# Patient Record
Sex: Male | Born: 1940 | Race: White | Hispanic: No | Marital: Married | Smoking: Former smoker
Health system: Southern US, Community
[De-identification: ages and names within clinical notes are randomized; demographics above are authoritative.]

## PROBLEM LIST (undated history)

## (undated) DIAGNOSIS — I10 Essential (primary) hypertension: Secondary | ICD-10-CM

## (undated) DIAGNOSIS — H269 Unspecified cataract: Secondary | ICD-10-CM

## (undated) DIAGNOSIS — T148XXA Other injury of unspecified body region, initial encounter: Secondary | ICD-10-CM

## (undated) DIAGNOSIS — M1990 Unspecified osteoarthritis, unspecified site: Secondary | ICD-10-CM

## (undated) DIAGNOSIS — C801 Malignant (primary) neoplasm, unspecified: Secondary | ICD-10-CM

## (undated) DIAGNOSIS — C44601 Unspecified malignant neoplasm of skin of unspecified upper limb, including shoulder: Secondary | ICD-10-CM

## (undated) DIAGNOSIS — E785 Hyperlipidemia, unspecified: Secondary | ICD-10-CM

## (undated) DIAGNOSIS — I861 Scrotal varices: Secondary | ICD-10-CM

## (undated) DIAGNOSIS — M109 Gout, unspecified: Secondary | ICD-10-CM

## (undated) HISTORY — DX: Unspecified osteoarthritis, unspecified site: M19.90

## (undated) HISTORY — DX: Malignant (primary) neoplasm, unspecified: C80.1

## (undated) HISTORY — DX: Unspecified malignant neoplasm of skin of unspecified upper limb, including shoulder: C44.601

## (undated) HISTORY — DX: Essential (primary) hypertension: I10

## (undated) HISTORY — DX: Gout, unspecified: M10.9

## (undated) HISTORY — DX: Unspecified cataract: H26.9

## (undated) HISTORY — DX: Hyperlipidemia, unspecified: E78.5

## (undated) HISTORY — DX: Scrotal varices: I86.1

## (undated) HISTORY — DX: Other injury of unspecified body region, initial encounter: T14.8XXA

---

## 1978-11-13 HISTORY — PX: TREATMENT FISTULA ANAL: SUR1390

## 1999-03-23 ENCOUNTER — Other Ambulatory Visit: Admission: RE | Admit: 1999-03-23 | Discharge: 1999-03-23 | Payer: Self-pay | Admitting: Internal Medicine

## 2003-04-02 ENCOUNTER — Encounter: Payer: Self-pay | Admitting: Internal Medicine

## 2003-04-02 ENCOUNTER — Encounter: Admission: RE | Admit: 2003-04-02 | Discharge: 2003-04-02 | Payer: Self-pay | Admitting: Internal Medicine

## 2005-02-09 ENCOUNTER — Ambulatory Visit: Payer: Self-pay | Admitting: Internal Medicine

## 2005-02-10 ENCOUNTER — Ambulatory Visit: Payer: Self-pay | Admitting: Internal Medicine

## 2005-04-24 ENCOUNTER — Ambulatory Visit: Payer: Self-pay | Admitting: Internal Medicine

## 2005-05-01 ENCOUNTER — Ambulatory Visit: Payer: Self-pay | Admitting: Internal Medicine

## 2005-09-04 ENCOUNTER — Ambulatory Visit: Payer: Self-pay | Admitting: Internal Medicine

## 2005-11-13 DIAGNOSIS — C801 Malignant (primary) neoplasm, unspecified: Secondary | ICD-10-CM

## 2005-11-13 HISTORY — DX: Malignant (primary) neoplasm, unspecified: C80.1

## 2005-12-12 ENCOUNTER — Encounter: Admission: RE | Admit: 2005-12-12 | Discharge: 2005-12-12 | Payer: Self-pay | Admitting: Urology

## 2005-12-14 HISTORY — PX: PROSTATE SURGERY: SHX751

## 2005-12-22 ENCOUNTER — Ambulatory Visit (HOSPITAL_COMMUNITY): Admission: RE | Admit: 2005-12-22 | Discharge: 2005-12-22 | Payer: Self-pay | Admitting: Urology

## 2005-12-25 ENCOUNTER — Ambulatory Visit: Payer: Self-pay | Admitting: Internal Medicine

## 2005-12-29 ENCOUNTER — Ambulatory Visit: Payer: Self-pay

## 2006-01-10 ENCOUNTER — Inpatient Hospital Stay (HOSPITAL_COMMUNITY): Admission: RE | Admit: 2006-01-10 | Discharge: 2006-01-11 | Payer: Self-pay | Admitting: Urology

## 2006-01-10 ENCOUNTER — Encounter (INDEPENDENT_AMBULATORY_CARE_PROVIDER_SITE_OTHER): Payer: Self-pay | Admitting: Specialist

## 2006-04-12 ENCOUNTER — Ambulatory Visit: Payer: Self-pay | Admitting: Internal Medicine

## 2006-06-04 ENCOUNTER — Ambulatory Visit: Payer: Self-pay | Admitting: Internal Medicine

## 2007-05-27 ENCOUNTER — Ambulatory Visit: Payer: Self-pay | Admitting: Endocrinology

## 2007-08-01 ENCOUNTER — Ambulatory Visit: Payer: Self-pay | Admitting: Internal Medicine

## 2007-08-01 LAB — CONVERTED CEMR LAB
ALT: 31 units/L (ref 0–53)
AST: 34 units/L (ref 0–37)
Albumin: 4.1 g/dL (ref 3.5–5.2)
Basophils Absolute: 0 10*3/uL (ref 0.0–0.1)
Bilirubin Urine: NEGATIVE
Calcium: 9.6 mg/dL (ref 8.4–10.5)
Chloride: 105 meq/L (ref 96–112)
Cholesterol: 251 mg/dL (ref 0–200)
Eosinophils Absolute: 0.1 10*3/uL (ref 0.0–0.6)
GFR calc Af Amer: 109 mL/min
GFR calc non Af Amer: 90 mL/min
Hemoglobin, Urine: NEGATIVE
Ketones, ur: NEGATIVE mg/dL
MCHC: 34.3 g/dL (ref 30.0–36.0)
MCV: 87.1 fL (ref 78.0–100.0)
Monocytes Relative: 9.9 % (ref 3.0–11.0)
Nitrite: NEGATIVE
PSA: 0 ng/mL — ABNORMAL LOW (ref 0.10–4.00)
Platelets: 183 10*3/uL (ref 150–400)
RBC: 5.09 M/uL (ref 4.22–5.81)
Sodium: 142 meq/L (ref 135–145)
TSH: 1.64 microintl units/mL (ref 0.35–5.50)
Total CHOL/HDL Ratio: 5.7
Triglycerides: 234 mg/dL (ref 0–149)
Urine Glucose: NEGATIVE mg/dL

## 2007-08-13 ENCOUNTER — Encounter: Payer: Self-pay | Admitting: Internal Medicine

## 2007-08-13 ENCOUNTER — Ambulatory Visit: Payer: Self-pay | Admitting: Internal Medicine

## 2007-08-13 DIAGNOSIS — I10 Essential (primary) hypertension: Secondary | ICD-10-CM

## 2007-08-13 DIAGNOSIS — M109 Gout, unspecified: Secondary | ICD-10-CM

## 2007-08-13 DIAGNOSIS — E785 Hyperlipidemia, unspecified: Secondary | ICD-10-CM

## 2007-08-13 HISTORY — DX: Hyperlipidemia, unspecified: E78.5

## 2007-08-13 HISTORY — DX: Gout, unspecified: M10.9

## 2007-08-13 HISTORY — DX: Essential (primary) hypertension: I10

## 2007-12-31 ENCOUNTER — Encounter: Payer: Self-pay | Admitting: Internal Medicine

## 2008-06-30 ENCOUNTER — Encounter: Payer: Self-pay | Admitting: Internal Medicine

## 2008-07-23 ENCOUNTER — Ambulatory Visit: Payer: Self-pay | Admitting: Internal Medicine

## 2008-07-23 DIAGNOSIS — I861 Scrotal varices: Secondary | ICD-10-CM

## 2008-07-23 HISTORY — DX: Scrotal varices: I86.1

## 2008-07-23 LAB — CONVERTED CEMR LAB
ALT: 31 units/L (ref 0–53)
Albumin: 4.5 g/dL (ref 3.5–5.2)
BUN: 14 mg/dL (ref 6–23)
CO2: 29 meq/L (ref 19–32)
Calcium: 9.7 mg/dL (ref 8.4–10.5)
Cholesterol: 205 mg/dL (ref 0–200)
Creatinine, Ser: 0.8 mg/dL (ref 0.4–1.5)
Direct LDL: 99.3 mg/dL
Total Bilirubin: 1.1 mg/dL (ref 0.3–1.2)
Total CHOL/HDL Ratio: 4.1
Total Protein: 7.7 g/dL (ref 6.0–8.3)
Triglycerides: 250 mg/dL (ref 0–149)

## 2008-07-27 ENCOUNTER — Encounter: Payer: Self-pay | Admitting: Internal Medicine

## 2008-08-03 ENCOUNTER — Encounter: Admission: RE | Admit: 2008-08-03 | Discharge: 2008-08-03 | Payer: Self-pay | Admitting: Internal Medicine

## 2008-08-04 ENCOUNTER — Telehealth: Payer: Self-pay | Admitting: Internal Medicine

## 2008-08-04 ENCOUNTER — Encounter: Payer: Self-pay | Admitting: Internal Medicine

## 2008-08-06 ENCOUNTER — Encounter: Payer: Self-pay | Admitting: Internal Medicine

## 2008-08-13 ENCOUNTER — Encounter: Admission: RE | Admit: 2008-08-13 | Discharge: 2008-08-13 | Payer: Self-pay | Admitting: Internal Medicine

## 2008-08-17 ENCOUNTER — Encounter: Payer: Self-pay | Admitting: Internal Medicine

## 2008-10-21 ENCOUNTER — Encounter: Payer: Self-pay | Admitting: Internal Medicine

## 2008-10-23 ENCOUNTER — Encounter: Payer: Self-pay | Admitting: Internal Medicine

## 2008-11-13 HISTORY — PX: HERNIA REPAIR: SHX51

## 2008-11-23 ENCOUNTER — Encounter: Payer: Self-pay | Admitting: Internal Medicine

## 2009-01-01 ENCOUNTER — Encounter: Payer: Self-pay | Admitting: Internal Medicine

## 2009-01-07 ENCOUNTER — Ambulatory Visit (HOSPITAL_COMMUNITY): Admission: RE | Admit: 2009-01-07 | Discharge: 2009-01-07 | Payer: Self-pay | Admitting: General Surgery

## 2009-01-25 ENCOUNTER — Encounter: Payer: Self-pay | Admitting: Internal Medicine

## 2009-02-04 ENCOUNTER — Encounter: Payer: Self-pay | Admitting: Internal Medicine

## 2009-07-07 ENCOUNTER — Encounter: Payer: Self-pay | Admitting: Internal Medicine

## 2009-08-30 ENCOUNTER — Telehealth: Payer: Self-pay | Admitting: Internal Medicine

## 2009-08-31 ENCOUNTER — Telehealth: Payer: Self-pay | Admitting: Internal Medicine

## 2009-09-30 ENCOUNTER — Ambulatory Visit: Payer: Self-pay | Admitting: Internal Medicine

## 2009-09-30 LAB — CONVERTED CEMR LAB
ALT: 41 units/L (ref 0–53)
AST: 40 units/L — ABNORMAL HIGH (ref 0–37)
Alkaline Phosphatase: 61 units/L (ref 39–117)
BUN: 12 mg/dL (ref 6–23)
Bilirubin, Direct: 0.2 mg/dL (ref 0.0–0.3)
Calcium: 9.8 mg/dL (ref 8.4–10.5)
Cholesterol: 196 mg/dL (ref 0–200)
Creatinine, Ser: 1 mg/dL (ref 0.4–1.5)
GFR calc non Af Amer: 78.93 mL/min (ref >60)
Glucose, Bld: 91 mg/dL (ref 70–99)
TSH: 1.25 microintl units/mL (ref 0.35–5.50)
Total Bilirubin: 1.1 mg/dL (ref 0.3–1.2)
Total Protein: 7.4 g/dL (ref 6.0–8.3)
VLDL: 28 mg/dL (ref 0.0–40.0)

## 2010-01-25 ENCOUNTER — Encounter: Payer: Self-pay | Admitting: Internal Medicine

## 2010-04-07 ENCOUNTER — Encounter: Payer: Self-pay | Admitting: Internal Medicine

## 2010-07-14 HISTORY — PX: COLONOSCOPY: SHX174

## 2010-07-15 ENCOUNTER — Encounter (INDEPENDENT_AMBULATORY_CARE_PROVIDER_SITE_OTHER): Payer: Self-pay

## 2010-07-19 ENCOUNTER — Ambulatory Visit: Payer: Self-pay | Admitting: Internal Medicine

## 2010-07-27 ENCOUNTER — Encounter: Payer: Self-pay | Admitting: Internal Medicine

## 2010-07-28 ENCOUNTER — Ambulatory Visit: Payer: Self-pay | Admitting: Internal Medicine

## 2010-07-28 LAB — HM COLONOSCOPY

## 2010-07-29 ENCOUNTER — Encounter: Payer: Self-pay | Admitting: Internal Medicine

## 2010-08-30 ENCOUNTER — Ambulatory Visit: Payer: Self-pay | Admitting: Internal Medicine

## 2010-10-03 ENCOUNTER — Encounter: Payer: Self-pay | Admitting: Internal Medicine

## 2010-10-03 ENCOUNTER — Ambulatory Visit: Payer: Self-pay | Admitting: Internal Medicine

## 2010-10-05 ENCOUNTER — Ambulatory Visit (HOSPITAL_COMMUNITY)
Admission: RE | Admit: 2010-10-05 | Discharge: 2010-10-05 | Payer: Self-pay | Source: Home / Self Care | Admitting: Internal Medicine

## 2010-10-08 ENCOUNTER — Encounter: Payer: Self-pay | Admitting: Internal Medicine

## 2010-12-04 ENCOUNTER — Encounter: Payer: Self-pay | Admitting: General Surgery

## 2010-12-13 NOTE — Letter (Signed)
   [facility] [address] [place], [place]  [address] Phone: [phone]      October 09, 2010   DREDEN RIVERE [address] [place], [place] [address]  RE:  LAB RESULTS  Dear  Mr. Snelling,  The following is an interpretation of your most recent lab tests.  Please take note of any instructions provided or changes to medications that have resulted from your lab work.    CT scan of neck and abdomen/pelvis were normal. Full report attached.  Please come see me if you have any questions about these lab results.   Sincerely Yours,    Jacques Navy MD  T ABD/PELVIS WO CM - [phone]   Clinical Data:  Right neck pain and swelling.  Prostate cancer.   CT NECK WITH CONTRAST   Technique:  Multidetector CT imaging of the neck was performed using the standard protocol following the bolus administration of intravenous contrast.   Contrast: 100 ml Omnipaque-300 IV   Comparison:  None   Findings: Negative for mass lesion.  Negative for adenopathy. Submandibular and parotid glands are normal bilaterally.  No soft tissue edema or abscess is present.  Pharyngeal soft tissues are normal.  Larynx and thyroid are normal.  Lung apices are clear with azygos lobe fissure noted.  Cervical disc degeneration and spondylosis.  No acute bony abnormality.   IMPRESSION: No significant abnormality.  No mass or adenopathy.     Clinical Data:  Evaluate kidney stones.  Right flank pain and hematuria.  Prostate cancer.   CT ABDOMEN AND PELVIS WITHOUT CONTRAST   Technique:  Multidetector CT imaging of the abdomen and pelvis was performed following the standard protocol without intravenous contrast.   Comparison:  None.   Findings:  Negative for urinary tract calculi.  No hydronephrosis or renal mass.   Lung bases are clear.  Liver and gallbladder normal.  Pancreas and spleen are normal.   Negative for bowel obstruction.  The appendix is normal.  No free fluid  or adenopathy.   Prostatectomy is noted.   IMPRESSION: Negative for renal calculi.  No acute abnormality.   Read By:  Camelia Phenes,  Judie Petit.D.

## 2010-12-13 NOTE — Assessment & Plan Note (Signed)
Summary: GOLF BALL HIT LEG--BRUISE MOVE DOWN TO FOOT--STC   Vital Signs:  Patient profile:   70 year old male Height:      70 inches Weight:      201 pounds BMI:     28.94 O2 Sat:      98 % on Room air Temp:     97.8 degrees F oral Pulse rate:   71 / minute BP sitting:   138 / 82  (left arm) Cuff size:   regular  Vitals Entered By: Bill Salinas CMA (August 30, 2010 4:04 PM)  O2 Flow:  Room air CC: pt here for evaluation of bruising that is moving down his left leg after being hit with a golf ball/a b   Primary Care Provider:  Norins  CC:  pt here for evaluation of bruising that is moving down his left leg after being hit with a golf ball/a b.  History of Present Illness: Patient is seen acutely due to bruising of the left ankle. He was struck by a golf ball left mid-shin sustaining a hematoma with subsequent bruising in the surrounding tissue. Today he noticed bruising at the medial aspect of his ankle. His concern is for DVT or other serious injury. He has minimal pain, no swelling in the calve, normal range of motion ankle.  Current Medications (verified): 1)  Prinivil 10 Mg  Tabs (Lisinopril) .... Once Daily 2)  Aspirin 81 Mg  Tbec (Aspirin) .... Once Daily 3)  Multivitamins   Tabs (Multiple Vitamin) .... Once Daily 4)  Ibuprofen 200 Mg  Caps (Ibuprofen) .... As Needed 5)  Indomethacin 25 Mg Caps (Indomethacin) .Marland Kitchen.. 1 Q 8 Hrs As Needed Gout Pain / Needs Office Visit 6)  Lovastatin 20 Mg  Tabs (Lovastatin) .Marland Kitchen.. 1 By Mouth Once Daily  Allergies (verified): 1)  ! Iodine PMH-FH-SH reviewed-no changes except otherwise noted  Review of Systems  The patient denies fever, chest pain, dyspnea on exertion, muscle weakness, suspicious skin lesions, difficulty walking, and enlarged lymph nodes.    Physical Exam  General:  Well-developed,well-nourished,in no acute distress; alert,appropriate and cooperative throughout examination Msk:  left leg appears normal Skin:  palpable  hematoma left shin with surrounding old bruise. Blue echymosis at medial aspect of left ankle.   Impression & Recommendations:  Problem # 1:  CONTUSION, LOWER LEG, LEFT (ICD-924.10) blunt trauma by golf ball with hematoma and bruising. Now with extension by dissection of skin plane to the ankle. Neg Homan's sign.  Plan - reassurance           heat to bruised area.  Complete Medication List: 1)  Prinivil 10 Mg Tabs (Lisinopril) .... Once daily 2)  Aspirin 81 Mg Tbec (Aspirin) .... Once daily 3)  Multivitamins Tabs (Multiple vitamin) .... Once daily 4)  Ibuprofen 200 Mg Caps (Ibuprofen) .... As needed 5)  Indomethacin 25 Mg Caps (Indomethacin) .Marland Kitchen.. 1 q 8 hrs as needed gout pain / needs office visit 6)  Lovastatin 20 Mg Tabs (Lovastatin) .Marland Kitchen.. 1 by mouth once daily   Orders Added: 1)  Est. Patient Level II [540[address]]

## 2010-12-13 NOTE — Letter (Signed)
Summary: [facility] Instructions  [facility]  [address] [place], [place] [address]   Phone: [phone]  Fax: [phone]       Troy Jenkins    03-Mar-1941    MRN: [id number]        Procedure Day /Date:  Thursday 07/28/2010     Arrival Time: 9:00 am      Procedure Time: 10:00 am     Location of Procedure:                    _x _  [facility] (4th Floor)                        PREPARATION FOR COLONOSCOPY WITH MOVIPREP   Starting 5 days prior to your procedure Saturday 9/10 do not eat nuts, seeds, popcorn, corn, beans, peas,  salads, or any raw vegetables.  Do not take any fiber supplements (e.g. Metamucil, Citrucel, and Benefiber).  THE DAY BEFORE YOUR PROCEDURE         DATE: Wednesday 9/14  1.  Drink clear liquids the entire day-NO SOLID FOOD  2.  Do not drink anything colored red or purple.  Avoid juices with pulp.  No orange juice.  3.  Drink at least 64 oz. (8 glasses) of fluid/clear liquids during the day to prevent dehydration and help the prep work efficiently.  CLEAR LIQUIDS INCLUDE: Water Jello Ice Popsicles Tea (sugar ok, no milk/cream) Powdered fruit flavored drinks Coffee (sugar ok, no milk/cream) Gatorade Juice: apple, white grape, white cranberry  Lemonade Clear bullion, consomm, broth Carbonated beverages (any kind) Strained chicken noodle soup Hard Candy                             4.  In the morning, mix first dose of MoviPrep solution:    Empty 1 Pouch A and 1 Pouch B into the disposable container    Add lukewarm drinking water to the top line of the container. Mix to dissolve    Refrigerate (mixed solution should be used within 24 hrs)  5.  Begin drinking the prep at 5:00 p.m. The MoviPrep container is divided by 4 marks.   Every 15 minutes drink the solution down to the next mark (approximately 8 oz) until the full liter is complete.   6.  Follow completed prep with 16 oz of clear liquid of your choice  (Nothing red or purple).  Continue to drink clear liquids until bedtime.  7.  Before going to bed, mix second dose of MoviPrep solution:    Empty 1 Pouch A and 1 Pouch B into the disposable container    Add lukewarm drinking water to the top line of the container. Mix to dissolve    Refrigerate  THE DAY OF YOUR PROCEDURE      DATE: Thursday 9/15  Beginning at 5:00 a.m. (5 hours before procedure):         1. Every 15 minutes, drink the solution down to the next mark (approx 8 oz) until the full liter is complete.  2. Follow completed prep with 16 oz. of clear liquid of your choice.    3. You may drink clear liquids until 8:00 am (2 HOURS BEFORE PROCEDURE).   MEDICATION INSTRUCTIONS  Unless otherwise instructed, you should take regular prescription medications with a small sip of water   as early as possible the morning of  your procedure.          OTHER INSTRUCTIONS  You will need a responsible adult at least 70 years of age to accompany you and drive you home.   This person must remain in the waiting room during your procedure.  Wear loose fitting clothing that is easily removed.  Leave jewelry and other valuables at home.  However, you may wish to bring a book to read or  an iPod/MP3 player to listen to music as you wait for your procedure to start.  Remove all body piercing jewelry and leave at home.  Total time from sign-in until discharge is approximately 2-3 hours.  You should go home directly after your procedure and rest.  You can resume normal activities the  day after your procedure.  The day of your procedure you should not:   Drive   Make legal decisions   Operate machinery   Drink alcohol   Return to work  You will receive specific instructions about eating, activities and medications before you leave.    The above instructions have been reviewed and explained to me by   Ulis Rias RN  July 19, 2010 9:19 AM     I fully understand  and can verbalize these instructions _____________________________ Date _________

## 2010-12-13 NOTE — Assessment & Plan Note (Signed)
Summary: CPX-LB   Vital Signs:  Patient profile:   70 year old male Height:      70 inches Weight:      200 pounds BMI:     28.80 Temp:     97.9 degrees F oral Pulse rate:   64 / minute Pulse rhythm:   regular BP sitting:   156 / 92  (left arm) Cuff size:   regular  Vitals Entered By: Lamar Sprinkles, CMA (October 03, 2010 9:44 AM) CC: Medicare Wellness   Primary Care Provider:  Norins  CC:  Medicare Wellness.  History of Present Illness: Patient presents for a medicare wellness exam. He has several concerns.  He has a posterior cerivcal chain lymph node that is chronically tender and has not receeded over several months. He is interested in having this worked up. He has had no night sweats, no weight loss, no other lymphadenopathy.  May 26th - seen at the [place]. On the way there he had severe pain in the low back right. He did have a lot of blood in the urine that day. He had another episode of pain 2 weeks ago. This feels like previous kidney stone.  Has a knot on the left scapula - has been there for a while, non tender.   Patient is 100% indepnendent in all ADLs. 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 He has had no falls and has no fall risk. He has no signs or symptoms of depression.    Preventive Screening-Counseling & Management  Alcohol-Tobacco     Alcohol drinks/day: <1     Alcohol type: wine     >5/day in last 3 mos: no     Smoking Status: quit > 6 months     Packs/Day: 1.0     Year Quit: 1969  Caffeine-Diet-Exercise     Caffeine use/day: 2.5     Diet Comments: heart healthy diet     Diet Counseling: not indicated; diet is assessed to be healthy     Does Patient Exercise: yes     Type of exercise: golf, walks     Exercise (avg: min/session): >60     Times/week: 3     Exercise Counseling: not indicated; exercise is adequate  Hep-HIV-STD-Contraception     Hepatitis Risk: no risk noted     HIV  Risk: no risk noted     STD Risk: no risk noted     Dental Visit-last 6 months yes     Sun Exposure-Excessive: yes  Safety-Violence-Falls     Seat Belt Use: yes     Helmet Use: yes     Firearms in the Home: firearms in the home     Smoke Detectors: yes     Violence in the Home: no risk noted     Fall Risk: NO      Sexual History:  currently monogamous.        Drug Use:  never.        Blood Transfusions:  no.    Current Medications (verified): 1)  Prinivil 10 Mg  Tabs (Lisinopril) .... Once Daily 2)  Aspirin 81 Mg  Tbec (Aspirin) .... Once Daily 3)  Multivitamins   Tabs (Multiple Vitamin) .... Once Daily 4)  Ibuprofen 200 Mg  Caps (Ibuprofen) .... As Needed 5)  Indomethacin 25 Mg Caps (Indomethacin) .Marland Kitchen.. 1 Q 8 Hrs As Needed Gout Pain / Needs Office Visit 6)  Lovastatin 20 Mg  Tabs (Lovastatin) .Marland Kitchen.. 1 By Mouth Once Daily  Allergies (verified): 1)  ! 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  Social History: Smoking Status:  quit > 6 months Packs/Day:  1.0 Caffeine use/day:  2.5 Does Patient Exercise:  yes Dental Care w/in 6 mos.:  yes Sun Exposure-Excessive:  yes Seat Belt Use:  yes Fall Risk:  NO Hepatitis Risk:  no risk noted HIV Risk:  no risk noted STD Risk:  no risk noted Sexual History:  currently monogamous Drug Use:  never Blood Transfusions:  no  Review of Systems  The patient denies anorexia, fever, weight loss, weight gain, vision loss, hoarseness,  chest pain, syncope, peripheral edema, prolonged cough, hemoptysis, abdominal pain, hematochezia, incontinence, muscle weakness, difficulty walking, abnormal bleeding, enlarged lymph nodes, and angioedema.    Physical Exam  General:  Well-developed,well-nourished,in no acute distress; alert,appropriate and cooperative throughout examination Head:  Normocephalic and atraumatic without obvious abnormalities. No apparent alopecia or balding. Eyes:  No corneal or conjunctival inflammation noted. EOMI. Perrla. Funduscopic exam benign, without hemorrhages, exudates or papilledema. Vision grossly normal. Ears:  External ear exam shows no significant lesions or deformities.  Otoscopic examination reveals clear canals, tympanic membranes are intact bilaterally without bulging, retraction, inflammation or discharge. Hearing is grossly normal bilaterally. Nose:  no external deformity and no external erythema.   Mouth:  Oral mucosa and oropharynx without lesions or exudates.  Teeth in good repair. Neck:  supple, full ROM, no thyromegaly, and no carotid bruits.   Chest Wall:  No deformities, masses, tenderness or gynecomastia noted. Lungs:  Normal respiratory effort, chest expands symmetrically. Lungs are clear to auscultation, no crackles or wheezes. Heart:  Normal rate and regular rhythm. S1 and S2 normal without gallop, murmur, click, rub or other extra sounds. Abdomen:  soft, non-tender, normal bowel sounds, no distention, no masses, no guarding, and no rigidity. No tenderness over the flanks.   Prostate:  deferred to prostatectomy Msk:  normal ROM, no joint tenderness, no joint swelling, no joint warmth, and no joint instability.   Pulses:  2+ radial and DPD pulses Extremities:  No clubbing, cyanosis, edema, or deformity noted with normal full range of motion of all joints.   Neurologic:  alert & oriented X3, cranial nerves II-XII intact, strength normal in all extremities, gait normal, and DTRs  symmetrical and normal.   Skin:  turgor normal, color normal, no rashes, no suspicious lesions, and no ulcerations.  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 No additional adenopathy appreciated.  Axillary Nodes:  no R axillary adenopathy and no L axillary adenopathy.   Psych:  Oriented X3, memory intact for recent and remote, normally interactive, good eye contact, and not anxious appearing.     Impression & Recommendations:  Problem # 1:  ACUTE LYMPHADENITIS (ICD-683) Patient with a persistently enlarged lymph node posteror cervical chain right. He has had no night sweats, weight loss or other systemic symptoms.  Plan - CT neck  f/u with  ENT for possible needle or excisional biopsy. Orders: Radiology Referral (Radiology)  Problem # 2:  FLANK PAIN, RIGHT (ICD-789.09) Patient with a h/o nephrolithiasis. HE has had two episodoes of renal colic type pain on the right and hematuria.  Plan - CT abd/pelvis with kidney stone protocol.           May refer to GU if stone present.   His updated medication list for this problem includes:    Aspirin 81 Mg Tbec (Aspirin) ..... Once daily    Ibuprofen 200 Mg Caps (Ibuprofen) .Marland Kitchen... As needed    Indomethacin 25 Mg Caps (Indomethacin) .Marland Kitchen... 1 q 8 hrs as needed gout pain / needs office visit  Problem # 3:  HYPERTENSION (ICD-401.9)  His updated medication list for this problem includes:    Prinivil 10 Mg Tabs (Lisinopril) ..... Once daily  BP today: 156/92 Prior BP: 138/82 (08/30/2010)  Patient has been previously well controlled.  Plan - continue present medication           monitor BP at home and report back. Recommendations to follow  Problem # 4:  GOUT (ICD-274.9) Patient did have a flare of knee pain. He was treated with colchicine. He has not had a recent uric acid level. He has not had more than 2 episodes in 12  months.  Plan - treat acute flares with NSAIDs          for continue flares will need uric acid level and consider allopurinol or Uloric.   Problem # 5:  HYPERLIPIDEMIA (ICD-272.4)  His updated medication list for this problem includes:    Lovastatin 20 Mg Tabs (Lovastatin) .Marland Kitchen... 1 by mouth once daily  Good control on present dose of miedication. Will contginue the same.   Problem # 6:  Preventive Health Care (ICD-V70.0)  Interval health history is remarkable for episodes of flank pain as noted which will be evaluated as described. 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 He is current with colorectal cancer screening with last colonoscopy Sept '11. Immunizatrions - tetnus Nov '120. He is given a prescription for Shingles vaccine. He is a candidate for pneumonia vaccine if not done already. Reveiwed labs from [place] which were all normal including lipid panel, A1C. 12 Lead EKG is negative of ischemia or injury.   In summary - a very nice gentleman who appears medically stable but needs further evaluation of lymphadenitis and of potential renal calculus. He will be notified of his test results when available.   Orders: Medicare -1st Annual Wellness Visit [phone])  Complete Medication List: 1)  Prinivil 10 Mg Tabs (Lisinopril) .... Once daily 2)  Aspirin 81 Mg Tbec (Aspirin) .... Once daily 3)  Multivitamins Tabs (Multiple vitamin) .... Once daily 4)  Ibuprofen 200 Mg Caps (Ibuprofen) .... As needed 5)  Indomethacin 25 Mg Caps (Indomethacin) .Marland Kitchen.. 1 q 8 hrs as needed gout pain / needs office visit 6)  Lovastatin 20 Mg Tabs (Lovastatin) .Marland Kitchen.. 1 by mouth once daily Prescriptions: LOVASTATIN 20 MG  TABS (LOVASTATIN) 1 by mouth once daily  #90 x 3   Entered and Authorized by:   Jacques Navy MD   Signed by:   Jacques Navy MD on 10/03/2010   Method used:   Electronically to        [facility] [address] [facility] (retail)       [address]  [place], [place]  [address]       Ph: [phone]       Fax: [phone]   RxID:   [id number] INDOMETHACIN 25 MG CAPS (INDOMETHACIN) 1 q 8 hrs as needed gout pain / NEEDS office visit  #30 x 12   Entered and Authorized by:   Jacques Navy MD   Signed by:   Jacques Navy MD on 10/03/2010   Method used:   Electronically to        [facility] [address] [facility] (retail)       [address]       [place], [place]  [address]       Ph: [phone]       Fax: [phone]   RxID:   [id number] PRINIVIL 10 MG  TABS (LISINOPRIL) once daily  #90 Each x 3   Entered and Authorized by:   Jacques Navy MD   Signed by:   Jacques Navy MD on 10/03/2010   Method used:   Electronically to        [facility] [address] [facility] (retail)       [address]       [place], [place]  [address]       Ph: [phone]       Fax: [phone]   RxID:   [id number]    Orders Added: 1)  Radiology Referral [Radiology] 2)  Radiology Referral [Radiology] 3)  Medicare -1st Annual Wellness Visit [G0438] 4)  Est. Patient Level III [269[address]]

## 2010-12-13 NOTE — Miscellaneous (Signed)
Summary: Lec previsit  Clinical Lists Changes  Medications: Added new medication of MOVIPREP 100 GM  SOLR (PEG-KCL-NACL-NASULF-NA ASC-C) As per prep instructions. - Signed Rx of MOVIPREP 100 GM  SOLR (PEG-KCL-NACL-NASULF-NA ASC-C) As per prep instructions.;  #1 x 0;  Signed;  Entered by: Ulis Rias RN;  Authorized by: Hilarie Fredrickson MD;  Method used: Electronically to [facility] [address], [place], [place], [place]  [address], Ph: [phone], Fax: [phone] Observations: Added new observation of ALLERGY REV: Done (07/19/2010 8:45)    Prescriptions: MOVIPREP 100 GM  SOLR (PEG-KCL-NACL-NASULF-NA ASC-C) As per prep instructions.  #1 x 0   Entered by:   Ulis Rias RN   Authorized by:   Hilarie Fredrickson MD   Signed by:   Ulis Rias RN on 07/19/2010   Method used:   Electronically to        [facility] (retail)       [address]       [place], [place]  [address]       Ph: [phone]       Fax: [phone]   RxID:   [id number]

## 2010-12-13 NOTE — Procedures (Signed)
Summary: Colonoscopy  Patient: Ameya Kutz Note: All result statuses are Final unless otherwise noted.  Tests: (1) Colonoscopy (COL)   COL Colonoscopy           DONE     [facility]     [address]     [place], [place]  [address]           COLONOSCOPY PROCEDURE REPORT           PATIENT:  Clancey, Welton  MR#:  [id number]     BIRTHDATE:  06-12-1941, 69 yrs. old  GENDER:  male     ENDOSCOPIST:  Wilhemina Bonito. Eda Keys, MD     REF. BY:  Screening Recall     PROCEDURE DATE:  07/28/2010     PROCEDURE:  Colonoscopy with snare polypectomy x 1     ASA CLASS:  Class II     INDICATIONS:  Routine Risk Screening ; negative index exam 2000     MEDICATIONS:   Fentanyl 75 mcg IV, Versed 9 mg IV           DESCRIPTION OF PROCEDURE:   After the risks benefits and     alternatives of the procedure were thoroughly explained, informed     consent was obtained.  Digital rectal exam was performed and     revealed no abnormalities.   The LB CF-[id number] endoscope     was introduced through the anus and advanced to the cecum, which     was identified by both the appendix and ileocecal valve, without     limitations.Time to cecum = 3:03 min.  The quality of the prep was     excellent, using MoviPrep.  The instrument was then slowly     withdrawn (time = 10:25 min) as the colon was fully examined.     <<PROCEDUREIMAGES>>           FINDINGS:  A diminutive polyp was found in the ascending colon.     Polyp was snared without cautery. Retrieval was successful.     Severe diverticulosis was found in the sigmoid colon.   Retroflexed     views in the rectum revealed internal hemorrhoids.    The scope     was then withdrawn from the patient and the procedure completed.           COMPLICATIONS:  None     ENDOSCOPIC IMPRESSION:     1) Diminutive polyp in the ascending colon - removed     2) Severe diverticulosis in the sigmoid colon     3) Internal hemorrhoids           RECOMMENDATIONS:     1)  Repeat colonoscopy in 5 years if polyp adenomatous; otherwise     10 years           ______________________________     Wilhemina Bonito. Eda Keys, MD           CC:  Jacques Navy, MD; The Patient           n.     eSIGNED:   Wilhemina Bonito. Eda Keys at 07/28/2010 11:06 AM           Rene Kocher, [phone]  Note: An exclamation mark (!) indicates a result that was not dispersed into the flowsheet. Document Creation Date: 07/28/2010 11:07 AM _______________________________________________________________________  (1) Order result status: Final Collection or observation date-time: 07/28/2010 10:57 Requested date-time:  Receipt date-time:  Reported date-time:  Referring Physician:   Ordering Physician: Fransico Setters [phone]) Specimen Source:  Source: Launa Grill Order Number: [id number] Lab site:   Appended Document: Colonoscopy recall     Procedures Next Due Date:    Colonoscopy: 07/2015

## 2010-12-13 NOTE — Letter (Signed)
Summary: [facility]  [facility]   Imported By: Lennie Odor 08/01/2010 15:14:52  _____________________________________________________________________  External Attachment:    Type:   Image     Comment:   External Document

## 2010-12-13 NOTE — Letter (Signed)
Summary: [facility]   Imported By: Sherian Rein 02/07/2010 12:24:34  _____________________________________________________________________  External Attachment:    Type:   Image     Comment:   External Document

## 2010-12-13 NOTE — Letter (Signed)
Summary: Patient Notice- Polyp Results   Gastroenterology  [address] [place], [place] [address]   Phone: [phone]  Fax: [phone]        July 29, 2010 MRN: [id number]    Troy Jenkins [address] [place], [place]  [address]    Dear Mr. [name],  I am pleased to inform you that the colon polyp(s) removed during your recent colonoscopy was (were) found to be benign (no cancer detected) upon pathologic examination.  I recommend you have a repeat colonoscopy examination in 5 years to look for recurrent polyps, as having colon polyps increases your risk for having recurrent polyps or even colon cancer in the future.  Should you develop new or worsening symptoms of abdominal pain, bowel habit changes or bleeding from the rectum or bowels, please schedule an evaluation with either your primary care physician or with me.  Additional information/recommendations:  __ No further action with gastroenterology is needed at this time. Please      follow-up with your primary care physician for your other healthcare      needs.    Please call us if you are having persistent problems or have questions about your condition that have not been fully answered at this time.  Sincerely,  Hilarie Fredrickson MD  This letter has been electronically signed by your physician.  Appended Document: Patient Notice- Polyp Results letter mailed

## 2011-01-24 ENCOUNTER — Encounter: Payer: Self-pay | Admitting: Internal Medicine

## 2011-02-09 NOTE — Letter (Signed)
Summary: [facility]   Imported By: Sherian Rein 01/30/2011 08:47:09  _____________________________________________________________________  External Attachment:    Type:   Image     Comment:   External Document

## 2011-02-28 LAB — COMPREHENSIVE METABOLIC PANEL
ALT: 49 U/L (ref 0–53)
Albumin: 3.8 g/dL (ref 3.5–5.2)
Alkaline Phosphatase: 62 U/L (ref 39–117)
Calcium: 10 mg/dL (ref 8.4–10.5)
Glucose, Bld: 106 mg/dL — ABNORMAL HIGH (ref 70–99)
Potassium: 4.7 mEq/L (ref 3.5–5.1)
Sodium: 140 mEq/L (ref 135–145)
Total Protein: 6.8 g/dL (ref 6.0–8.3)

## 2011-02-28 LAB — CBC
MCHC: 34.4 g/dL (ref 30.0–36.0)
Platelets: 170 10*3/uL (ref 150–400)
RDW: 13 % (ref 11.5–15.5)

## 2011-02-28 LAB — DIFFERENTIAL
Basophils Relative: 1 % (ref 0–1)
Lymphs Abs: 1.2 10*3/uL (ref 0.7–4.0)
Monocytes Absolute: 0.4 10*3/uL (ref 0.1–1.0)
Monocytes Relative: 10 % (ref 3–12)
Neutro Abs: 2.4 10*3/uL (ref 1.7–7.7)
Neutrophils Relative %: 58 % (ref 43–77)

## 2011-03-28 NOTE — Op Note (Signed)
NAMESWAIN, ACREE              ACCOUNT NO.:  [id number]   MEDICAL RECORD NO.:  [id number]          PATIENT TYPE:  AMB   LOCATION:  SDS                          FACILITY:  [facility]   PHYSICIAN:  Adolph Pollack, M.D.DATE OF BIRTH:  Mar 30, 1941   DATE OF PROCEDURE:  DATE OF DISCHARGE:  01/07/2009                               OPERATIVE REPORT   PREOPERATIVE DIAGNOSIS:  Left inguinal hernia.   POSTOPERATIVE DIAGNOSIS:  Left inguinal hernia.   PROCEDURE:  Left inguinal hernia.   SURGEON:  Adolph Pollack, MD   ANESTHESIA:  General by way of LMA and local (Marcaine).   INDICATIONS:  This is a 70 year old male with increasing left groin  discomfort.  He has a left inguinal hernia on exam that is reducible and  now he presents for repair.  The procedure risks and aftercare were  discussed with him preoperatively.   TECHNIQUE:  He was seen in the holding area.  The left groin was marked  with my initials.  He was then brought to the operating room, placed  supine on the operating table, and general anesthetic was administered.  The hair in the left groin was clipped and the area was sterilely  prepped and draped.  Local anesthetic was infiltrated superficially and  deep in the left groin.  The left groin incision was made through the  skin and subcutaneous tissue until the external oblique aponeurosis was  identified.  Local anesthetic was then infiltrated deep to the external  oblique aponeurosis.  An incision was made in the external oblique  aponeurosis through the external ring medially and up toward the  anterior-superior iliac spine laterally.  Using blunt dissection, the  shelving edge of the inguinal ligament was exposed inferiorly and the  internal oblique muscle and aponeurosis were exposed superiorly.   After spermatic cord was isolated, an extraperitoneal fat was densely  adherent to the spermatic cord and had to be dissected free using sharp  dissection and blunt  dissection with electrocautery.  Some of the  extraperitoneal fat coming through the indirect hernia defect was  excised.  The rest was reduced through the patulous internal ring.   Following this, I retracted the cord anteriorly and anchored a piece of  3 x 6-inch polypropylene mesh 2 cm medial to the pubic tubercle with 2-0  Prolene suture.  The inferior aspect of the mesh was then anchored to  the shelving edge of the inguinal ligament.  A running 2-0 Prolene  suture up to 0.2 cm lateral to the internal ring.  A partial  longitudinal slit was then cut into the mesh and 2 tails were created,  was then wrapped around the spermatic cord.  The superior aspect of the  mesh was then anchored to the internal oblique aponeurosis with  interrupted 2-0 Vicryl sutures.  The 2 tails of the mesh were then  crossed creating a new internal ring and these were anchored to the  shelving edge of the inguinal ligament with the 2-0 Prolene suture.  The  tip of a hemostat could be placed to the new aperture.  Following this, I inspected the area.  The ilioinguinal nerve was  preserved.  Hemostasis was adequate.  The external oblique aponeurosis  was then closed over the mesh and the cord with a running 3-0 Vicryl  suture after the lateral aspect of the mesh was tucked deep to it.  The  subcutaneous tissue was then closed with a running 2-0 Vicryl suture.  The skin was closed with a 4-0 Monocryl subcuticular stitch followed by  Steri-Strips and sterile dressing.   He tolerated the procedure well without any apparent complications.  The  left testicle was in normal position in the scrotum.  He was taken to  recovery room in satisfactory condition.      Adolph Pollack, M.D.  Electronically Signed     TJR/MEDQ  D:  01/07/2009  T:  01/08/2009  Job:  [id number]   cc:   Rosalyn Gess. Norins, MD

## 2011-03-31 NOTE — Discharge Summary (Signed)
NAME:  GREYSYN, VANDERBERG NO.:  [id number]   MEDICAL RECORD NO.:  [id number]           PATIENT TYPE:   LOCATION:                                 FACILITY:   PHYSICIAN:  Heloise Purpura, MD           DATE OF BIRTH:   DATE OF ADMISSION:  01/10/2006  DATE OF DISCHARGE:  01/11/2006                                 DISCHARGE SUMMARY   ADMISSION DIAGNOSIS:  Clinically localized adenocarcinoma of the prostate.   DISCHARGE DIAGNOSIS:  Clinically localized adenocarcinoma of the prostate.   PROCEDURES:  1.  Robotic assisted laparoscopic radical prostatectomy.  2.  Bilateral pelvic lymphadenectomy   HISTORY AND PHYSICAL:  For full details please see admission history and  physical. Briefly, Mr. Guymon is a 70 year old gentleman with clinical  stage T1C prostate cancer with a Gleason score of 3 + 4 equals 7 and a PSA  of 4.93. After discussing management options for clinically localized  prostate cancer, the patient elected to proceed with surgical therapy.   HOSPITAL COURSE:  On January 10, 2006, the patient underwent an uneventful  robotic assisted laparoscopic radical prostatectomy with bilateral pelvic  lymphadenectomy. Following recovery from anesthesia, he was able to be  transferred to a regular hospital room. He was monitored over the course of  24 hours; and was able to begin a clear liquid diet which he tolerated  without problems. He was also able to begin ambulating without difficulty.  On postoperative day #1, he had excellent urine output with minimal output  from his pelvic drain. Therefore, his pelvic drain was able to be removed.  He was subsequently able to be transfer transitioned over to oral pain  medications; and was able to be discharged home in excellent condition, on  postoperative day #1.   DISPOSITION:  Home.   DISCHARGE INSTRUCTIONS:  The patient was instructed to refrain from any  heavy lifting or strenuous activity. However, he was encouraged  to be  ambulatory. He was instructed to gradually advance his diet once passing  flatus. He was instructed on the signs and symptoms of wound infection; and  told to call should he have any problems. He was also instructed on routine  Foley catheter care, and told to use a leg bag as needed for daytime usage.   DISCHARGE MEDICATIONS:  The patient was instructed to resume his regular  home medications except for any aspirin or nonsteroidal anti-inflammatory  drugs for a period of 10 days. He was given a prescription to take Vicodin,  as needed, for pain. He was also given a prescription to begin Cipro 1 day  prior to his return visit for Foley catheter removal. He was also instructed  to take Colace twice a day as a stool softener.   FOLLOWUP:  Mr. Nodal will followup with me in 1 week for Foley catheter  removal as well as staple removal. We will also go over his pathology, in  detail, at this time.           ______________________________  Heloise Purpura, MD  Electronically Signed  LB/MEDQ  D:  01/11/2006  T:  01/12/2006  Job:  [id number]   cc:   Rosalyn Gess. Norins, M.D. [facility]  [address]  [place]  [place] [address]

## 2011-03-31 NOTE — H&P (Signed)
Troy Jenkins, Troy Jenkins              ACCOUNT NO.:  [id number]   MEDICAL RECORD NO.:  [id number]          PATIENT TYPE:  INP   LOCATION:  X010                         FACILITY:  [facility]   PHYSICIAN:  Heloise Purpura, MD      DATE OF BIRTH:  01/21/1941   DATE OF ADMISSION:  01/10/2006  DATE OF DISCHARGE:                                HISTORY & PHYSICAL   CHIEF COMPLAINT:  Clinically localized adenocarcinoma of the prostate.   HISTORY:  Troy Jenkins is a 70 year old gentleman who was found to have  clinical stage T1C prostate cancer with a PSA of 4.93 and a Gleason score of  3 + 4 = 7.  Preoperative AUA symptom score was 5 with an IIEF score of 2.  After discussion regarding management options, the patient elected to  proceed with robotic-assisted laparoscopic radical prostatectomy (right  nerve sparing).   PAST MEDICAL HISTORY:  1.  Urolithiasis.  2.  Gout.  3.  Hypertension.   PAST SURGICAL HISTORY:  Anal fissure repair in the early 1980s.   PREMEDICATION:  1.  Prinivil.  2.  Indomethacin.  3.  Aspirin.  4.  Red yeast rice.   ALLERGIES:  The patient did have a reaction to IODINE in the 1960s while in  the [employer].  However, he has had exposure to iodine multiple times  including IV contrast without any side effects since that initial time.   FAMILY HISTORY:  Positive for coronary artery disease, hypertension,  diabetes, and urolithiasis.  There is no history of prostate cancer in the  family.   SOCIAL HISTORY:  The patient is currently retired.  He is married.  He did  smoke one pack of cigarettes for 10 years but quit in 1969.   PHYSICAL EXAMINATION:  CONSTITUTIONAL:  The patient is a well-nourished,  well-developed, age-appropriate male in no acute distress.  CARDIOVASCULAR:  Regular rate and rhythm without obvious murmurs.  LUNGS:  Clear bilaterally.  ABDOMEN:  Soft and nontender.  DIGITAL RECTAL EXAMINATION: No evidence of nodularity or induration.  EXTREMITIES: No  edema.   IMPRESSION:  Clinically localized adenocarcinoma of the prostate.   PLAN:  Troy Jenkins will undergo robotic-assisted laparoscopic radical  prostatectomy and then be admitted to the hospital for routine postoperative  care.           ______________________________  Heloise Purpura, MD  Electronically Signed     LB/MEDQ  D:  01/10/2006  T:  01/10/2006  Job:  [id number]

## 2011-03-31 NOTE — Op Note (Signed)
NAMEDEVARIOUS, PAVEK              ACCOUNT NO.:  [id number]   MEDICAL RECORD NO.:  [id number]          PATIENT TYPE:  INP   LOCATION:  1401                         [facility]ACILITY:  [facility]   PHYSICIAN:  Heloise Purpura, MD      DATE OF BIRTH:  May 14, 1941   DATE OF PROCEDURE:  01/10/2006  DATE OF DISCHARGE:                                 OPERATIVE REPORT   PREOPERATIVE DIAGNOSIS:  Clinically localized adenocarcinoma of the  prostate.   POSTOPERATIVE DIAGNOSIS:  Clinically localized adenocarcinoma of the  prostate.   PROCEDURES:  1.  Robotic-assisted laparoscopic radical prostatectomy (right nerve      sparing).  2.  Bilateral pelvic lymphadenectomy.   SURGEON:  Crecencio Mc, M.D.   ASSISTANT:  Excell Seltzer. Annabell Howells, M.D.   ANESTHESIA:  General.   COMPLICATIONS:  None.   ESTIMATED BLOOD LOSS:  150 mL.   INTRAVENOUS FLUIDS:  2000 mL of lactated Ringer's.   SPECIMENS:  1.  Prostate and seminal vesicles.  2.  Left pelvic lymph nodes.  3.  Right pelvic lymph nodes.   DRAINS:  1.  A #19 Blake pelvic drain.  2.  A 20-French Coude catheter.   INDICATION:  Mr. Lawhorn is a 70 year old gentleman with recently-diagnosed  clinical stage T1C prostate cancer with a PSA of 4.93 and a Gleason score  3+4=7.  Preoperative AUA symptom score was 5 with an IIEF score of 2.  After  discussion regarding management options for clinically localized prostate  cancer, the patient elected to proceed and with the above procedures.  Potential risks and benefits were discussed with the patient and he  consented.   DESCRIPTION OF PROCEDURE:  The patient was taken to the operating room and a  general anesthetic was administered.  He was given preoperative antibiotics,  placed in the dorsal lithotomy position, and prepped and draped in the usual  sterile fashion.  Next a preoperative time-out was performed.  A Foley  catheter was then inserted into the bladder.  A site was selected 18 cm from  the pubic symphysis  and just to the left of the umbilicus for placement of  the camera port.  This was placed using a standard Hasson technique.  This  allowed entry into the peritoneal cavity under direct vision.  A 12 mm port  was then placed and a pneumoperitoneum was established.  A 0 degree lens was  then used to inspect the entire abdomen and pelvis.  There was no evidence  of any intra-abdominal injuries or other abnormalities.  Attention then  turned to placement of the remaining ports.  Bilateral 8 mm robotic ports  placed 16 cm from the pubic symphysis and 10 cm lateral to the camera port.  An additional 8 mm robotic port was placed in the far left lateral abdominal  wall.  A 5 mm port was placed between the camera port and the right robotic  port.  An additional 12 mm port was placed in the far right lateral  abdominal wall for laparoscopic assistance.  All ports were placed under  direct vision and without difficulty.  The surgical cart was then docked.  With the aid of the hook cautery, the bladder was reflected posteriorly  allowing entry into the space of Retzius and identification of the  endopelvic fascia and prostate.  The endopelvic fascia was then incised from  the apex back to the base of the prostate bilaterally.  This isolated the  dorsal venous complex, which was then stapled and divided with a 45 mm Flex  ETS stapler.  The bladder neck was then identified with the aid of Foley  catheter manipulation.  The hook cautery was then used to enter the bladder  neck anteriorly.  This allowed exposure of the Foley catheter.  The Foley  catheter balloon was then deflated and the Foley catheter was brought into  the operative field and used to retract the prostate anteriorly.  This  exposed the posterior bladder neck, which was then incised.  Dissection  continued posteriorly until the vasa deferentia and seminal vesicles were  identified.  The vasa deferentia were each isolated and divided.   The  seminal vesicles were similarly isolated and then lifted anteriorly.  The  space between Denonvilliers' fascia and the anterior rectum was then bluntly  developed, thereby isolating the vascular pedicles of the prostate.  Attention then turned to the anterior aspect of the prostate.  The right  lateral prostatic fascia was sharply incised along the length of the  prostate, allowing the neurovascular bundle to be swept laterally and  posteriorly off the prostate.  The vascular pedicles of the prostate were  then ligated with Hemoloc clips and sharply divided.  At the base of the  right side of the prostate, care was taken to preserve the neurovascular  bundle.  On the left side, a wide non-nerve sparing procedure was performed.  Attention then turned distally to the urethra, which was sharply divided,  allowing the prostate to be disarticulated and placed up into the abdomen.  The pelvis was then copiously irrigated and hemostasis was ensured.  With  irrigation in the pelvis, air was injected into the rectal catheter and  there was no evidence of a rectal injury.  Attention then turned to the  right pelvic sidewall.  The fibrofatty tissue between the external iliac  vein, confluence of the iliac vessels, obturator nerve, and Cooper's  ligament was dissected free off the pelvic sidewall with Hemoloc clips used  for hemostasis and lymphostasis.  An identical procedure was then performed  on the contralateral side.  Both lymph node packets were removed for  permanent pathologic analysis.  Attention then turned to the urethral  anastomosis.  A 2-0 Vicryl slip knot was placed at the 6 o'clock position  between the bladder neck and urethra to reapproximate these structures.  A  double-armed 2-0 Monocryl suture was then used to perform a 360 degree  running tension-free anastomosis between the bladder neck and urethra.  A new 20-French Coude catheter was inserted into the bladder and  irrigated.  There was no evidence of blood clots within the bladder, and the anastomosis  appeared to be watertight.  A #19 Blake drain was then brought through the  left robotic port and appropriately positioned in the pelvis.  It was  secured to the skin with a nylon suture.  The surgical cart was then  undocked.  The prostate was placed into an Endopouch retrieval bag via the  periumbilical camera port incision.  A 0 Vicryl suture was then placed  through the abdominal wall of the  right lateral 12 mm port site for port  site closure.  All remaining ports were then removed under direct vision.  The camera port site was then slightly extended inferiorly, allowing the  prostate specimen to be removed intact within the Endopouch retrieval bag.  This fascial opening was then closed with a running 0 Vicryl suture.  All  port sites were then injected with 0.25% Marcaine and reapproximated at the  skin level with staples.  Sterile dressings were applied.  The patient appeared to tolerate the procedure well and without  complications.  All sponge and needle counts were correct x2 at the end of  the procedure.  The patient was able to be extubated and transferred to the  recovery unit in satisfactory condition.           ______________________________  Heloise Purpura, MD  Electronically Signed     LB/MEDQ  D:  01/10/2006  T:  01/11/2006  Job:  [id number]

## 2011-03-31 NOTE — Assessment & Plan Note (Signed)
[facility]                             PRIMARY CARE OFFICE NOTE   NAME:[name], Jenkins                     MRN:          [id number]  DATE:06/04/2006                            DOB:          1941/03/22    [name] is a 70 year old gentleman who presents for follow-up evaluation  and physical exam.  He was last seen in the office by Corwin Levins, MD,  December 25, 2005, in preparation and preop evaluation for robotic radical  prostatectomy.  As part of his evaluation the patient did have a stress  nuclear study performed December 27, 2005, read out as a normal study with  no evidence of any coronary obstruction.  The patient did undergo robotic-  assisted radical prostatectomy January 10, 2006, and had an excellent  result with good recovery.  He does report that he has not had an erection  since the surgery, although he has not made an effort.  He was suggested to  take Cialis, which he has not done.  The patient does follow with his  urologist on a regular basis.  The patient's interval history is otherwise  unremarkable.   The patient is complaining of pain in the right posterior neck for greater  than 10 weeks.  He does get some relief from ibuprofen.  He does get some  radiation to the trapezius muscles on the right, but he has had no  paresthesias, no motor weakness.  E-Chart was queried, and the patient did  have a whole body bone scan prior to his prostatectomy, which showed no  metastatic disease to bone.  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  He has facet degenerative changes at C6-7.   PAST MEDICAL HISTORY:  Surgery:  Radical prostatectomy as noted.   Medical:  1.  The usual childhood diseases.  2.  Hypertension.  3.  Gout.   CURRENT MEDICATIONS:  1.  Prinivil 10 mg daily.  2.  Aspirin 81 mg daily.  3.   Multivitamin daily.  4.  Red yeast rice daily.  5.  Indomethacin 25 mg q.8h. p.r.n.   FAMILY HISTORY AND SOCIAL HISTORY:  Documented in earlier notes.   REVIEW OF SYSTEMS:  Negative for constitutional problems.  He has had an eye  exam in the last 12 months.  No ENT complaints.  No cardiovascular,  respiratory or GI problems.  GU:  Per the HPI.  No musculoskeletal or  dermatologic complaints.   PHYSICAL EXAMINATION:  VITAL SIGNS:  Temperature was 97.5, blood pressure  137/84, pulse 73, weight 197.  GENERAL APPEARANCE:  This is a well-nourished, well-developed gentleman who  looks fit, who is in no acute distress.  HEENT:  Normocephalic, atraumatic and unremarkable.  Conjunctivae and  sclerae were clear.  Pupils equal, round and reactive to light and  accommodation.  Funduscopic examination is deferred to ophthalmology.  NECK:  Supple without thyromegaly.  No adenopathy was noted in the cervical  or  supraclavicular regions.  CHEST:  No CVA tenderness.  Lungs were clear to auscultation and percussion.  CARDIOVASCULAR:  2+ radial pulse, no JVD or carotid bruits.  He had a quiet  precordium with regular rate and rhythm without murmurs, rubs or gallops.  ABDOMEN:  Soft, no guarding, no rebound.  No organomsplenomegaly was noted.  GENITALIA AND RECTAL EXAMS:  Deferred to urology.  EXTREMITIES:  Without clubbing, cyanosis, edema or deformity.  The patient  had normal full range of motion of his neck with no crepitus.  He had  minimal tenderness to palpation.   Laboratory from Apr 12, 2006, revealed a hemoglobin of 15 g, white count was  3300.  Cholesterol 213 with a triglycerides of 167, HDL was 44.3, LDL was  139.  Chemistries revealed a serum glucose of 113.  Electrolytes were  normal.  Liver functions and kidney function were normal.  TSH was normal at  2.03.  PSA was 0.01.   ASSESSMENT AND PLAN:  1.  Genitourinary.  The patient is status post radical prostatectomy in      February  by Heloise Purpura, MD.  He currently is doing well.  He will      follow up with Dr. Laverle Patter as instructed.  I suggested he may want to try      Cialis in regard to sexual function.  2.  Hypertension, excellent control.  The patient will continue his present      medications.  3.  Lipids.  The patient is close to goal, which would be an LDL of less      than 130.  We will have him continue red yeast rice, which he prefers to      prescription statin drug, and to increase dietary vigilance.  He should      resume a full physical activity program.  4.  Gout.  Quiescent with no recent flares.  5.  Neck pain.  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  Plan:  Range of motion exercise.  Acetaminophen 1000      mg t.i.d. as initial treatment.  If this fails to relieve his      discomfort, would switch to naproxen sodium 200 mg a.m. and p.m.      (Aleve).  6.  Health maintenance.  The patient last had colonoscopy Mar 23, 1999, with      left-sided diverticulosis, internal hemorrhoids.  No polyps or      abnormalities were noted.  The patient would be a candidate for follow-      up in 2010.  The patient's last tetanus shot was in 2006.   SUMMARY:  A pleasant gentleman.  He has done well with his prostatectomy and  seems stable at this time.  Glad to see the patient back on an as-needed  basis.  Otherwise, he is to return in 1 year for routine follow-up.                                   Rosalyn Gess Norins, MD   MEN/MedQ  DD:  06/04/2006  DT:  06/05/2006  Job #:  [id number]   cc:   Linward Foster, MD

## 2011-10-14 ENCOUNTER — Other Ambulatory Visit: Payer: Self-pay | Admitting: Internal Medicine

## 2011-11-13 ENCOUNTER — Encounter: Payer: Self-pay | Admitting: Internal Medicine

## 2011-11-23 ENCOUNTER — Other Ambulatory Visit: Payer: Self-pay | Admitting: *Deleted

## 2011-11-23 ENCOUNTER — Ambulatory Visit (INDEPENDENT_AMBULATORY_CARE_PROVIDER_SITE_OTHER): Payer: Medicare Other | Admitting: Internal Medicine

## 2011-11-23 ENCOUNTER — Encounter: Payer: Self-pay | Admitting: Internal Medicine

## 2011-11-23 VITALS — BP 142/90 | HR 66 | Temp 98.2°F | Wt 198.0 lb

## 2011-11-23 DIAGNOSIS — E785 Hyperlipidemia, unspecified: Secondary | ICD-10-CM

## 2011-11-23 DIAGNOSIS — I10 Essential (primary) hypertension: Secondary | ICD-10-CM

## 2011-11-23 DIAGNOSIS — Z0000 Encounter for general adult medical examination without abnormal findings: Secondary | ICD-10-CM

## 2011-11-23 DIAGNOSIS — M109 Gout, unspecified: Secondary | ICD-10-CM

## 2011-11-23 MED ORDER — INDOMETHACIN 25 MG PO CAPS: 25.0000 mg | ORAL_CAPSULE | Freq: Three times a day (TID) | ORAL | Status: DC | PRN

## 2011-11-23 MED ORDER — LISINOPRIL 10 MG PO TABS: 10.0000 mg | ORAL_TABLET | Freq: Every day | ORAL | Status: DC

## 2011-11-23 MED ORDER — GLUCOSAMINE-CHONDROITIN 500-400 MG PO TABS: 1.0000 | ORAL_TABLET | Freq: Two times a day (BID) | ORAL | Status: DC

## 2011-11-23 MED ORDER — LOVASTATIN 20 MG PO TABS: 20.0000 mg | ORAL_TABLET | Freq: Every day | ORAL | Status: DC

## 2011-11-23 NOTE — Progress Notes (Signed)
Subjective:    Patient ID: Troy Jenkins, male    DOB: October 08, 1941, 71 y.o.   MRN: [id number]  HPI The patient is here for annual Medicare wellness examination and management of other chronic and acute problems.   Interval history is negative: no major illness, no surgery and no injury.   The risk factors are reflected in the social history.  The roster of all physicians providing medical care to patient - is listed in the Snapshot section of the chart.  Activities of daily living:  The patient is 100% inedpendent in all ADLs: dressing, toileting, feeding as well as independent mobility  Home safety : The patient has smoke detectors in the home. Fall - home is fall-safe; grab bars recommended. They wear seatbelts. firearms are present in the home, kept in a safe fashion. There is no violence in the home.   There is no risks for hepatitis, STDs or HIV. There is no  history of blood transfusion. They have no travel history to infectious disease endemic areas of the world.  The patient has seen their dentist in the last six month. They have seen their eye doctor in the last year. They deny any hearing difficulty and have not had audiologic testing in the last year.  They do not  have excessive sun exposure. Discussed the need for sun protection: hats, long sleeves and use of sunscreen if there is significant sun exposure.   Diet: the importance of a healthy diet is discussed. They do have a healthy diet.  The patient has no regular exercise program.  The benefits of regular aerobic exercise were discussed.  Depression screen: there are no signs or vegative symptoms of depression- irritability, change in appetite, anhedonia, sadness/tearfullness.  Cognitive assessment: the patient manages all their financial and personal affairs and is actively engaged.   The following portions of the patient's history were reviewed and updated as appropriate: allergies, current medications, past family  history, past medical history,  past surgical history, past social history  and problem list.  Vision, hearing, body mass index were assessed and reviewed.   During the course of the visit the patient was educated and counseled about appropriate screening and preventive services including : fall prevention , diabetes screening, nutrition counseling, colorectal cancer screening, and recommended immunizations.  Past Medical History  Diagnosis Date  . GOUT 08/13/2007  . HYPERLIPIDEMIA 08/13/2007  . HYPERTENSION 08/13/2007  . 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 Cancer     prostate   Past Surgical History  Procedure Date  . Treatment fistula anal 1980  . Prostate surgery 12/2005  . Hernia repair 2010    Left    Family History  Problem Relation Age of Onset  . Leukemia Mother   . Diabetes Father   . Stroke Father    History   Social History  . Marital Status: Married    Spouse Name: N/A    Number of Children: N/A  . Years of Education: N/A   Occupational History  . Not on file.   Social History Main Topics  . Smoking status: Never Smoker   . Smokeless tobacco: Never Used  . Alcohol Use: Yes  . Drug Use: No  . Sexually Active: Not Currently   Other Topics Concern  . Not on file   Social History Narrative   HSG, [employer]- textile degree. Married 1965. No children. 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       Review of Systems Constitutional:  Negative for fever, chills, activity change and unexpected weight change.  HEENT:  Negative for hearing loss, ear pain, congestion, neck stiffness and postnasal drip. Negative for sore throat or swallowing problems. Negative for dental complaints.   Eyes: Negative for vision loss or change in visual acuity.  Respiratory: Negative for chest tightness and wheezing. Negative for DOE.   Cardiovascular: Negative for chest pain or  palpitations. No decreased exercise tolerance Gastrointestinal: No change in bowel habit. No bloating or gas. No reflux or indigestion Genitourinary: Negative for urgency, frequency, flank pain and difficulty urinating.  Musculoskeletal: Negative for myalgias, back pain, arthralgias and gait problem.  Neurological: Negative for dizziness, tremors, weakness and headaches.  Hematological: Negative for adenopathy.  Psychiatric/Behavioral: Negative for behavioral problems and dysphoric mood.       Objective:   Physical Exam Vital signs reviewed  Filed Vitals:   11/23/11 1344  BP: 142/90  Pulse: 66  Temp: 98.2 F (36.8 C)    Gen'l: Well nourished well developed white male in no acute distress  HEENT: Head: Normocephalic and atraumatic. Right Ear: External ear normal. EAC/TM nl. Left Ear: External ear normal.  EAC/TM nl. Nose: Nose normal. Mouth/Throat: Oropharynx is clear and moist. Dentition - native, in good repair. No buccal or palatal lesions. Posterior pharynx clear. Eyes: Conjunctivae and sclera clear. EOM intact. Pupils are equal, round, and reactive to light. Right eye exhibits no discharge. Left eye exhibits no discharge. Neck: Normal range of motion. Neck supple. No JVD present. No tracheal deviation present. No thyromegaly present.  Cardiovascular: Normal rate, regular rhythm, no gallop, no friction rub, no murmur heard.      Quiet precordium. 2+ radial and DP pulses . No carotid bruits Pulmonary/Chest: Effort normal. No respiratory distress or increased WOB, no wheezes, no rales. No chest wall deformity or CVAT. Abdominal: Soft. Bowel sounds are normal in all quadrants. He exhibits no distension, no tenderness, no rebound or guarding, No heptosplenomegaly  Genitourinary:   Musculoskeletal: Normal range of motion. He exhibits no edema and no tenderness.       Small and large joints without redness, synovial thickening or deformity. Full range of motion preserved about all small,  median and large joints.  Lymphadenopathy:    He has no cervical or supraclavicular adenopathy.  Neurological: He is alert and oriented to person, place, and time. CN II-XII intact. DTRs 2+ and symmetrical biceps, radial and patellar tendons. Cerebellar function normal with no tremor, rigidity, normal gait and station.  Skin: Skin is warm and dry. No rash noted. No erythema.  Psychiatric: He has a normal mood and affect. His behavior is normal. Thought content normal.   Lab Results  Component Value Date   WBC 4.2 01/06/2009   HGB 15.0 01/06/2009   HCT 43.6 01/06/2009   PLT 170 01/06/2009   GLUCOSE 91 09/30/2009   CHOL 196 09/30/2009   TRIG 140.0 09/30/2009   HDL 51.70 09/30/2009   LDLDIRECT 99.3 07/23/2008   LDLCALC 116* 09/30/2009   ALT 41 09/30/2009   AST 40* 09/30/2009   NA 142 09/30/2009   K 4.0 09/30/2009   CL 101 09/30/2009   CREATININE 1.0 09/30/2009   BUN 12 09/30/2009   CO2 32 09/30/2009   TSH 1.25 09/30/2009   PSA 0.00* 08/01/2007          Assessment & Plan:

## 2011-11-23 NOTE — Telephone Encounter (Signed)
Refill request. Lisinopril 10mg . Lovastatin 20mg . Indomethacin 25mg .

## 2011-11-26 ENCOUNTER — Encounter: Payer: Self-pay | Admitting: Internal Medicine

## 2011-11-26 DIAGNOSIS — Z0000 Encounter for general adult medical examination without abnormal findings: Secondary | ICD-10-CM | POA: Insufficient documentation

## 2011-11-26 NOTE — Assessment & Plan Note (Addendum)
No recent flares of gout.  Plan - uric acid level

## 2011-11-26 NOTE — Assessment & Plan Note (Signed)
BP Readings from Last 3 Encounters:  11/23/11 142/90  10/03/10 156/92  08/30/10 138/82   Borderline control.  Plan - patient to do home monitoring and report values back. Recommendations to follow.

## 2011-11-26 NOTE — Assessment & Plan Note (Signed)
Interval medical history is negative for any major illness, surgery or injury. Physical exam is unremarkable. He will return for routine lab: lipid panel, LFTs, basic metabolic panel and Uric acid level. Immunizations: tetanus is up to date. Due for pneumonia and shingles vaccines. He is current for colorectal cancer screening. Last EKG Nov '11 - normal.  In summary- a nice man who appears to be medically stable. He will return for lab with recommendations to follow. He will otherwise return as needed or in 1 year.

## 2011-11-26 NOTE — Assessment & Plan Note (Signed)
Last lipid panel in '10 was in normal range. He continues to take lovastatin.  Plan - routine lipid panel and liver functions with recommendations to follow.

## 2011-12-01 ENCOUNTER — Telehealth: Payer: Self-pay | Admitting: *Deleted

## 2011-12-01 NOTE — Telephone Encounter (Signed)
Notified pt that his lab orders have been sent in & he will be coming in to have them completed.

## 2011-12-05 ENCOUNTER — Other Ambulatory Visit (INDEPENDENT_AMBULATORY_CARE_PROVIDER_SITE_OTHER): Payer: Medicare Other

## 2011-12-05 DIAGNOSIS — E785 Hyperlipidemia, unspecified: Secondary | ICD-10-CM

## 2011-12-05 DIAGNOSIS — I10 Essential (primary) hypertension: Secondary | ICD-10-CM

## 2011-12-05 DIAGNOSIS — M109 Gout, unspecified: Secondary | ICD-10-CM

## 2011-12-05 LAB — HEPATIC FUNCTION PANEL
ALT: 24 U/L (ref 0–53)
AST: 25 U/L (ref 0–37)
Albumin: 4.2 g/dL (ref 3.5–5.2)
Alkaline Phosphatase: 60 U/L (ref 39–117)
Bilirubin, Direct: 0.1 mg/dL (ref 0.0–0.3)
Total Bilirubin: 0.7 mg/dL (ref 0.3–1.2)
Total Protein: 7.3 g/dL (ref 6.0–8.3)

## 2011-12-05 LAB — COMPREHENSIVE METABOLIC PANEL
ALT: 24 U/L (ref 0–53)
Albumin: 4.2 g/dL (ref 3.5–5.2)
CO2: 28 mEq/L (ref 19–32)
Chloride: 104 mEq/L (ref 96–112)
GFR: 92.1 mL/min (ref >60.00)
Glucose, Bld: 100 mg/dL — ABNORMAL HIGH (ref 70–99)
Potassium: 4.3 mEq/L (ref 3.5–5.1)
Sodium: 140 mEq/L (ref 135–145)
Total Protein: 7.3 g/dL (ref 6.0–8.3)

## 2011-12-05 LAB — LIPID PANEL: Cholesterol: 183 mg/dL (ref 0–200)

## 2011-12-05 LAB — URIC ACID: Uric Acid, Serum: 6.6 mg/dL (ref 4.0–7.8)

## 2012-06-22 DIAGNOSIS — T148XXA Other injury of unspecified body region, initial encounter: Secondary | ICD-10-CM

## 2012-06-22 HISTORY — DX: Other injury of unspecified body region, initial encounter: T14.8XXA

## 2012-08-13 DIAGNOSIS — C44601 Unspecified malignant neoplasm of skin of unspecified upper limb, including shoulder: Secondary | ICD-10-CM

## 2012-08-13 HISTORY — DX: Unspecified malignant neoplasm of skin of unspecified upper limb, including shoulder: C44.601

## 2012-11-25 ENCOUNTER — Ambulatory Visit (INDEPENDENT_AMBULATORY_CARE_PROVIDER_SITE_OTHER): Payer: Medicare Other | Admitting: Internal Medicine

## 2012-11-25 ENCOUNTER — Encounter: Payer: Self-pay | Admitting: Internal Medicine

## 2012-11-25 VITALS — BP 138/88 | HR 69 | Temp 98.0°F | Resp 10 | Ht 70.0 in | Wt 199.1 lb

## 2012-11-25 DIAGNOSIS — Z0000 Encounter for general adult medical examination without abnormal findings: Secondary | ICD-10-CM

## 2012-11-25 DIAGNOSIS — I10 Essential (primary) hypertension: Secondary | ICD-10-CM

## 2012-11-25 DIAGNOSIS — M109 Gout, unspecified: Secondary | ICD-10-CM

## 2012-11-25 DIAGNOSIS — E785 Hyperlipidemia, unspecified: Secondary | ICD-10-CM

## 2012-11-25 NOTE — Patient Instructions (Addendum)
Thanks for coming in. Your exam is good and the lab from the [place] are good.  Advanced Care Planning: resources - www. [employer]versation[employer].org", Begin the [employer].org, google up POLST for a [employer] about these issues.  Fitness is your JOB, not a leisure time activity. Aerobic exericse, 30 min with elevated heart rate, at least 3 times a week; flex stretch exercise at least twice a week.   Come back when you need me or in 1 year.

## 2012-11-25 NOTE — Progress Notes (Signed)
Subjective:    Patient ID: Troy Jenkins, male    DOB: Apr 30, 1941, 72 y.o.   MRN: [id number]  HPI The patient is here for annual Medicare wellness examination and management of other chronic and acute problems.  Interval history is unremarkable with no major illness. He had small skin cancer excised left forearm. Tore a flexor ligament left forearm which healed w/o surgical intervention.   The risk factors are reflected in the social history.  The roster of all physicians providing medical care to patient - is listed in the Snapshot section of the chart.  Activities of daily living:  The patient is 100% inedpendent in all ADLs: dressing, toileting, feeding as well as independent mobility  Home safety : The patient has smoke detectors in the home. Falls - no falls. Home is fall safe. has grab bars in the shower. They wear seatbelts.  firearms are present in the home, kept in a safe fashion. There is no violence in the home.   There is no risks for hepatitis, STDs or HIV. There is no   history of blood transfusion. They have no travel history to infectious disease endemic areas of the world.  The patient has  seen their dentist in the last six month. They have seen their eye doctor in the last year. They deny any hearing difficulty and have not had audiologic testing in the last year.    They do not  have excessive sun exposure. Discussed the need for sun protection: hats, long sleeves and use of sunscreen if there is significant sun exposure.   Diet: the importance of a healthy diet is discussed. They do have a healthy diet.  The patient has a regular exercise program: golf x 3 , 90 min duration, 3 per week.  The benefits of regular aerobic exercise were discussed.  Depression screen: there are no signs or vegative symptoms of depression- irritability, change in appetite, anhedonia, sadness/tearfullness.  Cognitive assessment: the patient manages all their financial and personal  affairs and is actively engaged.   The following portions of the patient's history were reviewed and updated as appropriate: allergies, current medications, past family history, past medical history,  past surgical history, past social history  and problem list.  Past Medical History  Diagnosis Date  . GOUT 08/13/2007  . HYPERLIPIDEMIA 08/13/2007  . HYPERTENSION 08/13/2007  . 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 Cancer     prostate  . 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 Ligament tear 06/22/12    left wrist    Past Surgical History  Procedure Date  . Treatment fistula anal 1980  . Prostate surgery 12/2005  . Hernia repair 2010    Left    Family History  Problem Relation Age of Onset  . Leukemia Mother   . Diabetes Father   . Stroke Father    History   Social History  . Marital Status: Married    Spouse Name: N/A    Number of Children: N/A  . Years of Education: N/A   Occupational History  . Not on file.   Social History Main Topics  . Smoking status: Former Smoker    Quit date: 05/31/1968  . Smokeless tobacco: Never Used  . Alcohol Use: Yes  . Drug Use: No  . Sexually Active: Not Currently   Other Topics Concern  . Not on file   Social History Narrative   HSG,  [employer]- textile degree. Married 1965. No children. 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    Current Outpatient Prescriptions on File Prior to Visit  Medication Sig Dispense Refill  . aspirin 81 MG tablet Take 81 mg by mouth daily.        Marland Kitchen glucosamine-chondroitin 500-400 MG tablet Take 1 tablet by mouth 2 (two) times daily with a meal.  180 tablet  3  . Ibuprofen 200 MG CAPS Take by mouth as needed.        . indomethacin (INDOCIN) 25 MG capsule Take 1 capsule (25 mg total) by mouth every 8 (eight) hours as needed.  90 capsule  3  . lisinopril (PRINIVIL,ZESTRIL) 10 MG  tablet Take 1 tablet (10 mg total) by mouth daily.  90 tablet  3  . lovastatin (MEVACOR) 20 MG tablet Take 1 tablet (20 mg total) by mouth at bedtime.  90 tablet  3  . Multiple Vitamin (MULTIVITAMIN) tablet Take 1 tablet by mouth daily.           Vision, hearing, body mass index were assessed and reviewed.   During the course of the visit the patient was educated and counseled about appropriate screening and preventive services including : fall prevention , diabetes screening, nutrition counseling, colorectal cancer screening, and recommended immunizations.    Review of Systems Constitutional:  Negative for fever, chills, activity change and unexpected weight change.  HEENT:  Negative for hearing loss, ear pain, congestion, neck stiffness and postnasal drip. Negative for sore throat or swallowing problems. Negative for dental complaints.   Eyes: Negative for vision loss or change in visual acuity.  Respiratory: Negative for chest tightness and wheezing. Negative for DOE.   Cardiovascular: Negative for chest pain or palpitations. No decreased exercise tolerance Gastrointestinal: No change in bowel habit. No bloating or gas. No reflux or indigestion Genitourinary: Negative for urgency, frequency, flank pain and difficulty urinating.  Musculoskeletal: Negative for myalgias, back pain, arthralgias and gait problem.  Neurological: Negative for dizziness, tremors, weakness and headaches.  Hematological: Negative for adenopathy.  Psychiatric/Behavioral: Negative for behavioral problems and dysphoric mood.      Objective:   Physical Exam Filed Vitals:   11/25/12 0844  BP: 138/88  Pulse: 69  Temp: 98 F (36.7 C)  Resp: 10   Wt Readings from Last 3 Encounters:  11/25/12 199 lb 1.3 oz (90.302 kg)  11/23/11 198 lb (89.812 kg)  10/03/10 200 lb (90.719 kg)   Gen'l: Well nourished well developed white male in no acute distress  HEENT: Head: Normocephalic and atraumatic. Right Ear: External  ear normal. EAC/TM nl. Left Ear: External ear normal.  EAC/TM nl. Nose: Nose normal. Mouth/Throat: Oropharynx is clear and moist. Dentition - native, in good repair. No buccal or palatal lesions. Posterior pharynx clear. Eyes: Conjunctivae and sclera clear. EOM intact. Pupils are equal, round, and reactive to light. Right eye exhibits no discharge. Left eye exhibits no discharge. Neck: Normal range of motion. Neck supple. No JVD present. No tracheal deviation present. No thyromegaly present.  Cardiovascular: Normal rate, regular rhythm, no gallop, no friction rub, no murmur heard.      Quiet precordium. 2+ radial and DP pulses . No carotid bruits Pulmonary/Chest: Effort normal. No respiratory distress or increased WOB, no wheezes, no rales. No chest wall deformity or CVAT. Abdomen: Soft. Bowel sounds are normal in all quadrants. He exhibits no distension, no tenderness, no  rebound or guarding, No heptosplenomegaly  Genitourinary:  deferred to Urology Musculoskeletal: Normal range of motion. He exhibits no edema and no tenderness.       Small and large joints without redness, synovial thickening or deformity. Full range of motion preserved about all small, median and large joints.  Lymphadenopathy:    He has no cervical or supraclavicular adenopathy.  Neurological: He is alert and oriented to person, place, and time. CN II-XII intact. DTRs 2+ and symmetrical biceps, radial and patellar tendons. Cerebellar function normal with no tremor, rigidity, normal gait and station.  Skin: Skin is warm and dry. No rash noted. No erythema.  Psychiatric: He has a normal mood and affect. His behavior is normal. Thought content normal.   Reviewed lab from [place] (scanned): PSA undetectable, lipid panel - normal except for triglycerides =244, chemistries normal         Assessment & Plan:

## 2012-11-26 NOTE — Assessment & Plan Note (Signed)
[facility] into [facility] - normal range and at goal except for mild elevation triglycerides

## 2012-11-26 NOTE — Assessment & Plan Note (Signed)
Interval history notable for excision of small skin cancer left forearm and ligament tear left forearm that kept him from playing golf. Limited physical exam is normal. Lab results reviewed and OK. He is current with colorectal cancer screening. He is current with urology follow up - prostate disease. Immunizations are current except for pneumonia vaccine - supposedly done but record not located.   In summary - a nice man who appears to be medically stable. He will return in 1 year or sooner as needed.

## 2012-11-26 NOTE — Assessment & Plan Note (Signed)
BP Readings from Last 3 Encounters:  11/25/12 138/88  11/23/11 142/90  10/03/10 156/92   Adequate control at today's visit on present medication

## 2012-11-26 NOTE — Assessment & Plan Note (Signed)
No flares of gout in the interval since last visit

## 2012-11-28 ENCOUNTER — Other Ambulatory Visit: Payer: Self-pay | Admitting: Internal Medicine

## 2013-06-18 ENCOUNTER — Other Ambulatory Visit: Payer: Self-pay

## 2013-11-13 ENCOUNTER — Other Ambulatory Visit: Payer: Self-pay | Admitting: Internal Medicine

## 2013-11-26 ENCOUNTER — Ambulatory Visit (INDEPENDENT_AMBULATORY_CARE_PROVIDER_SITE_OTHER): Payer: Medicare Other | Admitting: Internal Medicine

## 2013-11-26 ENCOUNTER — Encounter: Payer: Self-pay | Admitting: Internal Medicine

## 2013-11-26 ENCOUNTER — Other Ambulatory Visit (INDEPENDENT_AMBULATORY_CARE_PROVIDER_SITE_OTHER): Payer: Medicare Other

## 2013-11-26 VITALS — BP 160/100 | HR 66 | Temp 98.8°F | Ht 70.0 in | Wt 199.0 lb

## 2013-11-26 DIAGNOSIS — Z8546 Personal history of malignant neoplasm of prostate: Secondary | ICD-10-CM

## 2013-11-26 DIAGNOSIS — I10 Essential (primary) hypertension: Secondary | ICD-10-CM

## 2013-11-26 DIAGNOSIS — E785 Hyperlipidemia, unspecified: Secondary | ICD-10-CM

## 2013-11-26 DIAGNOSIS — Z23 Encounter for immunization: Secondary | ICD-10-CM

## 2013-11-26 DIAGNOSIS — M109 Gout, unspecified: Secondary | ICD-10-CM

## 2013-11-26 DIAGNOSIS — Z0000 Encounter for general adult medical examination without abnormal findings: Secondary | ICD-10-CM

## 2013-11-26 DIAGNOSIS — I861 Scrotal varices: Secondary | ICD-10-CM

## 2013-11-26 LAB — HEPATIC FUNCTION PANEL
ALT: 27 U/L (ref 0–53)
AST: 29 U/L (ref 0–37)
Albumin: 4.6 g/dL (ref 3.5–5.2)
Alkaline Phosphatase: 68 U/L (ref 39–117)
BILIRUBIN DIRECT: 0.2 mg/dL (ref 0.0–0.3)
BILIRUBIN TOTAL: 0.8 mg/dL (ref 0.3–1.2)
Total Protein: 7.8 g/dL (ref 6.0–8.3)

## 2013-11-26 LAB — URIC ACID: Uric Acid, Serum: 6.7 mg/dL (ref 4.0–7.8)

## 2013-11-26 LAB — BASIC METABOLIC PANEL
BUN: 17 mg/dL (ref 6–23)
CO2: 29 meq/L (ref 19–32)
CREATININE: 0.9 mg/dL (ref 0.4–1.5)
Calcium: 9.9 mg/dL (ref 8.4–10.5)
Chloride: 101 mEq/L (ref 96–112)
GFR: 85.86 mL/min (ref >60.00)
Glucose, Bld: 96 mg/dL (ref 70–99)
Potassium: 4.2 mEq/L (ref 3.5–5.1)
SODIUM: 138 meq/L (ref 135–145)

## 2013-11-26 LAB — TSH: TSH: 1.45 u[IU]/mL (ref 0.35–5.50)

## 2013-11-26 LAB — LIPID PANEL
CHOLESTEROL: 194 mg/dL (ref 0–200)
HDL: 55.3 mg/dL (ref >39.00)
LDL Cholesterol: 109 mg/dL — ABNORMAL HIGH (ref 0–99)
TRIGLYCERIDES: 150 mg/dL — AB (ref 0.0–149.0)
Total CHOL/HDL Ratio: 4
VLDL: 30 mg/dL (ref 0.0–40.0)

## 2013-11-26 LAB — PSA: PSA: 0 ng/mL — AB (ref 0.10–4.00)

## 2013-11-26 NOTE — Progress Notes (Signed)
Subjective:    Patient ID: Troy Jenkins, male    DOB: [birth year], 73 y.o.   MRN: [id number]  HPI The patient is here for annual Medicare wellness examination and management of other chronic and acute problems.  Interval history: during the summer he had back trouble - muscle spasms across mid-back, saw Chiropractor with some relief but got better with the use of NSAIDs; Gi - had some mild constipation. Now taking a [employer] stool softener.    The risk factors are reflected in the social history.  The roster of all physicians providing medical care to patient - is listed in the Snapshot section of the chart.  Activities of daily living:  The patient is 100% inedpendent in all ADLs: dressing, toileting, feeding as well as independent mobility  Home safety : The patient has smoke detectors in the home. They wear seatbelts.  firearms are present in the home, kept in a safe fashion. There is no violence in the home.   There is no risks for hepatitis, STDs or HIV. There is no history of blood transfusion. They have no travel history to infectious disease endemic areas of the world.  The patient has seen their dentist in the last six month. They have seen their eye doctor in the last year. They deny any hearing difficulty and have not had audiologic testing in the last year.    They do not  have excessive sun exposure. Discussed the need for sun protection: hats, long sleeves and use of sunscreen if there is significant sun exposure.   Diet: the importance of a healthy diet is discussed. They do have a healthy diet.  The patient has a regular exercise program: golf ,  60 - 90 min duration, 3 per week.  The benefits of regular aerobic exercise were discussed.  Depression screen: there are no signs or vegative symptoms of depression- irritability, change in appetite, anhedonia, sadness/tearfullness.  Cognitive assessment: the patient manages all their financial and personal affairs and is  actively engaged.   The following portions of the patient's history were reviewed and updated as appropriate: allergies, current medications, past family history, past medical history,  past surgical history, past social history  and problem list.  Vision, hearing, body mass index were assessed and reviewed.   During the course of the visit the patient was educated and counseled about appropriate screening and preventive services including : fall prevention , diabetes screening, nutrition counseling, colorectal cancer screening, and recommended immunizations.  Past Medical History  Diagnosis Date  . GOUT 08/13/2007  . HYPERLIPIDEMIA 08/13/2007  . HYPERTENSION 08/13/2007  . 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 Cancer     prostate  . 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 Ligament tear 06/22/12    left wrist    Past Surgical History  Procedure Laterality Date  . Treatment fistula anal  1980  . Prostate surgery  12/2005  . Hernia repair  2010    Left    Family History  Problem Relation Age of Onset  . Leukemia Mother   . Diabetes Father   . Stroke Father    History   Social History  . Marital Status: Married    Spouse Name: N/A    Number of Children: N/A  . Years of Education: N/A   Occupational History  . Not on file.   Social History Main Topics  . Smoking status: Former Smoker  Quit date: 05/31/1968  . Smokeless tobacco: Never Used  . Alcohol Use: Yes  . Drug Use: No  . Sexual Activity: Not Currently   Other Topics Concern  . Not on file   Social History Narrative   [employer], [employer]- textile degree. Married 1965. No children. Work - retired from [employer]. Manages Theatre manager. Advanced Care Planning - raised the issue for future discussion.    Current Outpatient Prescriptions on File Prior to Visit  Medication Sig Dispense Refill  . aspirin 81 MG tablet Take 81 mg by mouth daily.        Marland Kitchen  glucosamine-chondroitin 500-400 MG tablet Take 1 tablet by mouth 2 (two) times daily with a meal.  180 tablet  3  . Ibuprofen 200 MG CAPS Take by mouth as needed.        . indomethacin (INDOCIN) 25 MG capsule Take 1 capsule (25 mg total) by mouth every 8 (eight) hours as needed.  90 capsule  3  . lisinopril (PRINIVIL,ZESTRIL) 10 MG tablet TAKE ONE TABLET BY MOUTH EVERY DAY  90 tablet  3  . lovastatin (MEVACOR) 20 MG tablet TAKE ONE TABLET BY MOUTH AT BEDTIME  90 tablet  3  . Multiple Vitamin (MULTIVITAMIN) tablet Take 1 tablet by mouth daily.         No current facility-administered medications on file prior to visit.      Review of Systems Constitutional:  Negative for fever, chills, activity change and unexpected weight change.  HEENT:  Negative for hearing loss, ear pain, congestion, neck stiffness and postnasal drip. Negative for sore throat or swallowing problems. Negative for dental complaints.   Eyes: Negative for vision loss or change in visual acuity.  Respiratory: Negative for chest tightness and wheezing. Negative for DOE.   Cardiovascular: Negative for chest pain or palpitations. No decreased exercise tolerance Gastrointestinal: No change in bowel habit. No bloating or gas. No reflux or indigestion Genitourinary: Negative for urgency, frequency, flank pain and difficulty urinating.  Musculoskeletal: Negative for myalgias, back pain, arthralgias and gait problem.  Neurological: Negative for dizziness, tremors, weakness and headaches.  Hematological: Negative for adenopathy.  Psychiatric/Behavioral: Negative for behavioral problems and dysphoric mood.       Objective:   Physical Exam Filed Vitals:   11/26/13 0948  BP: 160/100  Pulse: 66  Temp: 98.8 F (37.1 C)   Wt Readings from Last 3 Encounters:  11/26/13 199 lb (90.266 kg)  11/25/12 199 lb 1.3 oz (90.302 kg)  11/23/11 198 lb (89.812 kg)   Gen'l: Well nourished well developed male in no acute distress  HEENT:  Head: Normocephalic and atraumatic. Right Ear: External ear normal. EAC/TM nl. Left Ear: External ear normal.  EAC/TM nl. Nose: Nose normal. Mouth/Throat: Oropharynx is clear and moist. Dentition - native, in good repair. No buccal or palatal lesions. Posterior pharynx clear. Eyes: Conjunctivae and sclera clear. EOM intact. Pupils are equal, round, and reactive to light. Right eye exhibits no discharge. Left eye exhibits no discharge. Neck: Normal range of motion. Neck supple. No JVD present. No tracheal deviation present. No thyromegaly present.  Cardiovascular: Normal rate, regular rhythm, no gallop, no friction rub, no murmur heard.      Quiet precordium. 2+ radial and DP pulses . No carotid bruits Pulmonary/Chest: Effort normal. No respiratory distress or increased WOB, no wheezes, no rales. No chest wall deformity or CVAT. Abdomen: Soft. Bowel sounds are normal in all quadrants. He exhibits no distension, no tenderness, no  rebound or guarding, No heptosplenomegaly  Genitourinary:  deferred Musculoskeletal: Normal range of motion. He exhibits no edema and no tenderness.       Small and large joints without redness, synovial thickening or deformity. Full range of motion preserved about all small, median and large joints.  Lymphadenopathy:    He has no cervical or supraclavicular adenopathy.  Neurological: He is alert and oriented to person, place, and time. CN II-XII intact. DTRs 2+ and symmetrical biceps, radial and patellar tendons. Cerebellar function normal with no tremor, rigidity, normal gait and station.  Skin: Skin is warm and dry. No rash noted. No erythema.  Psychiatric: He has a normal mood and affect. His behavior is normal. Thought content normal.   Recent Results (from the past 2160 hour(s))  HEPATIC FUNCTION PANEL     Status: None   Collection Time    11/26/13 11:06 AM      Result Value Range   Total Bilirubin 0.8  0.3 - 1.2 mg/dL   Bilirubin, Direct 0.2  0.0 - 0.3 mg/dL    Alkaline Phosphatase 68  39 - 117 U/L   AST 29  0 - 37 U/L   ALT 27  0 - 53 U/L   Total Protein 7.8  6.0 - 8.3 g/dL   Albumin 4.6  3.5 - 5.2 g/dL  PSA     Status: Abnormal   Collection Time    11/26/13 11:06 AM      Result Value Range   PSA 0.00 (*) 0.10 - 4.00 ng/mL  TSH     Status: None   Collection Time    11/26/13 11:06 AM      Result Value Range   TSH 1.45  0.35 - 5.50 uIU/mL  LIPID PANEL     Status: Abnormal   Collection Time    11/26/13 11:06 AM      Result Value Range   Cholesterol 194  0 - 200 mg/dL   Comment: ATP III Classification       Desirable:  < 200 mg/dL               Borderline High:  200 - 239 mg/dL          High:  > = 240 mg/dL   Triglycerides 150.0 (*) 0.0 - 149.0 mg/dL   Comment: Normal:  <150 mg/dLBorderline High:  150 - 199 mg/dL   HDL 55.30  >39.00 mg/dL   VLDL 30.0  0.0 - 40.0 mg/dL   LDL Cholesterol 109 (*) 0 - 99 mg/dL   Total CHOL/HDL Ratio 4     Comment:                Men          Women1/2 Average Risk     3.4          3.3Average Risk          5.0          4.42X Average Risk          9.6          7.13X Average Risk          15.0          11.0                      BASIC METABOLIC PANEL     Status: None   Collection Time    11/26/13 11:06 AM  Result Value Range   Sodium 138  135 - 145 mEq/L   Potassium 4.2  3.5 - 5.1 mEq/L   Chloride 101  96 - 112 mEq/L   CO2 29  19 - 32 mEq/L   Glucose, Bld 96  70 - 99 mg/dL   BUN 17  6 - 23 mg/dL   Creatinine, Ser 0.9  0.4 - 1.5 mg/dL   Calcium 9.9  8.4 - 10.5 mg/dL   GFR 85.86  >60.00 mL/min  URIC ACID     Status: None   Collection Time    11/26/13 11:06 AM      Result Value Range   Uric Acid, Serum 6.7  4.0 - 7.8 mg/dL         Assessment & Plan:

## 2013-11-26 NOTE — Progress Notes (Signed)
Pre visit review using our clinic review tool, if applicable. No additional management support is needed unless otherwise documented below in the visit note. 

## 2013-11-26 NOTE — Patient Instructions (Signed)
Good to see you - how time flies  Your exam is fine.   Lab today - results will be posted to [place] maintenance - current with colonoscopy, immunizations - need information about the Pneumovax, will give Prevnar pneumonia vaccine today.   You appear to be fit. Go ahead and walk 9 holes once or twice a week.   You will be reassigned to another physician here - it is a work in process.

## 2013-11-27 DIAGNOSIS — Z8546 Personal history of malignant neoplasm of prostate: Secondary | ICD-10-CM | POA: Insufficient documentation

## 2013-11-27 NOTE — Assessment & Plan Note (Signed)
Taking and tolerating "statin" therapy. Lab reveals LDL and HDL at or better than goal. Liver functions normal.  Plan Continue present regimen

## 2013-11-27 NOTE — Assessment & Plan Note (Signed)
7 years out from radical prostatectomy - last OV Dr. Alinda Money Feb '14 - PSA indictable. No evidence of recurrence.

## 2013-11-27 NOTE — Assessment & Plan Note (Addendum)
Interval history benign. Physical exam sans GU normal. Lab reviewed - in normal limits with normal Uric Acid, controlled lipids, normal chemistries. Current with colorectal cancer screening due for follow up 2016. Current with GU. Immunizations - will research pneumovax but otherwise current with prevnar today.  In summary - a nice man who is medically stable. Active on the [employer].Marland Kitchen

## 2013-11-27 NOTE — Assessment & Plan Note (Signed)
Follows with Dr. Dutch Gray - last OV Feb '14 - stable

## 2013-11-27 NOTE — Assessment & Plan Note (Signed)
BP Readings from Last 3 Encounters:  11/26/13 160/100  11/25/12 138/88  11/23/11 142/90   Unusually high reading today with previous readings close to goal.  Plan Continue present regimen  Home monitoring of BP with report back by MyChart with recommendations to follow.

## 2013-11-27 NOTE — Assessment & Plan Note (Signed)
No recent flares. Uric acid level in normal range  Plan Continue indomethacin prn

## 2014-08-11 ENCOUNTER — Encounter: Payer: Self-pay | Admitting: Internal Medicine

## 2014-11-16 ENCOUNTER — Other Ambulatory Visit: Payer: Self-pay | Admitting: Geriatric Medicine

## 2014-11-16 MED ORDER — LISINOPRIL 10 MG PO TABS: 10.0000 mg | ORAL_TABLET | Freq: Every day | ORAL | Status: DC

## 2014-11-16 MED ORDER — LOVASTATIN 20 MG PO TABS: 20.0000 mg | ORAL_TABLET | Freq: Every day | ORAL | Status: DC

## 2014-11-26 DIAGNOSIS — C44319 Basal cell carcinoma of skin of other parts of face: Secondary | ICD-10-CM | POA: Diagnosis not present

## 2014-11-26 DIAGNOSIS — C44310 Basal cell carcinoma of skin of unspecified parts of face: Secondary | ICD-10-CM | POA: Diagnosis not present

## 2014-11-26 DIAGNOSIS — L820 Inflamed seborrheic keratosis: Secondary | ICD-10-CM | POA: Diagnosis not present

## 2014-11-27 ENCOUNTER — Encounter: Payer: Self-pay | Admitting: Internal Medicine

## 2014-11-27 ENCOUNTER — Ambulatory Visit (INDEPENDENT_AMBULATORY_CARE_PROVIDER_SITE_OTHER): Payer: Medicare Other | Admitting: Internal Medicine

## 2014-11-27 ENCOUNTER — Other Ambulatory Visit (INDEPENDENT_AMBULATORY_CARE_PROVIDER_SITE_OTHER): Payer: Medicare Other

## 2014-11-27 VITALS — BP 146/82 | HR 64 | Temp 98.3°F | Resp 16 | Ht 70.0 in | Wt 196.8 lb

## 2014-11-27 DIAGNOSIS — M109 Gout, unspecified: Secondary | ICD-10-CM

## 2014-11-27 DIAGNOSIS — Z8546 Personal history of malignant neoplasm of prostate: Secondary | ICD-10-CM

## 2014-11-27 DIAGNOSIS — E785 Hyperlipidemia, unspecified: Secondary | ICD-10-CM

## 2014-11-27 DIAGNOSIS — Z0000 Encounter for general adult medical examination without abnormal findings: Secondary | ICD-10-CM | POA: Diagnosis not present

## 2014-11-27 DIAGNOSIS — I10 Essential (primary) hypertension: Secondary | ICD-10-CM

## 2014-11-27 LAB — BASIC METABOLIC PANEL
BUN: 14 mg/dL (ref 6–23)
CALCIUM: 10.1 mg/dL (ref 8.4–10.5)
CO2: 28 meq/L (ref 19–32)
CREATININE: 0.99 mg/dL (ref 0.40–1.50)
Chloride: 103 mEq/L (ref 96–112)
GFR: 78.67 mL/min (ref >60.00)
Glucose, Bld: 100 mg/dL — ABNORMAL HIGH (ref 70–99)
POTASSIUM: 4.4 meq/L (ref 3.5–5.1)
Sodium: 139 mEq/L (ref 135–145)

## 2014-11-27 LAB — LIPID PANEL
CHOL/HDL RATIO: 3
CHOLESTEROL: 173 mg/dL (ref 0–200)
HDL: 53.2 mg/dL (ref >39.00)
LDL Cholesterol: 82 mg/dL (ref 0–99)
NonHDL: 119.8
Triglycerides: 188 mg/dL — ABNORMAL HIGH (ref 0.0–149.0)
VLDL: 37.6 mg/dL (ref 0.0–40.0)

## 2014-11-27 LAB — PSA: PSA: 0 ng/mL — ABNORMAL LOW (ref 0.10–4.00)

## 2014-11-27 NOTE — Patient Instructions (Signed)
We will check your blood work today and call you back with the results. We are also checking the PSA.   Come back in about 1 year for a check up. If you have any problems or questions before then please feel free to call the office.   Health Maintenance A healthy lifestyle and preventative care can promote health and wellness.  Maintain regular health, dental, and eye exams.  Eat a healthy diet. Foods like vegetables, fruits, whole grains, low-fat dairy products, and lean protein foods contain the nutrients you need and are low in calories. Decrease your intake of foods high in solid fats, added sugars, and salt. Get information about a proper diet from your health care provider, if necessary.  Regular physical exercise is one of the most important things you can do for your health. Most adults should get at least 150 minutes of moderate-intensity exercise (any activity that increases your heart rate and causes you to sweat) each week. In addition, most adults need muscle-strengthening exercises on 2 or more days a week.   Maintain a healthy weight. The body mass index (BMI) is a screening tool to identify possible weight problems. It provides an estimate of body fat based on height and weight. Your health care provider can find your BMI and can help you achieve or maintain a healthy weight. For males 20 years and older:  A BMI below 18.5 is considered underweight.  A BMI of 18.5 to 24.9 is normal.  A BMI of 25 to 29.9 is considered overweight.  A BMI of 30 and above is considered obese.  Maintain normal blood lipids and cholesterol by exercising and minimizing your intake of saturated fat. Eat a balanced diet with plenty of fruits and vegetables. Blood tests for lipids and cholesterol should begin at age 36 and be repeated every 5 years. If your lipid or cholesterol levels are high, you are over age 45, or you are at high risk for heart disease, you may need your cholesterol levels checked  more frequently.Ongoing high lipid and cholesterol levels should be treated with medicines if diet and exercise are not working.  If you smoke, find out from your health care provider how to quit. If you do not use tobacco, do not start.  Lung cancer screening is recommended for adults aged 24-80 years who are at high risk for developing lung cancer because of a history of smoking. A yearly low-dose CT scan of the lungs is recommended for people who have at least a 30-pack-year history of smoking and are current smokers or have quit within the past 15 years. A pack year of smoking is smoking an average of 1 pack of cigarettes a day for 1 year (for example, a 30-pack-year history of smoking could mean smoking 1 pack a day for 30 years or 2 packs a day for 15 years). Yearly screening should continue until the smoker has stopped smoking for at least 15 years. Yearly screening should be stopped for people who develop a health problem that would prevent them from having lung cancer treatment.  If you choose to drink alcohol, do not have more than 2 drinks per day. One drink is considered to be 12 oz (360 mL) of beer, 5 oz (150 mL) of wine, or 1.5 oz (45 mL) of liquor.  Avoid the use of street drugs. Do not share needles with anyone. Ask for help if you need support or instructions about stopping the use of drugs.  High blood  pressure causes heart disease and increases the risk of stroke. Blood pressure should be checked at least every 1-2 years. Ongoing high blood pressure should be treated with medicines if weight loss and exercise are not effective.  If you are 54-60 years old, ask your health care provider if you should take aspirin to prevent heart disease.  Diabetes screening involves taking a blood sample to check your fasting blood sugar level. This should be done once every 3 years after age 52 if you are at a normal weight and without risk factors for diabetes. Testing should be considered at a  younger age or be carried out more frequently if you are overweight and have at least 1 risk factor for diabetes.  Colorectal cancer can be detected and often prevented. Most routine colorectal cancer screening begins at the age of 66 and continues through age 40. However, your health care provider may recommend screening at an earlier age if you have risk factors for colon cancer. On a yearly basis, your health care provider may provide home test kits to check for hidden blood in the stool. A small camera at the end of a tube may be used to directly examine the colon (sigmoidoscopy or colonoscopy) to detect the earliest forms of colorectal cancer. Talk to your health care provider about this at age 16 when routine screening begins. A direct exam of the colon should be repeated every 5-10 years through age 19, unless early forms of precancerous polyps or small growths are found.  People who are at an increased risk for hepatitis B should be screened for this virus. You are considered at high risk for hepatitis B if:  You were born in a country where hepatitis B occurs often. Talk with your health care provider about which countries are considered high risk.  Your parents were born in a high-risk country and you have not received a shot to protect against hepatitis B (hepatitis B vaccine).  You have HIV or AIDS.  You use needles to inject street drugs.  You live with, or have sex with, someone who has hepatitis B.  You are a man who has sex with other men (MSM).  You get hemodialysis treatment.  You take certain medicines for conditions like cancer, organ transplantation, and autoimmune conditions.  Hepatitis C blood testing is recommended for all people born from [birth year] through 1965 and any individual with known risk factors for hepatitis C.  Healthy men should no longer receive prostate-specific antigen (PSA) blood tests as part of routine cancer screening. Talk to your health care provider  about prostate cancer screening.  Testicular cancer screening is not recommended for adolescents or adult males who have no symptoms. Screening includes self-exam, a health care provider exam, and other screening tests. Consult with your health care provider about any symptoms you have or any concerns you have about testicular cancer.  Practice safe sex. Use condoms and avoid high-risk sexual practices to reduce the spread of sexually transmitted infections (STIs).  You should be screened for STIs, including gonorrhea and chlamydia if:  You are sexually active and are younger than 24 years.  You are older than 24 years, and your health care provider tells you that you are at risk for this type of infection.  Your sexual activity has changed since you were last screened, and you are at an increased risk for chlamydia or gonorrhea. Ask your health care provider if you are at risk.  If you are at risk  of being infected with HIV, it is recommended that you take a prescription medicine daily to prevent HIV infection. This is called pre-exposure prophylaxis (PrEP). You are considered at risk if:  You are a man who has sex with other men (MSM).  You are a heterosexual man who is sexually active with multiple partners.  You take drugs by injection.  You are sexually active with a partner who has HIV.  Talk with your health care provider about whether you are at high risk of being infected with HIV. If you choose to begin PrEP, you should first be tested for HIV. You should then be tested every 3 months for as long as you are taking PrEP.  Use sunscreen. Apply sunscreen liberally and repeatedly throughout the day. You should seek shade when your shadow is shorter than you. Protect yourself by wearing long sleeves, pants, a wide-brimmed hat, and sunglasses year round whenever you are outdoors.  Tell your health care provider of new moles or changes in moles, especially if there is a change in shape  or color. Also, tell your health care provider if a mole is larger than the size of a pencil eraser.  A one-time screening for abdominal aortic aneurysm (AAA) and surgical repair of large AAAs by ultrasound is recommended for men aged 58-75 years who are current or former smokers.  Stay current with your vaccines (immunizations). Document Released: 04/27/2008 Document Revised: 11/04/2013 Document Reviewed: 03/27/2011 [facility] Patient Information 2015 [place], [place]. This information is not intended to replace advice given to you by your health care provider. Make sure you discuss any questions you have with your health care provider.

## 2014-11-27 NOTE — Progress Notes (Signed)
Pre visit review using our clinic review tool, if applicable. No additional management support is needed unless otherwise documented below in the visit note. 

## 2014-11-29 NOTE — Assessment & Plan Note (Signed)
Patient just had prevnar shot and will give pneumonia 23 at next year's visit. Due for colonoscopy in 2021, tetanus in 2020. Had flu shot this season.

## 2014-11-29 NOTE — Assessment & Plan Note (Signed)
On lovastatin and will check lipid panel to ensure he is at goal.

## 2014-11-29 NOTE — Assessment & Plan Note (Signed)
Check PSA. ?

## 2014-11-29 NOTE — Assessment & Plan Note (Signed)
No flare in years and has indomethacin if needed.

## 2014-11-29 NOTE — Assessment & Plan Note (Signed)
Doing well on regimen and will check BMP and adjust as needed.

## 2014-11-29 NOTE — Progress Notes (Signed)
   Subjective:    Patient ID: Troy Jenkins, male    DOB: [birth year], 74 y.o.   MRN: [id number]  HPI The patient is a 74 YO man who has PMH of gout (not for many years), hx prostate cancer (due for PSA), HTN. He has no new complaints and is doing well overall.   Here for medicare wellness:  Diet: heart healthy Physical activity: sedentary Depression/mood screen: negative Hearing: intact to whispered voice Visual acuity: grossly normal, performs annual eye exam  ADLs: capable Fall risk: none Home safety: good Cognitive evaluation: intact to orientation, naming, recall and repetition EOL planning: adv directives, full code/ I agree  I have personally reviewed and have noted 1. The patient's medical and social history 2. Their use of alcohol, tobacco or illicit drugs 3. Their current medications and supplements 4. The patient's functional ability including ADL's, fall risks, home safety risks and hearing or visual impairment. 5. Diet and physical activities 6. Evidence for depression or mood disorders 7.  Care team reviewed and updated as appropriate  Review of Systems  Constitutional: Negative for fever, activity change, appetite change, fatigue and unexpected weight change.  HENT: Negative.   Respiratory: Negative for cough, chest tightness, shortness of breath and wheezing.   Cardiovascular: Negative for chest pain, palpitations and leg swelling.  Gastrointestinal: Negative for abdominal pain, diarrhea, constipation and abdominal distention.  Musculoskeletal: Negative.   Neurological: Negative.   Psychiatric/Behavioral: Negative.       Objective:   Physical Exam  Constitutional: He is oriented to person, place, and time. He appears well-developed and well-nourished.  HENT:  Head: Normocephalic and atraumatic.  Eyes: EOM are normal.  Neck: Normal range of motion.  Cardiovascular: Normal rate and regular rhythm.   Pulmonary/Chest: Effort normal and breath sounds  normal. No respiratory distress. He has no wheezes. He has no rales.  Abdominal: Soft. Bowel sounds are normal. He exhibits no distension. There is no tenderness. There is no rebound.  Musculoskeletal: He exhibits no edema.  Neurological: He is alert and oriented to person, place, and time. Coordination normal.  Skin: Skin is warm and dry.   Filed Vitals:   11/27/14 0948  BP: 146/82  Pulse: 64  Temp: 98.3 F (36.8 C)  TempSrc: Oral  Resp: 16  Height: 5\' 10"  (1.778 m)  Weight: 196 lb 12.8 oz (89.268 kg)  SpO2: 98%      Assessment & Plan:

## 2015-01-27 DIAGNOSIS — Z85828 Personal history of other malignant neoplasm of skin: Secondary | ICD-10-CM | POA: Diagnosis not present

## 2015-01-27 DIAGNOSIS — L570 Actinic keratosis: Secondary | ICD-10-CM | POA: Diagnosis not present

## 2015-01-27 DIAGNOSIS — Z08 Encounter for follow-up examination after completed treatment for malignant neoplasm: Secondary | ICD-10-CM | POA: Diagnosis not present

## 2015-01-27 DIAGNOSIS — C44622 Squamous cell carcinoma of skin of right upper limb, including shoulder: Secondary | ICD-10-CM | POA: Diagnosis not present

## 2015-01-27 DIAGNOSIS — X32XXXD Exposure to sunlight, subsequent encounter: Secondary | ICD-10-CM | POA: Diagnosis not present

## 2015-02-02 ENCOUNTER — Other Ambulatory Visit: Payer: Self-pay | Admitting: Geriatric Medicine

## 2015-02-02 MED ORDER — LISINOPRIL 10 MG PO TABS: 10.0000 mg | ORAL_TABLET | Freq: Every day | ORAL | Status: DC

## 2015-02-02 MED ORDER — LOVASTATIN 20 MG PO TABS
20.0000 mg | ORAL_TABLET | Freq: Every day | ORAL | Status: DC
Start: 2015-02-02 — End: 2015-10-26

## 2015-02-17 ENCOUNTER — Telehealth: Payer: Self-pay | Admitting: *Deleted

## 2015-02-17 DIAGNOSIS — C61 Malignant neoplasm of prostate: Secondary | ICD-10-CM | POA: Diagnosis not present

## 2015-02-17 DIAGNOSIS — N5201 Erectile dysfunction due to arterial insufficiency: Secondary | ICD-10-CM | POA: Diagnosis not present

## 2015-02-17 NOTE — Telephone Encounter (Signed)
Left msg on triage stating pt was referred over this afternoon needing pt last 2 PSA fax to [phone]. Faxed last PSA...Johny Chess

## 2015-06-02 ENCOUNTER — Encounter: Payer: Self-pay | Admitting: Internal Medicine

## 2015-09-07 ENCOUNTER — Encounter: Payer: Self-pay | Admitting: Internal Medicine

## 2015-10-26 ENCOUNTER — Other Ambulatory Visit: Payer: Self-pay | Admitting: Internal Medicine

## 2015-11-14 HISTORY — PX: COLONOSCOPY: SHX174

## 2015-11-14 HISTORY — PX: POLYPECTOMY: SHX149

## 2015-11-16 ENCOUNTER — Ambulatory Visit (AMBULATORY_SURGERY_CENTER): Payer: Self-pay

## 2015-11-16 VITALS — Ht 70.0 in | Wt 198.8 lb

## 2015-11-16 DIAGNOSIS — Z860100 Personal history of colon polyps, unspecified: Secondary | ICD-10-CM

## 2015-11-16 MED ORDER — SUPREP BOWEL PREP KIT 17.5-3.13-1.6 GM/177ML PO SOLN: 1.0000 | Freq: Once | ORAL | Status: DC

## 2015-11-16 NOTE — Progress Notes (Signed)
No allergies to eggs or soy No home oxygen No diet/weight loss meds No past problems with anesthesia  Has email and internet; refused emmi

## 2015-11-22 ENCOUNTER — Telehealth: Payer: Self-pay

## 2015-11-22 NOTE — Telephone Encounter (Signed)
Spoke with patient and moved his appointment to 11:00am.  Reviewed the changes on the instructions for when to drink 2nd prep, etc.  Patient acknowledged and understood.

## 2015-11-23 ENCOUNTER — Ambulatory Visit (AMBULATORY_SURGERY_CENTER): Payer: Medicare Other | Admitting: Internal Medicine

## 2015-11-23 ENCOUNTER — Encounter: Payer: Self-pay | Admitting: Internal Medicine

## 2015-11-23 VITALS — BP 114/80 | HR 65 | Temp 98.1°F | Resp 16 | Ht 70.0 in | Wt 198.0 lb

## 2015-11-23 DIAGNOSIS — D123 Benign neoplasm of transverse colon: Secondary | ICD-10-CM | POA: Diagnosis not present

## 2015-11-23 DIAGNOSIS — D122 Benign neoplasm of ascending colon: Secondary | ICD-10-CM

## 2015-11-23 DIAGNOSIS — Z86010 Personal history of colonic polyps: Secondary | ICD-10-CM

## 2015-11-23 DIAGNOSIS — D120 Benign neoplasm of cecum: Secondary | ICD-10-CM

## 2015-11-23 MED ORDER — SODIUM CHLORIDE 0.9 % IV SOLN: 500.0000 mL | INTRAVENOUS | Status: DC

## 2015-11-23 NOTE — Patient Instructions (Signed)
YOU HAD AN ENDOSCOPIC PROCEDURE TODAY AT THE [facility]:   Refer to the procedure report that was given to you for any specific questions about what was found during the examination.  If the procedure report does not answer your questions, please call your gastroenterologist to clarify.  If you requested that your care partner not be given the details of your procedure findings, then the procedure report has been included in a sealed envelope for you to review at your convenience later.  YOU SHOULD EXPECT: Some feelings of bloating in the abdomen. Passage of more gas than usual.  Walking can help get rid of the air that was put into your GI tract during the procedure and reduce the bloating. If you had a lower endoscopy (such as a colonoscopy or flexible sigmoidoscopy) you may notice spotting of blood in your stool or on the toilet paper. If you underwent a bowel prep for your procedure, you may not have a normal bowel movement for a few days.  Please Note:  You might notice some irritation and congestion in your nose or some drainage.  This is from the oxygen used during your procedure.  There is no need for concern and it should clear up in a day or so.  SYMPTOMS TO REPORT IMMEDIATELY:   Following lower endoscopy (colonoscopy or flexible sigmoidoscopy):  Excessive amounts of blood in the stool  Significant tenderness or worsening of abdominal pains  Swelling of the abdomen that is new, acute  Fever of 100F or higher   For urgent or emergent issues, a gastroenterologist can be reached at any hour by calling [phone].   DIET: Your first meal following the procedure should be a small meal and then it is ok to progress to your normal diet. Heavy or fried foods are harder to digest and may make you feel nauseous or bloated.  Likewise, meals heavy in dairy and vegetables can increase bloating.  Drink plenty of fluids but you should avoid alcoholic beverages for 24  hours.  ACTIVITY:  You should plan to take it easy for the rest of today and you should NOT DRIVE or use heavy machinery until tomorrow (because of the sedation medicines used during the test).    FOLLOW UP: Our staff will call the number listed on your records the next business day following your procedure to check on you and address any questions or concerns that you may have regarding the information given to you following your procedure. If we do not reach you, we will leave a message.  However, if you are feeling well and you are not experiencing any problems, there is no need to return our call.  We will assume that you have returned to your regular daily activities without incident.  If any biopsies were taken you will be contacted by phone or by letter within the next 1-3 weeks.  Please call us at [phone] if you have not heard about the biopsies in 3 weeks.    SIGNATURES/CONFIDENTIALITY: You and/or your care partner have signed paperwork which will be entered into your electronic medical record.  These signatures attest to the fact that that the information above on your After Visit Summary has been reviewed and is understood.  Full responsibility of the confidentiality of this discharge information lies with you and/or your care-partner. 

## 2015-11-23 NOTE — Progress Notes (Signed)
Report to PACU, RN, vss, BBS= Clear.  

## 2015-11-23 NOTE — Progress Notes (Signed)
Called to room to assist during endoscopic procedure.  Patient ID and intended procedure confirmed with present staff. Received instructions for my participation in the procedure from the performing physician.  

## 2015-11-23 NOTE — Op Note (Signed)
[facility]  [address] [place], [id number]   COLONOSCOPY PROCEDURE REPORT  PATIENT: Troy Jenkins, Troy Jenkins  MR#: [id number] BIRTHDATE: 06-20-1941 , 74  yrs. old GENDER: male ENDOSCOPIST: Eustace Quail, MD REFERRED [id number] Program Recall PROCEDURE DATE:  11/23/2015 PROCEDURE:   Colonoscopy, surveillance and Colonoscopy with snare polypectomy x 3 First Screening Colonoscopy - Avg.  risk and is 50 yrs.  old or older - No.  Prior Negative Screening - Now for repeat screening. N/A  History of Adenoma - Now for follow-up colonoscopy & has been > or = to 3 yrs.  Yes hx of adenoma.  Has been 3 or more years since last colonoscopy.  Polyps removed today? Yes ASA CLASS:   Class II INDICATIONS:Surveillance due to prior colonic neoplasia and PH Colon Adenoma.  . Index exam 2000 (negative); last exam September 2011 with small tubular adenoma MEDICATIONS: Monitored anesthesia care and Propofol 240 mg IV  DESCRIPTION OF PROCEDURE:   After the risks benefits and alternatives of the procedure were thoroughly explained, informed consent was obtained.  The digital rectal exam revealed no abnormalities of the rectum.   The LB [id number]  endoscope was introduced through the anus and advanced to the cecum, which was identified by both the appendix and ileocecal valve. No adverse events experienced.   The quality of the prep was excellent. (Suprep was used)  The instrument was then slowly withdrawn as the colon was fully examined. Estimated blood loss is zero unless otherwise noted in this procedure report.  COLON FINDINGS: Three polyps ranging from 2 to 54mm in size were found at the cecum and in the transverse colon.  A polypectomy was performed with a cold snare.  The resection was complete, the polyp tissue was completely retrieved and sent to histology.   There was moderate diverticulosis noted in the right colon and left colon. The examination was otherwise  normal.  Retroflexed views revealed internal hemorrhoids. The time to cecum = 2.4 Withdrawal time = 10.2   The scope was withdrawn and the procedure completed. COMPLICATIONS: There were no immediate complications.  ENDOSCOPIC IMPRESSION: 1.   Three polyps were found at the cecum and in the transverse colon; polypectomy was performed with a cold snare 2.   Moderate diverticulosis was noted in the right colon and left colon 3.   The examination was otherwise normal  RECOMMENDATIONS: 1. Repeat colonoscopy in 5 years if polyp adenomatous; otherwise follow-up as needed  eSigned:  Eustace Quail, MD 11/23/2015 10:40 AM   cc: The Patient and Pricilla Holm, MD

## 2015-11-24 ENCOUNTER — Telehealth: Payer: Self-pay

## 2015-11-24 NOTE — Telephone Encounter (Signed)
  Follow up Call-  Call back number 11/23/2015  Post procedure Call Back phone  # [phone]  Permission to leave phone message Yes     Patient questions:  Do you have a fever, pain , or abdominal swelling? No. Pain Score  0 *  Have you tolerated food without any problems? Yes.    Have you been able to return to your normal activities? Yes.    Do you have any questions about your discharge instructions: Diet   No. Medications  No. Follow up visit  No.  Do you have questions or concerns about your Care? No.  Actions: * If pain score is 4 or above: No action needed, pain <4.

## 2015-11-29 ENCOUNTER — Other Ambulatory Visit (INDEPENDENT_AMBULATORY_CARE_PROVIDER_SITE_OTHER): Payer: Medicare Other

## 2015-11-29 ENCOUNTER — Telehealth: Payer: Self-pay | Admitting: Internal Medicine

## 2015-11-29 ENCOUNTER — Ambulatory Visit (INDEPENDENT_AMBULATORY_CARE_PROVIDER_SITE_OTHER): Payer: Medicare Other | Admitting: Internal Medicine

## 2015-11-29 ENCOUNTER — Encounter: Payer: Self-pay | Admitting: Internal Medicine

## 2015-11-29 VITALS — BP 182/98 | HR 71 | Temp 98.4°F | Resp 16 | Ht 70.0 in | Wt 195.0 lb

## 2015-11-29 DIAGNOSIS — I10 Essential (primary) hypertension: Secondary | ICD-10-CM | POA: Diagnosis not present

## 2015-11-29 DIAGNOSIS — Z8546 Personal history of malignant neoplasm of prostate: Secondary | ICD-10-CM

## 2015-11-29 DIAGNOSIS — Z23 Encounter for immunization: Secondary | ICD-10-CM | POA: Diagnosis not present

## 2015-11-29 DIAGNOSIS — M109 Gout, unspecified: Secondary | ICD-10-CM

## 2015-11-29 DIAGNOSIS — Z0000 Encounter for general adult medical examination without abnormal findings: Secondary | ICD-10-CM | POA: Diagnosis not present

## 2015-11-29 DIAGNOSIS — E785 Hyperlipidemia, unspecified: Secondary | ICD-10-CM | POA: Diagnosis not present

## 2015-11-29 LAB — COMPREHENSIVE METABOLIC PANEL
ALBUMIN: 4.5 g/dL (ref 3.5–5.2)
ALT: 18 U/L (ref 0–53)
AST: 24 U/L (ref 0–37)
Alkaline Phosphatase: 68 U/L (ref 39–117)
BILIRUBIN TOTAL: 0.5 mg/dL (ref 0.2–1.2)
BUN: 12 mg/dL (ref 6–23)
CALCIUM: 10 mg/dL (ref 8.4–10.5)
CO2: 24 mEq/L (ref 19–32)
CREATININE: 1 mg/dL (ref 0.40–1.50)
Chloride: 104 mEq/L (ref 96–112)
GFR: 77.55 mL/min (ref >60.00)
Glucose, Bld: 101 mg/dL — ABNORMAL HIGH (ref 70–99)
Potassium: 4.4 mEq/L (ref 3.5–5.1)
Sodium: 143 mEq/L (ref 135–145)
Total Protein: 7.4 g/dL (ref 6.0–8.3)

## 2015-11-29 LAB — LIPID PANEL
CHOLESTEROL: 178 mg/dL (ref 0–200)
HDL: 50.7 mg/dL (ref >39.00)
LDL Cholesterol: 92 mg/dL (ref 0–99)
NonHDL: 127.42
TRIGLYCERIDES: 176 mg/dL — AB (ref 0.0–149.0)
Total CHOL/HDL Ratio: 4
VLDL: 35.2 mg/dL (ref 0.0–40.0)

## 2015-11-29 LAB — PSA: PSA: 0 ng/mL — ABNORMAL LOW (ref 0.10–4.00)

## 2015-11-29 MED ORDER — INDOMETHACIN 25 MG PO CAPS: 25.0000 mg | ORAL_CAPSULE | Freq: Three times a day (TID) | ORAL | Status: DC | PRN

## 2015-11-29 MED ORDER — LISINOPRIL 10 MG PO TABS: ORAL_TABLET | ORAL | Status: DC

## 2015-11-29 MED ORDER — LOVASTATIN 20 MG PO TABS: ORAL_TABLET | ORAL | Status: DC

## 2015-11-29 NOTE — Telephone Encounter (Signed)
Let pt know path report is not back yet.

## 2015-11-29 NOTE — Assessment & Plan Note (Signed)
Uses indomethacin rarely and given refill today as his is expired.

## 2015-11-29 NOTE — Patient Instructions (Signed)
We have checked the EKG which is not changed from the last one.   We will check the labs today and call you back with the results.   We have given you the pneumonia shot today that covers for 23 kinds of pneumonia which it does not appear that you have had. This will finish your pneumonia shots.   Check the blood pressure at least 2-3 times per month to make sure it is still doing well.   Health Maintenance, Male A healthy lifestyle and preventative care can promote health and wellness.  Maintain regular health, dental, and eye exams.  Eat a healthy diet. Foods like vegetables, fruits, whole grains, low-fat dairy products, and lean protein foods contain the nutrients you need and are low in calories. Decrease your intake of foods high in solid fats, added sugars, and salt. Get information about a proper diet from your health care provider, if necessary.  Regular physical exercise is one of the most important things you can do for your health. Most adults should get at least 150 minutes of moderate-intensity exercise (any activity that increases your heart rate and causes you to sweat) each week. In addition, most adults need muscle-strengthening exercises on 2 or more days a week.   Maintain a healthy weight. The body mass index (BMI) is a screening tool to identify possible weight problems. It provides an estimate of body fat based on height and weight. Your health care provider can find your BMI and can help you achieve or maintain a healthy weight. For males 20 years and older:  A BMI below 18.5 is considered underweight.  A BMI of 18.5 to 24.9 is normal.  A BMI of 25 to 29.9 is considered overweight.  A BMI of 30 and above is considered obese.  Maintain normal blood lipids and cholesterol by exercising and minimizing your intake of saturated fat. Eat a balanced diet with plenty of fruits and vegetables. Blood tests for lipids and cholesterol should begin at age 38 and be repeated  every 5 years. If your lipid or cholesterol levels are high, you are over age 49, or you are at high risk for heart disease, you may need your cholesterol levels checked more frequently.Ongoing high lipid and cholesterol levels should be treated with medicines if diet and exercise are not working.  If you smoke, find out from your health care provider how to quit. If you do not use tobacco, do not start.  Lung cancer screening is recommended for adults aged 24-80 years who are at high risk for developing lung cancer because of a history of smoking. A yearly low-dose CT scan of the lungs is recommended for people who have at least a 30-pack-year history of smoking and are current smokers or have quit within the past 15 years. A pack year of smoking is smoking an average of 1 pack of cigarettes a day for 1 year (for example, a 30-pack-year history of smoking could mean smoking 1 pack a day for 30 years or 2 packs a day for 15 years). Yearly screening should continue until the smoker has stopped smoking for at least 15 years. Yearly screening should be stopped for people who develop a health problem that would prevent them from having lung cancer treatment.  If you choose to drink alcohol, do not have more than 2 drinks per day. One drink is considered to be 12 oz (360 mL) of beer, 5 oz (150 mL) of wine, or 1.5 oz (45 mL)  of liquor.  Avoid the use of street drugs. Do not share needles with anyone. Ask for help if you need support or instructions about stopping the use of drugs.  High blood pressure causes heart disease and increases the risk of stroke. High blood pressure is more likely to develop in:  People who have blood pressure in the end of the normal range (100-139/85-89 mm Hg).  People who are overweight or obese.  People who are African American.  If you are 12-87 years of age, have your blood pressure checked every 3-5 years. If you are 47 years of age or older, have your blood pressure  checked every year. You should have your blood pressure measured twice--once when you are at a hospital or clinic, and once when you are not at a hospital or clinic. Record the average of the two measurements. To check your blood pressure when you are not at a hospital or clinic, you can use:  An automated blood pressure machine at a pharmacy.  A home blood pressure monitor.  If you are 50-61 years old, ask your health care provider if you should take aspirin to prevent heart disease.  Diabetes screening involves taking a blood sample to check your fasting blood sugar level. This should be done once every 3 years after age 52 if you are at a normal weight and without risk factors for diabetes. Testing should be considered at a younger age or be carried out more frequently if you are overweight and have at least 1 risk factor for diabetes.  Colorectal cancer can be detected and often prevented. Most routine colorectal cancer screening begins at the age of 81 and continues through age 59. However, your health care provider may recommend screening at an earlier age if you have risk factors for colon cancer. On a yearly basis, your health care provider may provide home test kits to check for hidden blood in the stool. A small camera at the end of a tube may be used to directly examine the colon (sigmoidoscopy or colonoscopy) to detect the earliest forms of colorectal cancer. Talk to your health care provider about this at age 16 when routine screening begins. A direct exam of the colon should be repeated every 5-10 years through age 77, unless early forms of precancerous polyps or small growths are found.  People who are at an increased risk for hepatitis B should be screened for this virus. You are considered at high risk for hepatitis B if:  You were born in a country where hepatitis B occurs often. Talk with your health care provider about which countries are considered high risk.  Your parents were  born in a high-risk country and you have not received a shot to protect against hepatitis B (hepatitis B vaccine).  You have HIV or AIDS.  You use needles to inject street drugs.  You live with, or have sex with, someone who has hepatitis B.  You are a man who has sex with other men (MSM).  You get hemodialysis treatment.  You take certain medicines for conditions like cancer, organ transplantation, and autoimmune conditions.  Hepatitis C blood testing is recommended for all people born from [birth year] through 1965 and any individual with known risk factors for hepatitis C.  Healthy men should no longer receive prostate-specific antigen (PSA) blood tests as part of routine cancer screening. Talk to your health care provider about prostate cancer screening.  Testicular cancer screening is not recommended for adolescents or  adult males who have no symptoms. Screening includes self-exam, a health care provider exam, and other screening tests. Consult with your health care provider about any symptoms you have or any concerns you have about testicular cancer.  Practice safe sex. Use condoms and avoid high-risk sexual practices to reduce the spread of sexually transmitted infections (STIs).  You should be screened for STIs, including gonorrhea and chlamydia if:  You are sexually active and are younger than 24 years.  You are older than 24 years, and your health care provider tells you that you are at risk for this type of infection.  Your sexual activity has changed since you were last screened, and you are at an increased risk for chlamydia or gonorrhea. Ask your health care provider if you are at risk.  If you are at risk of being infected with HIV, it is recommended that you take a prescription medicine daily to prevent HIV infection. This is called pre-exposure prophylaxis (PrEP). You are considered at risk if:  You are a man who has sex with other men (MSM).  You are a heterosexual man who  is sexually active with multiple partners.  You take drugs by injection.  You are sexually active with a partner who has HIV.  Talk with your health care provider about whether you are at high risk of being infected with HIV. If you choose to begin PrEP, you should first be tested for HIV. You should then be tested every 3 months for as long as you are taking PrEP.  Use sunscreen. Apply sunscreen liberally and repeatedly throughout the day. You should seek shade when your shadow is shorter than you. Protect yourself by wearing long sleeves, pants, a wide-brimmed hat, and sunglasses year round whenever you are outdoors.  Tell your health care provider of new moles or changes in moles, especially if there is a change in shape or color. Also, tell your health care provider if a mole is larger than the size of a pencil eraser.  A one-time screening for abdominal aortic aneurysm (AAA) and surgical repair of large AAAs by ultrasound is recommended for men aged 47-75 years who are current or former smokers.  Stay current with your vaccines (immunizations).   This information is not intended to replace advice given to you by your health care provider. Make sure you discuss any questions you have with your health care provider.   Document Released: 04/27/2008 Document Revised: 11/20/2014 Document Reviewed: 03/27/2011 Elsevier Interactive Patient Education [employer].

## 2015-11-29 NOTE — Assessment & Plan Note (Signed)
On lovastatin without side effects, lipid panel at today's visit.

## 2015-11-29 NOTE — Progress Notes (Signed)
Pre visit review using our clinic review tool, if applicable. No additional management support is needed unless otherwise documented below in the visit note. 

## 2015-11-29 NOTE — Assessment & Plan Note (Signed)
Checking PSA and forward to Dr. Alinda Money. Is nearing 10 years free.

## 2015-11-29 NOTE — Progress Notes (Signed)
   Subjective:    Patient ID: Troy Jenkins, male    DOB: 1941/07/01, 75 y.o.   MRN: [id number]  HPI Here for medicare wellness, no new complaints. Please see A/P for status and treatment of chronic medical problems.   HPI #2: Here for follow up of his blood pressure (taking lisinopril, not exercising lately, denies headaches, chest pains, SOB, abdominal pain or nausea) and his history of prostate cancer (10 years out, PSA undetected every year, he requests that we check today and forward to Dr. Alinda Money).   Diet: heart healthy  Physical activity: sedentary Depression/mood screen: negative Hearing: intact to whispered voice Visual acuity: grossly normal, performs annual eye exam  ADLs: capable Fall risk: none Home safety: good Cognitive evaluation: intact to orientation, naming, recall and repetition EOL planning: adv directives discussed  I have personally reviewed and have noted 1. The patient's medical and social history - reviewed today no changes 2. Their use of alcohol, tobacco or illicit drugs 3. Their current medications and supplements 4. The patient's functional ability including ADL's, fall risks, home safety risks and hearing or visual impairment. 5. Diet and physical activities 6. Evidence for depression or mood disorders 7. Care team reviewed and updated (available in snapshot)  Review of Systems  Constitutional: Negative for fever, activity change, appetite change, fatigue and unexpected weight change.  HENT: Negative.   Eyes: Negative.   Respiratory: Negative for cough, chest tightness, shortness of breath and wheezing.   Cardiovascular: Negative for chest pain, palpitations and leg swelling.  Gastrointestinal: Negative for abdominal pain, diarrhea, constipation and abdominal distention.  Musculoskeletal: Positive for back pain. Negative for myalgias, arthralgias and gait problem.  Skin: Negative.   Neurological: Negative.   Psychiatric/Behavioral: Negative.        Objective:   Physical Exam  Constitutional: He is oriented to person, place, and time. He appears well-developed and well-nourished.  HENT:  Head: Normocephalic and atraumatic.  Eyes: EOM are normal.  Neck: Normal range of motion.  Cardiovascular: Normal rate and regular rhythm.   Pulmonary/Chest: Effort normal and breath sounds normal. No respiratory distress. He has no wheezes. He has no rales.  Abdominal: Soft. Bowel sounds are normal. He exhibits no distension. There is no tenderness. There is no rebound.  Musculoskeletal: He exhibits no edema.  Neurological: He is alert and oriented to person, place, and time. Coordination normal.  Skin: Skin is warm and dry.  Psychiatric: He has a normal mood and affect.   Filed Vitals:   11/29/15 1032 11/29/15 1107  BP: 198/100 182/98  Pulse: 71   Temp: 98.4 F (36.9 C)   TempSrc: Oral   Resp: 16   Height: 5\' 10"  (1.778 m)   Weight: 195 lb (88.451 kg)   SpO2: 98%    EKG: axis normal, intervals normal, sinus, rate 70, mild LVH, not able to view prior from muse 2011 but described as normal sinus   Assessment & Plan:  Pneumonia 23 given at visit.

## 2015-11-29 NOTE — Assessment & Plan Note (Addendum)
BP elevated today with his back pain (did not take medicine this morning) and he will monitor at home. Previously normal at home and in the office. Checking labs and adjust as needed. On lisinopril alone.

## 2015-11-29 NOTE — Assessment & Plan Note (Signed)
Had colonoscopy this year (unable to see pathology so due in 5 years or does not need another screening). Given pneumonia 23 to complete pneumonia series, flu shot and tetanus up to date. Shingles shot done. Counseled about the need for exercise and home BP monitoring. Checking labs and adjust as needed. 10 year screening recommendations given to patient at visit.

## 2015-11-30 ENCOUNTER — Encounter: Payer: Self-pay | Admitting: Internal Medicine

## 2016-03-22 ENCOUNTER — Ambulatory Visit (INDEPENDENT_AMBULATORY_CARE_PROVIDER_SITE_OTHER): Payer: Medicare Other | Admitting: Family

## 2016-03-22 ENCOUNTER — Encounter: Payer: Self-pay | Admitting: Family

## 2016-03-22 VITALS — BP 106/66 | HR 99 | Temp 101.2°F | Resp 18 | Wt 193.0 lb

## 2016-03-22 DIAGNOSIS — R112 Nausea with vomiting, unspecified: Secondary | ICD-10-CM

## 2016-03-22 MED ORDER — ONDANSETRON HCL 4 MG/2ML IJ SOLN
4.0000 mg | Freq: Once | INTRAMUSCULAR | Status: AC
  Administered 2016-03-22: 4 mg via INTRAMUSCULAR

## 2016-03-22 MED ORDER — ONDANSETRON 4 MG PO TBDP: 4.0000 mg | ORAL_TABLET | Freq: Three times a day (TID) | ORAL | Status: DC | PRN

## 2016-03-22 NOTE — Patient Instructions (Addendum)
Come back for abdominal x-ray and labs tomorrow.  Please drink lots of fluids. Bland diet as tolerated. If you are unable to get fluids down evening, please get emergency room for IV fluids.  If there is no improvement in your symptoms, or if there is any worsening of symptoms, or if you have any additional concerns, please return for re-evaluation; or, if we are closed, consider going to the Emergency Room for evaluation if symptoms urgent.  Nausea and Vomiting Nausea is a sick feeling that often comes before throwing up (vomiting). Vomiting is a reflex where stomach contents come out of your mouth. Vomiting can cause severe loss of body fluids (dehydration). Children and elderly adults can become dehydrated quickly, especially if they also have diarrhea. Nausea and vomiting are symptoms of a condition or disease. It is important to find the cause of your symptoms. CAUSES   Direct irritation of the stomach lining. This irritation can result from increased acid production (gastroesophageal reflux disease), infection, food poisoning, taking certain medicines (such as nonsteroidal anti-inflammatory drugs), alcohol use, or tobacco use.  Signals from the brain.These signals could be caused by a headache, heat exposure, an inner ear disturbance, increased pressure in the brain from injury, infection, a tumor, or a concussion, pain, emotional stimulus, or metabolic problems.  An obstruction in the gastrointestinal tract (bowel obstruction).  Illnesses such as diabetes, hepatitis, gallbladder problems, appendicitis, kidney problems, cancer, sepsis, atypical symptoms of a heart attack, or eating disorders.  Medical treatments such as chemotherapy and radiation.  Receiving medicine that makes you sleep (general anesthetic) during surgery. DIAGNOSIS Your caregiver may ask for tests to be done if the problems do not improve after a few days. Tests may also be done if symptoms are severe or if the reason  for the nausea and vomiting is not clear. Tests may include:  Urine tests.  Blood tests.  Stool tests.  Cultures (to look for evidence of infection).  X-rays or other imaging studies. Test results can help your caregiver make decisions about treatment or the need for additional tests. TREATMENT You need to stay well hydrated. Drink frequently but in small amounts.You may wish to drink water, sports drinks, clear broth, or eat frozen ice pops or gelatin dessert to help stay hydrated.When you eat, eating slowly may help prevent nausea.There are also some antinausea medicines that may help prevent nausea. HOME CARE INSTRUCTIONS   Take all medicine as directed by your caregiver.  If you do not have an appetite, do not force yourself to eat. However, you must continue to drink fluids.  If you have an appetite, eat a normal diet unless your caregiver tells you differently.  Eat a variety of complex carbohydrates (rice, wheat, potatoes, bread), lean meats, yogurt, fruits, and vegetables.  Avoid high-fat foods because they are more difficult to digest.  Drink enough water and fluids to keep your urine clear or pale yellow.  If you are dehydrated, ask your caregiver for specific rehydration instructions. Signs of dehydration may include:  Severe thirst.  Dry lips and mouth.  Dizziness.  Dark urine.  Decreasing urine frequency and amount.  Confusion.  Rapid breathing or pulse. SEEK IMMEDIATE MEDICAL CARE IF:   You have blood or brown flecks (like coffee grounds) in your vomit.  You have black or bloody stools.  You have a severe headache or stiff neck.  You are confused.  You have severe abdominal pain.  You have chest pain or trouble breathing.  You do not  urinate at least once every 8 hours.  You develop cold or clammy skin.  You continue to vomit for longer than 24 to 48 hours.  You have a fever. MAKE SURE YOU:   Understand these instructions.  Will  watch your condition.  Will get help right away if you are not doing well or get worse.   This information is not intended to replace advice given to you by your health care provider. Make sure you discuss any questions you have with your health care provider.   Document Released: 10/30/2005 Document Revised: 01/22/2012 Document Reviewed: 03/29/2011 Elsevier Interactive Patient Education [employer].

## 2016-03-22 NOTE — Progress Notes (Signed)
Subjective:    Patient ID: Troy Jenkins, male    DOB: 02-04-1941, 75 y.o.   MRN: [id number]   Troy Jenkins is a 75 y.o. male who presents today for an acute visit.    HPI Comments: Patient here for evaluaiton of nausea x 3 days, one episode of vomiting yesterday with bile, no blood. Feels like moving around made him throw up. No sick contacts. Poor appetite and fluid intake. Today had crackers and peanut butter; also had oatmeal this morning without vomiting. Endorses chills, fever, and dry heaves today. Last BM 4 days ago. Passing small amount of gas. Hasn't tried any medication for nausea. No abdominal pain a/w food, increase in gas. Urinating normally.  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 No h/o gallbladder disease, PUD, colon cancer,GERD.   Past Medical History  Diagnosis Date  . GOUT 08/13/2007  . HYPERLIPIDEMIA 08/13/2007  . HYPERTENSION 08/13/2007  . 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 [facility])     prostate  . 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 Ligament tear 06/22/12    left wrist    Allergies: Amoxicillin; Doxycycline; and Iodine Current Outpatient Prescriptions on File Prior to Visit  Medication Sig Dispense Refill  . aspirin 81 MG tablet Take 81 mg by mouth daily.      Marland Kitchen glucosamine-chondroitin 500-400 MG tablet Take 1 tablet by mouth 2 (two) times daily with a meal. 180 tablet 3  . indomethacin (INDOCIN) 25 MG capsule Take 1 capsule (25 mg total) by mouth every 8 (eight) hours as needed. 90 capsule 0  . lisinopril (PRINIVIL,ZESTRIL) 10 MG tablet Take 1 tablet by mouth  daily 90 tablet 3  . lovastatin (MEVACOR) 20 MG tablet Take 1 tablet by mouth at  bedtime 90 tablet 3  . Multiple Vitamin (MULTIVITAMIN) tablet Take 1 tablet by mouth daily.       No current facility-administered medications on file prior to visit.    Social History  Substance Use Topics  . Smoking status: Former Smoker    Quit date:  05/31/1968  . Smokeless tobacco: Never Used  . Alcohol Use: 0.0 oz/week    0 Standard drinks or equivalent per week     Comment: occasionally    Review of Systems  Constitutional: Negative for fever and chills.  Respiratory: Negative for cough.   Cardiovascular: Negative for chest pain and palpitations.  Gastrointestinal: Positive for nausea and vomiting. Negative for abdominal pain, diarrhea, constipation and abdominal distention.  Musculoskeletal: Positive for myalgias.  Neurological: Positive for dizziness (occasional). Negative for syncope and headaches.      Objective:    BP 144/86 mmHg  Pulse 99  Temp(Src) 101.2 F (38.4 C) (Oral)  Resp 18  Wt 193 lb (87.544 kg)  SpO2 94%   Physical Exam  Constitutional: He appears well-developed and well-nourished.  HENT:  Dry mucous membranes  Cardiovascular: Regular rhythm and normal heart sounds.   Pulmonary/Chest: Effort normal and breath sounds normal. No respiratory distress. He has no wheezes. He has no rhonchi. He has no rales.  Abdominal: Soft. Normal appearance and bowel sounds are normal. He exhibits no distension, no ascites and no mass. There is no tenderness. There is no rigidity, no rebound, no guarding, no tenderness at McBurney's point and negative Murphy's sign.  Lymphadenopathy:       Head (left side): No submandibular and no preauricular adenopathy present.  Neurological: He is alert.  Skin: Skin is warm and dry.  Psychiatric: He has a normal mood and affect. His speech is normal and behavior is normal.  Vitals reviewed.   Orthostatic BP:  lying 136/74, sitting 126/68, and standing 106/60.     Assessment & Plan:    1. Non-intractable vomiting with nausea, vomiting of unspecified type  Diagnosis of bowel gastritis supported by chills and fever. Patient has orthostatic hypotension and I am concerned that he is dehydrated. Our lab is closed at this time, unable to get basic lab work until tomorrow. Patient and  I jointly decided if we can control his nausea, he felt that he could increase his oral hydration. Patient does not look septic and does not have an acute abdomen. No abdominal pain on exam. Negative Murphy's sign. Patient is passing gas however his last BM was 4 days ago; I am considering a partial obstruction. He is to come for an abdominal x-ray tomorrow.   Had a strong discussion with patient and reemphasized the importance that he if he is unable to get plenty of fluids down tonight, he will need to go the emergency room for IV fluids and evaluation.  - DG Abd 2 Views; Future - ondansetron (ZOFRAN ODT) 4 MG disintegrating tablet; Take 1 tablet (4 mg total) by mouth every 8 (eight) hours as needed for nausea or vomiting.  Dispense: 20 tablet; Refill: 0 - Basic metabolic panel; Future - ondansetron (ZOFRAN) injection 4 mg; Inject 2 mLs (4 mg total) into the muscle once.   I am having Troy Jenkins maintain his aspirin, multivitamin, glucosamine-chondroitin, lovastatin, lisinopril, and indomethacin.   No orders of the defined types were placed in this encounter.     Start medications as prescribed and explained to patient on After Visit Summary ( AVS). Risks, benefits, and alternatives of the medications and treatment plan prescribed today were discussed, and patient expressed understanding.   Education regarding symptom management and diagnosis given to patient.   Follow-up:Plan follow-up as discussed or as needed if any worsening symptoms or change in condition.   Continue to follow with Hoyt Koch, MD for routine health maintenance.   Nuala Alpha and I agreed with plan.   Mable Paris, FNP

## 2016-03-22 NOTE — Progress Notes (Signed)
Pre visit review using our clinic review tool, if applicable. No additional management support is needed unless otherwise documented below in the visit note. 

## 2016-03-23 ENCOUNTER — Ambulatory Visit (INDEPENDENT_AMBULATORY_CARE_PROVIDER_SITE_OTHER)
Admission: RE | Admit: 2016-03-23 | Discharge: 2016-03-23 | Disposition: A | Discharge status: Home / Self Care | Payer: Medicare Other | Source: Ambulatory Visit | Attending: Family | Admitting: Family

## 2016-03-23 ENCOUNTER — Other Ambulatory Visit (INDEPENDENT_AMBULATORY_CARE_PROVIDER_SITE_OTHER): Payer: Medicare Other

## 2016-03-23 DIAGNOSIS — E2839 Other primary ovarian failure: Secondary | ICD-10-CM

## 2016-03-23 DIAGNOSIS — R112 Nausea with vomiting, unspecified: Secondary | ICD-10-CM

## 2016-03-23 LAB — BASIC METABOLIC PANEL
BUN: 26 mg/dL — ABNORMAL HIGH (ref 6–23)
CALCIUM: 9.1 mg/dL (ref 8.4–10.5)
CO2: 26 mEq/L (ref 19–32)
Chloride: 97 mEq/L (ref 96–112)
Creatinine, Ser: 1.37 mg/dL (ref 0.40–1.50)
GFR: 53.88 mL/min — AB (ref >60.00)
Glucose, Bld: 138 mg/dL — ABNORMAL HIGH (ref 70–99)
Potassium: 3.7 mEq/L (ref 3.5–5.1)
Sodium: 133 mEq/L — ABNORMAL LOW (ref 135–145)

## 2016-03-24 ENCOUNTER — Telehealth: Payer: Self-pay | Admitting: *Deleted

## 2016-03-24 DIAGNOSIS — R112 Nausea with vomiting, unspecified: Secondary | ICD-10-CM

## 2016-03-24 MED ORDER — ONDANSETRON 4 MG PO TBDP: 4.0000 mg | ORAL_TABLET | Freq: Three times a day (TID) | ORAL | Status: DC | PRN

## 2016-03-24 NOTE — Telephone Encounter (Signed)
Received call from [facility] wanting to verify rx for Zofran Odt that was sent in.  Does pt need 90 & needing dx. Inform pharmacist per chanrt was sent by misakes she have went to local pharmacy. [facility] cancel script sent updated script to local pharmacy.//LMB

## 2016-08-22 ENCOUNTER — Other Ambulatory Visit: Payer: Self-pay | Admitting: Internal Medicine

## 2016-11-30 ENCOUNTER — Encounter: Payer: Medicare Other | Admitting: Internal Medicine

## 2016-12-13 ENCOUNTER — Encounter: Payer: Self-pay | Admitting: Internal Medicine

## 2016-12-13 ENCOUNTER — Ambulatory Visit (INDEPENDENT_AMBULATORY_CARE_PROVIDER_SITE_OTHER): Payer: Medicare Other | Admitting: Internal Medicine

## 2016-12-13 VITALS — BP 160/90 | HR 72 | Temp 98.3°F | Resp 14 | Ht 70.0 in | Wt 196.5 lb

## 2016-12-13 DIAGNOSIS — Z0000 Encounter for general adult medical examination without abnormal findings: Secondary | ICD-10-CM

## 2016-12-13 DIAGNOSIS — E785 Hyperlipidemia, unspecified: Secondary | ICD-10-CM | POA: Diagnosis not present

## 2016-12-13 DIAGNOSIS — I10 Essential (primary) hypertension: Secondary | ICD-10-CM | POA: Diagnosis not present

## 2016-12-13 DIAGNOSIS — M1A9XX0 Chronic gout, unspecified, without tophus (tophi): Secondary | ICD-10-CM | POA: Diagnosis not present

## 2016-12-13 MED ORDER — LISINOPRIL 20 MG PO TABS: 20.0000 mg | ORAL_TABLET | Freq: Every day | ORAL | 3 refills | Status: DC

## 2016-12-13 MED ORDER — LOVASTATIN 20 MG PO TABS: 20.0000 mg | ORAL_TABLET | Freq: Every day | ORAL | 3 refills | Status: DC

## 2016-12-13 NOTE — Progress Notes (Signed)
   Subjective:    Patient ID: Troy Jenkins, male    DOB: 1941-08-12, 76 y.o.   MRN: [id number]  HPI Here for medicare wellness and physical, no new complaints. Please see A/P for status and treatment of chronic medical problems.   Diet: heart healthy Physical activity: sedentary Depression/mood screen: negative Hearing: intact to whispered voice Visual acuity: grossly normal with lens, performs annual eye exam  ADLs: capable Fall risk: none Home safety: good Cognitive evaluation: intact to orientation, naming, recall and repetition EOL planning: adv directives discussed  I have personally reviewed and have noted 1. The patient's medical and social history - reviewed today no changes 2. Their use of alcohol, tobacco or illicit drugs 3. Their current medications and supplements 4. The patient's functional ability including ADL's, fall risks, home safety risks and hearing or visual impairment. 5. Diet and physical activities 6. Evidence for depression or mood disorders 7. Care team reviewed and updated (available in snapshot)  Review of Systems  Constitutional: Negative.   HENT: Negative.   Eyes: Negative.   Respiratory: Negative for cough, chest tightness and shortness of breath.   Cardiovascular: Negative for chest pain, palpitations and leg swelling.  Gastrointestinal: Negative for abdominal distention, abdominal pain, constipation, diarrhea, nausea and vomiting.  Musculoskeletal: Negative.   Skin: Negative.   Neurological: Negative.   Psychiatric/Behavioral: Negative.       Objective:   Physical Exam  Constitutional: He is oriented to person, place, and time. He appears well-developed and well-nourished.  HENT:  Head: Normocephalic and atraumatic.  Eyes: EOM are normal.  Neck: Normal range of motion.  Cardiovascular: Normal rate and regular rhythm.   Pulmonary/Chest: Effort normal and breath sounds normal. No respiratory distress. He has no wheezes. He has no  rales.  Abdominal: Soft. Bowel sounds are normal. He exhibits no distension. There is no tenderness. There is no rebound.  Musculoskeletal: He exhibits no edema.  Neurological: He is alert and oriented to person, place, and time. Coordination normal.  Skin: Skin is warm and dry.  Psychiatric: He has a normal mood and affect.   Vitals:   12/13/16 0958  BP: (!) 160/90  Pulse: 72  Resp: 14  Temp: 98.3 F (36.8 C)  TempSrc: Oral  SpO2: 100%  Weight: 196 lb 8 oz (89.1 kg)  Height: 5\' 10"  (1.778 m)      Assessment & Plan:

## 2016-12-13 NOTE — Patient Instructions (Addendum)
We will increase the lisinopril to 20 mg daily. We have sent in the new strength to the pharmacy. You can take 2 pills a day until you run out of what you have.  I would recommend to recheck the blood levels in a couple of months.   Health Maintenance, Male A healthy lifestyle and preventative care can promote health and wellness.  Maintain regular health, dental, and eye exams.  Eat a healthy diet. Foods like vegetables, fruits, whole grains, low-fat dairy products, and lean protein foods contain the nutrients you need and are low in calories. Decrease your intake of foods high in solid fats, added sugars, and salt. Get information about a proper diet from your health care provider, if necessary.  Regular physical exercise is one of the most important things you can do for your health. Most adults should get at least 150 minutes of moderate-intensity exercise (any activity that increases your heart rate and causes you to sweat) each week. In addition, most adults need muscle-strengthening exercises on 2 or more days a week.   Maintain a healthy weight. The body mass index (BMI) is a screening tool to identify possible weight problems. It provides an estimate of body fat based on height and weight. Your health care provider can find your BMI and can help you achieve or maintain a healthy weight. For males 20 years and older:  A BMI below 18.5 is considered underweight.  A BMI of 18.5 to 24.9 is normal.  A BMI of 25 to 29.9 is considered overweight.  A BMI of 30 and above is considered obese.  Maintain normal blood lipids and cholesterol by exercising and minimizing your intake of saturated fat. Eat a balanced diet with plenty of fruits and vegetables. Blood tests for lipids and cholesterol should begin at age 73 and be repeated every 5 years. If your lipid or cholesterol levels are high, you are over age 41, or you are at high risk for heart disease, you may need your cholesterol levels  checked more frequently.Ongoing high lipid and cholesterol levels should be treated with medicines if diet and exercise are not working.  If you smoke, find out from your health care provider how to quit. If you do not use tobacco, do not start.  Lung cancer screening is recommended for adults aged 65-80 years who are at high risk for developing lung cancer because of a history of smoking. A yearly low-dose CT scan of the lungs is recommended for people who have at least a 30-pack-year history of smoking and are current smokers or have quit within the past 15 years. A pack year of smoking is smoking an average of 1 pack of cigarettes a day for 1 year (for example, a 30-pack-year history of smoking could mean smoking 1 pack a day for 30 years or 2 packs a day for 15 years). Yearly screening should continue until the smoker has stopped smoking for at least 15 years. Yearly screening should be stopped for people who develop a health problem that would prevent them from having lung cancer treatment.  If you choose to drink alcohol, do not have more than 2 drinks per day. One drink is considered to be 12 oz (360 mL) of beer, 5 oz (150 mL) of wine, or 1.5 oz (45 mL) of liquor.  Avoid the use of street drugs. Do not share needles with anyone. Ask for help if you need support or instructions about stopping the use of drugs.  High  blood pressure causes heart disease and increases the risk of stroke. High blood pressure is more likely to develop in:  People who have blood pressure in the end of the normal range (100-139/85-89 mm Hg).  People who are overweight or obese.  People who are African American.  If you are 74-62 years of age, have your blood pressure checked every 3-5 years. If you are 47 years of age or older, have your blood pressure checked every year. You should have your blood pressure measured twice-once when you are at a hospital or clinic, and once when you are not at a hospital or clinic.  Record the average of the two measurements. To check your blood pressure when you are not at a hospital or clinic, you can use:  An automated blood pressure machine at a pharmacy.  A home blood pressure monitor.  If you are 63-98 years old, ask your health care provider if you should take aspirin to prevent heart disease.  Diabetes screening involves taking a blood sample to check your fasting blood sugar level. This should be done once every 3 years after age 31 if you are at a normal weight and without risk factors for diabetes. Testing should be considered at a younger age or be carried out more frequently if you are overweight and have at least 1 risk factor for diabetes.  Colorectal cancer can be detected and often prevented. Most routine colorectal cancer screening begins at the age of 16 and continues through age 74. However, your health care provider may recommend screening at an earlier age if you have risk factors for colon cancer. On a yearly basis, your health care provider may provide home test kits to check for hidden blood in the stool. A small camera at the end of a tube may be used to directly examine the colon (sigmoidoscopy or colonoscopy) to detect the earliest forms of colorectal cancer. Talk to your health care provider about this at age 94 when routine screening begins. A direct exam of the colon should be repeated every 5-10 years through age 15, unless early forms of precancerous polyps or small growths are found.  People who are at an increased risk for hepatitis B should be screened for this virus. You are considered at high risk for hepatitis B if:  You were born in a country where hepatitis B occurs often. Talk with your health care provider about which countries are considered high risk.  Your parents were born in a high-risk country and you have not received a shot to protect against hepatitis B (hepatitis B vaccine).  You have HIV or AIDS.  You use needles to  inject street drugs.  You live with, or have sex with, someone who has hepatitis B.  You are a man who has sex with other men (MSM).  You get hemodialysis treatment.  You take certain medicines for conditions like cancer, organ transplantation, and autoimmune conditions.  Hepatitis C blood testing is recommended for all people born from [birth year] through 1965 and any individual with known risk factors for hepatitis C.  Healthy men should no longer receive prostate-specific antigen (PSA) blood tests as part of routine cancer screening. Talk to your health care provider about prostate cancer screening.  Testicular cancer screening is not recommended for adolescents or adult males who have no symptoms. Screening includes self-exam, a health care provider exam, and other screening tests. Consult with your health care provider about any symptoms you have or any concerns  you have about testicular cancer.  Practice safe sex. Use condoms and avoid high-risk sexual practices to reduce the spread of sexually transmitted infections (STIs).  You should be screened for STIs, including gonorrhea and chlamydia if:  You are sexually active and are younger than 24 years.  You are older than 24 years, and your health care provider tells you that you are at risk for this type of infection.  Your sexual activity has changed since you were last screened, and you are at an increased risk for chlamydia or gonorrhea. Ask your health care provider if you are at risk.  If you are at risk of being infected with HIV, it is recommended that you take a prescription medicine daily to prevent HIV infection. This is called pre-exposure prophylaxis (PrEP). You are considered at risk if:  You are a man who has sex with other men (MSM).  You are a heterosexual man who is sexually active with multiple partners.  You take drugs by injection.  You are sexually active with a partner who has HIV.  Talk with your health care  provider about whether you are at high risk of being infected with HIV. If you choose to begin PrEP, you should first be tested for HIV. You should then be tested every 3 months for as long as you are taking PrEP.  Use sunscreen. Apply sunscreen liberally and repeatedly throughout the day. You should seek shade when your shadow is shorter than you. Protect yourself by wearing long sleeves, pants, a wide-brimmed hat, and sunglasses year round whenever you are outdoors.  Tell your health care provider of new moles or changes in moles, especially if there is a change in shape or color. Also, tell your health care provider if a mole is larger than the size of a pencil eraser.  A one-time screening for abdominal aortic aneurysm (AAA) and surgical repair of large AAAs by ultrasound is recommended for men aged 37-75 years who are current or former smokers.  Stay current with your vaccines (immunizations). This information is not intended to replace advice given to you by your health care provider. Make sure you discuss any questions you have with your health care provider. Document Released: 04/27/2008 Document Revised: 11/20/2014 Document Reviewed: 08/03/2015 Elsevier Interactive Patient Education  2017 [employer].

## 2016-12-13 NOTE — Progress Notes (Signed)
Pre visit review using our clinic review tool, if applicable. No additional management support is needed unless otherwise documented below in the visit note. 

## 2016-12-15 NOTE — Assessment & Plan Note (Signed)
Flu shot done already, shingles up to date and pneumonia series completed. Tetanus is up to date. Counseled about sun safety and mole surveillance as well as the dangers of distracted driving. Given 10 year screening recommendations.

## 2016-12-15 NOTE — Assessment & Plan Note (Signed)
Does not take controller medicine and uses indomethacin prn for flares.

## 2016-12-15 NOTE — Assessment & Plan Note (Signed)
Reviewed lipid panel from [place] on lovastatin 20 mg daily, no indication for adjustment today.

## 2016-12-15 NOTE — Assessment & Plan Note (Signed)
BP slightly above goal today without taking meds this morning. Increase lisinopril to 20 mg daily from 10 mg daily for home readings 140s-150/90s at home. Recent CMP from the [place] reviewed and will be scanned into the system.

## 2017-01-24 ENCOUNTER — Other Ambulatory Visit (INDEPENDENT_AMBULATORY_CARE_PROVIDER_SITE_OTHER): Payer: Medicare Other

## 2017-01-24 ENCOUNTER — Encounter: Payer: Self-pay | Admitting: Internal Medicine

## 2017-01-24 ENCOUNTER — Telehealth: Payer: Self-pay

## 2017-01-24 DIAGNOSIS — I10 Essential (primary) hypertension: Secondary | ICD-10-CM | POA: Diagnosis not present

## 2017-01-24 LAB — BASIC METABOLIC PANEL
BUN: 18 mg/dL (ref 6–23)
CALCIUM: 9.7 mg/dL (ref 8.4–10.5)
CO2: 26 mEq/L (ref 19–32)
CREATININE: 1.06 mg/dL (ref 0.40–1.50)
Chloride: 106 mEq/L (ref 96–112)
GFR: 72.28 mL/min (ref >60.00)
Glucose, Bld: 98 mg/dL (ref 70–99)
Potassium: 4.3 mEq/L (ref 3.5–5.1)
Sodium: 139 mEq/L (ref 135–145)

## 2017-01-24 NOTE — Telephone Encounter (Signed)
Patient is in at the lab, states dr crawford asked him to come back for lab work, don't worry about fasting---routing to dr Sharlet Salina, please advise, I will call lab and let patient know, thanks

## 2017-01-24 NOTE — Telephone Encounter (Signed)
Advised Troy Jenkins/lab that order is entered, she will let Troy Jenkins know

## 2017-01-24 NOTE — Telephone Encounter (Signed)
Ordered BMP for BP med adjustment.

## 2017-07-12 ENCOUNTER — Other Ambulatory Visit (INDEPENDENT_AMBULATORY_CARE_PROVIDER_SITE_OTHER): Payer: Medicare Other

## 2017-07-12 ENCOUNTER — Encounter: Payer: Self-pay | Admitting: Nurse Practitioner

## 2017-07-12 ENCOUNTER — Ambulatory Visit (INDEPENDENT_AMBULATORY_CARE_PROVIDER_SITE_OTHER): Payer: Medicare Other | Admitting: Nurse Practitioner

## 2017-07-12 VITALS — BP 150/84 | HR 68 | Temp 97.9°F | Ht 70.0 in | Wt 192.0 lb

## 2017-07-12 DIAGNOSIS — Z8546 Personal history of malignant neoplasm of prostate: Secondary | ICD-10-CM | POA: Diagnosis not present

## 2017-07-12 DIAGNOSIS — R5381 Other malaise: Secondary | ICD-10-CM

## 2017-07-12 DIAGNOSIS — R42 Dizziness and giddiness: Secondary | ICD-10-CM

## 2017-07-12 DIAGNOSIS — Z87828 Personal history of other (healed) physical injury and trauma: Secondary | ICD-10-CM

## 2017-07-12 DIAGNOSIS — I10 Essential (primary) hypertension: Secondary | ICD-10-CM | POA: Diagnosis not present

## 2017-07-12 DIAGNOSIS — R5383 Other fatigue: Secondary | ICD-10-CM | POA: Diagnosis not present

## 2017-07-12 DIAGNOSIS — R739 Hyperglycemia, unspecified: Secondary | ICD-10-CM

## 2017-07-12 LAB — PSA: PSA: 0 ng/mL — ABNORMAL LOW (ref 0.10–4.00)

## 2017-07-12 LAB — BASIC METABOLIC PANEL
BUN: 23 mg/dL (ref 6–23)
CHLORIDE: 101 meq/L (ref 96–112)
CO2: 31 meq/L (ref 19–32)
Calcium: 9.8 mg/dL (ref 8.4–10.5)
Creatinine, Ser: 1.13 mg/dL (ref 0.40–1.50)
GFR: 67.06 mL/min (ref >60.00)
GLUCOSE: 110 mg/dL — AB (ref 70–99)
Potassium: 4.1 mEq/L (ref 3.5–5.1)
SODIUM: 140 meq/L (ref 135–145)

## 2017-07-12 LAB — CBC
HCT: 39.3 % (ref 39.0–52.0)
Hemoglobin: 12.2 g/dL — ABNORMAL LOW (ref 13.0–17.0)
MCHC: 31 g/dL (ref 30.0–36.0)
MCV: 73.8 fl — AB (ref 78.0–100.0)
PLATELETS: 307 10*3/uL (ref 150.0–400.0)
RBC: 5.32 Mil/uL (ref 4.22–5.81)
RDW: 18.6 % — ABNORMAL HIGH (ref 11.5–15.5)
WBC: 8.5 10*3/uL (ref 4.0–10.5)

## 2017-07-12 LAB — CK: Total CK: 46 U/L (ref 7–232)

## 2017-07-12 LAB — SEDIMENTATION RATE: SED RATE: 11 mm/h (ref 0–20)

## 2017-07-12 LAB — HEMOGLOBIN A1C: HEMOGLOBIN A1C: 6.5 % (ref 4.6–6.5)

## 2017-07-12 NOTE — Progress Notes (Signed)
Subjective:  Patient ID: Troy Jenkins, male    DOB: [birth year]  Age: 76 y.o. MRN: [id number]  CC: Medication Problem (feeling washed out, balance off x1 month)   HPI Fatigue: Onset of symptoms 4-6weeks ago. Associated with SOB with exertion, and intermittent lightheadedness Treated for allergic reaction for multiple bees tings with oral prednisone. Denies any worsening or improvement of symptoms while taking prednisone. He was concerned about BP so recording home BP of 140s/70s to 170/80s. HR 60-70. Had multiple tick bite over 13month ago and is concerned his symptoms may also be related. Denies any rash. He works on his yard constantly but makes sure he has adequate oral hydration  Outpatient Medications Prior to Visit  Medication Sig Dispense Refill  . aspirin 81 MG tablet Take 81 mg by mouth daily.      .Marland Kitchenglucosamine-chondroitin 500-400 MG tablet Take 1 tablet by mouth 2 (two) times daily with a meal. 180 tablet 3  . indomethacin (INDOCIN) 25 MG capsule Take 1 capsule (25 mg total) by mouth every 8 (eight) hours as needed. 90 capsule 0  . lisinopril (PRINIVIL,ZESTRIL) 20 MG tablet Take 1 tablet (20 mg total) by mouth daily. 90 tablet 3  . lovastatin (MEVACOR) 20 MG tablet Take 1 tablet (20 mg total) by mouth at bedtime. 90 tablet 3  . Multiple Vitamin (MULTIVITAMIN) tablet Take 1 tablet by mouth daily.      . ondansetron (ZOFRAN ODT) 4 MG disintegrating tablet Take 1 tablet (4 mg total) by mouth every 8 (eight) hours as needed for nausea or vomiting. 20 tablet 0   No facility-administered medications prior to visit.     ROS Review of Systems  Constitutional: Positive for malaise/fatigue. Negative for chills, fever and weight loss.  HENT: Negative for congestion, sore throat and tinnitus.   Respiratory: Positive for shortness of breath. Negative for cough.   Cardiovascular: Negative for chest pain, palpitations, orthopnea and leg swelling.  Gastrointestinal: Negative.     Genitourinary: Negative.   Musculoskeletal: Negative.   Skin: Negative.   Neurological: Negative.  Negative for weakness.  Psychiatric/Behavioral: Negative.      Objective:  BP (!) 150/84 (BP Location: Left Arm, Patient Position: Sitting, Cuff Size: Large)   Pulse 68   Temp 97.9 F (36.6 C) (Oral)   Ht '5\' 10"'  (1.778 m)   Wt 192 lb (87.1 kg)   SpO2 100%   BMI 27.55 kg/m   BP Readings from Last 3 Encounters:  07/12/17 (!) 150/84  12/13/16 (!) 160/90  03/22/16 106/66    Wt Readings from Last 3 Encounters:  07/12/17 192 lb (87.1 kg)  12/13/16 196 lb 8 oz (89.1 kg)  03/22/16 193 lb (87.5 kg)    Physical Exam  Constitutional: He is oriented to person, place, and time. No distress.  Cardiovascular: Normal rate, regular rhythm and normal heart sounds.   Pulmonary/Chest: Effort normal and breath sounds normal.  Abdominal: Soft. Bowel sounds are normal. There is no tenderness.  Musculoskeletal: He exhibits no edema.  Neurological: He is alert and oriented to person, place, and time.  Skin: Skin is warm and dry. No rash noted. No erythema.  Psychiatric: He has a normal mood and affect. His behavior is normal.  Vitals reviewed.   Lab Results  Component Value Date   WBC 8.5 07/12/2017   HGB 12.2 (L) 07/12/2017   HCT 39.3 07/12/2017   PLT 307.0 07/12/2017   GLUCOSE 110 (H) 07/12/2017   CHOL 178 11/29/2015  TRIG 176.0 (H) 11/29/2015   HDL 50.70 11/29/2015   LDLDIRECT 99.3 07/23/2008   LDLCALC 92 11/29/2015   ALT 18 11/29/2015   AST 24 11/29/2015   NA 140 07/12/2017   K 4.1 07/12/2017   CL 101 07/12/2017   CREATININE 1.13 07/12/2017   BUN 23 07/12/2017   CO2 31 07/12/2017   TSH 1.45 11/26/2013   PSA 0.00 (L) 07/12/2017   HGBA1C 6.5 07/12/2017    Dg Abd 2 Views  Result Date: 03/23/2016 CLINICAL DATA:  Four days of constipation ; patient had a small bowel movement today; history of nausea and vomiting, previous hernia repair. EXAM: ABDOMEN - 2 VIEW COMPARISON:   Abdominal and pelvic CT scan of October 05, 2010 FINDINGS: The stool burden is moderate in the right and transverse colon. There is no small or large bowel obstructive pattern. There is no evidence of a fecal impaction. No free extraluminal gas collections are observed. There are mild degenerative changes of both hips and of the lower lumbar spine. IMPRESSION: Mildly increased stool burden in the ascending and transverse colon. No evidence of obstruction or ileus. Electronically Signed   By: David  Martinique M.D.   On: 03/23/2016 10:56    Assessment & Plan:   Shanti was seen today for medication problem.  Diagnoses and all orders for this visit:  Malaise and fatigue -     Lyme Ab/Western Blot Reflex; Future -     CBC; Future -     CK; Future -     Sed Rate (ESR); Future -     Rocky mtn spotted fvr abs pnl(IgG+IgM); Future -     Hemoglobin A1c; Future  H/O prostate cancer -     PSA; Future  Essential hypertension -     Basic metabolic panel; Future  Tick bite with tick attached for 8 hours or longer -     Lyme Ab/Western Blot Reflex; Future -     Rocky mtn spotted fvr abs pnl(IgG+IgM); Future  Hyperglycemia -     Hemoglobin A1c; Future  Lightheadedness -     Lyme Ab/Western Blot Reflex; Future -     CBC; Future -     Rocky mtn spotted fvr abs pnl(IgG+IgM); Future  Other orders -     Discontinue: doxycycline (VIBRA-TABS) 100 MG tablet; Take 1 tablet (100 mg total) by mouth 2 (two) times daily. -     Cancel: Ambulatory referral to Infectious Disease   I am having Mr. Jesson maintain his aspirin, multivitamin, glucosamine-chondroitin, indomethacin, ondansetron, lovastatin, and lisinopril.  Meds ordered this encounter  Medications  . DISCONTD: doxycycline (VIBRA-TABS) 100 MG tablet    Sig: Take 1 tablet (100 mg total) by mouth 2 (two) times daily.    Dispense:  28 tablet    Refill:  0    Order Specific Question:   Supervising Provider    Answer:   Cassandria Anger  [[id number]]    Follow-up: Return in about 1 month (around 08/12/2017) for with Dr. Sharlet Salina.  Wilfred Lacy, NP

## 2017-07-12 NOTE — Patient Instructions (Addendum)
Positive RMSFD IgG and negative IgM No need for oral abx at this time. Negative for lyme disease

## 2017-07-13 ENCOUNTER — Encounter: Payer: Self-pay | Admitting: Nurse Practitioner

## 2017-07-13 DIAGNOSIS — A770 Spotted fever due to Rickettsia rickettsii: Secondary | ICD-10-CM

## 2017-07-13 LAB — REFLEX RMSF IGG TITER: RMSF IgG Titer: 1:64 {titer} — ABNORMAL HIGH

## 2017-07-13 LAB — ROCKY MTN SPOTTED FVR ABS PNL(IGG+IGM)
RMSF IgG: DETECTED — AB
RMSF IgM: NOT DETECTED

## 2017-07-13 LAB — LYME AB/WESTERN BLOT REFLEX: B burgdorferi Ab IgG+IgM: <0.9 Index (ref <0.90)

## 2017-07-13 MED ORDER — DOXYCYCLINE HYCLATE 100 MG PO TABS: 100.0000 mg | ORAL_TABLET | Freq: Two times a day (BID) | ORAL | 0 refills | Status: DC

## 2017-07-14 MED ORDER — DOXYCYCLINE HYCLATE 100 MG PO TABS: 100.0000 mg | ORAL_TABLET | Freq: Two times a day (BID) | ORAL | 0 refills | Status: DC

## 2017-07-17 ENCOUNTER — Encounter: Payer: Self-pay | Admitting: Nurse Practitioner

## 2017-07-17 ENCOUNTER — Telehealth: Payer: Self-pay | Admitting: Nurse Practitioner

## 2017-07-17 ENCOUNTER — Other Ambulatory Visit: Payer: Self-pay | Admitting: Nurse Practitioner

## 2017-07-17 DIAGNOSIS — R5381 Other malaise: Secondary | ICD-10-CM

## 2017-07-17 DIAGNOSIS — R5383 Other fatigue: Principal | ICD-10-CM

## 2017-07-17 DIAGNOSIS — R0602 Shortness of breath: Secondary | ICD-10-CM

## 2017-07-17 NOTE — Telephone Encounter (Signed)
Pt verbalized understand.  

## 2017-07-17 NOTE — Telephone Encounter (Signed)
Spoke with pt and gave massage below and cancel abx sent to [facility]. Pt report weakness,SOB, feel "wash out". He is very concern and wants to see what else can we do or recommendation from [place]. Please advise.   Hello Mr. Fatheree, After review of [facility] Spotted fever titer, you are positive for previous infection and not a current infection. This means, you had the infection in past and developed antibodies, but the infection is not active a this time. Therefore you do not need doxycycline at this time. We will call [facility] and cancel your prescription. You can follow up with Dr. Sharlet Salina in 76month instead of 1week.  Please let me know if you have any additional questions

## 2017-07-17 NOTE — Telephone Encounter (Signed)
Pt called and I gave him your response, he has a few questions and would like a call back.  Call him on his home phone, he will be home for an hour, and we will be back home after 2.

## 2017-07-18 ENCOUNTER — Ambulatory Visit (INDEPENDENT_AMBULATORY_CARE_PROVIDER_SITE_OTHER)
Admission: RE | Admit: 2017-07-18 | Discharge: 2017-07-18 | Disposition: A | Discharge status: Home / Self Care | Payer: Medicare Other | Source: Ambulatory Visit | Attending: Nurse Practitioner | Admitting: Nurse Practitioner

## 2017-07-18 DIAGNOSIS — R5383 Other fatigue: Secondary | ICD-10-CM | POA: Diagnosis not present

## 2017-07-18 DIAGNOSIS — R5381 Other malaise: Secondary | ICD-10-CM

## 2017-07-18 DIAGNOSIS — R05 Cough: Secondary | ICD-10-CM | POA: Diagnosis not present

## 2017-07-18 DIAGNOSIS — R0602 Shortness of breath: Secondary | ICD-10-CM

## 2017-08-09 ENCOUNTER — Encounter: Payer: Self-pay | Admitting: Internal Medicine

## 2017-08-09 ENCOUNTER — Ambulatory Visit (INDEPENDENT_AMBULATORY_CARE_PROVIDER_SITE_OTHER): Payer: Medicare Other | Admitting: Internal Medicine

## 2017-08-09 VITALS — BP 142/90 | HR 62 | Ht 70.0 in | Wt 193.0 lb

## 2017-08-09 DIAGNOSIS — R5383 Other fatigue: Secondary | ICD-10-CM

## 2017-08-09 DIAGNOSIS — I10 Essential (primary) hypertension: Secondary | ICD-10-CM

## 2017-08-09 DIAGNOSIS — R0602 Shortness of breath: Secondary | ICD-10-CM

## 2017-08-09 DIAGNOSIS — E785 Hyperlipidemia, unspecified: Secondary | ICD-10-CM | POA: Diagnosis not present

## 2017-08-09 NOTE — Patient Instructions (Addendum)
Your physician has requested that you have en exercise stress myoview. For further information please visit [email or web address]. Please follow instruction sheet, as given.  Your physician recommends that you schedule a follow-up appointment with Dr. Debara Pickett after your stress test

## 2017-08-10 DIAGNOSIS — E785 Hyperlipidemia, unspecified: Secondary | ICD-10-CM | POA: Insufficient documentation

## 2017-08-10 DIAGNOSIS — R5383 Other fatigue: Secondary | ICD-10-CM | POA: Insufficient documentation

## 2017-08-10 DIAGNOSIS — R0602 Shortness of breath: Secondary | ICD-10-CM | POA: Insufficient documentation

## 2017-08-10 NOTE — Progress Notes (Signed)
OFFICE CONSULT NOTE  Chief Complaint:  Shortness of breath and fatigue  Primary Care Physician: Troy Koch, MD  HPI:  Troy Jenkins is a 76 y.o. male who is being seen today for the evaluation of the above complaint at the request of Nche, Charlene Brooke, NP. Troy Jenkins presents for evaluation of shortness of breath and fatigue. Past medical history significant for hypertension, dyslipidemia, and prostate cancer. He was seen recently by his primary care provider who noted the onset of fatigue 4-6 weeks prior with shortness of breath and intermittent lightheadedness. He apparently had had a tick bite and thought it might be related to that. He underwent workup for Lyme disease and Troy Jenkins spotted fever. There is no evidence of fever however he tested positive for the IgG. This suggest prior exposure but no active infection. He was initially to be treated with doxycycline but apparently that was discontinued. There is discussion about a possible infectious disease consultation but he does not have an appointment. He says his fatigue is persisted since about that time but is slowly gotten a little better. It short of breath with minimal exertion. He denies any chest pain.  PMHx:  Past Medical History:  Diagnosis Date  . Cancer [facility])    prostate  . GOUT 08/13/2007  . HYPERLIPIDEMIA 08/13/2007  . HYPERTENSION 08/13/2007  . Ligament tear 06/22/12   left wrist   . Skin cancer of arm 10/13   left arm; small skin cancer removed; low grade  . 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 COLONOSCOPY  07/2010  . HERNIA REPAIR  2010   Left   . PROSTATE SURGERY  12/2005  . TREATMENT FISTULA ANAL  1980    FAMHx:  Family History  Problem Relation Age of Onset  . Leukemia Mother   . Diabetes Father   . Stroke Father   . Cancer Maternal Grandmother   . Stroke Paternal Grandfather   . Colon cancer Neg Hx     SOCHx:   reports  that he quit smoking about 49 years ago. He has never used smokeless tobacco. He reports that he drinks alcohol. He reports that he does not use drugs.  ALLERGIES:  Allergies  Allergen Reactions  . Amoxicillin Itching  . Doxycycline Itching  . Iodine     IV dye    ROS: Pertinent items noted in HPI and remainder of comprehensive ROS otherwise negative.  HOME MEDS: Current Outpatient Prescriptions on File Prior to Visit  Medication Sig Dispense Refill  . aspirin 81 MG tablet Take 81 mg by mouth daily.      Marland Kitchen glucosamine-chondroitin 500-400 MG tablet Take 1 tablet by mouth 2 (two) times daily with a meal. 180 tablet 3  . indomethacin (INDOCIN) 25 MG capsule Take 1 capsule (25 mg total) by mouth every 8 (eight) hours as needed. 90 capsule 0  . lisinopril (PRINIVIL,ZESTRIL) 20 MG tablet Take 1 tablet (20 mg total) by mouth daily. 90 tablet 3  . lovastatin (MEVACOR) 20 MG tablet Take 1 tablet (20 mg total) by mouth at bedtime. 90 tablet 3  . Multiple Vitamin (MULTIVITAMIN) tablet Take 1 tablet by mouth daily.      . ondansetron (ZOFRAN ODT) 4 MG disintegrating tablet Take 1 tablet (4 mg total) by mouth every 8 (eight) hours as needed for nausea or vomiting. 20 tablet 0   No current facility-administered medications on file prior to visit.  LABS/IMAGING: No results found for this or any previous visit (from the past 48 hour(s)). No results found.  LIPID PANEL:    Component Value Date/Time   CHOL 178 11/29/2015 1120   TRIG 176.0 (H) 11/29/2015 1120   HDL 50.70 11/29/2015 1120   CHOLHDL 4 11/29/2015 1120   VLDL 35.2 11/29/2015 1120   LDLCALC 92 11/29/2015 1120   LDLDIRECT 99.3 07/23/2008 1216    WEIGHTS: Wt Readings from Last 3 Encounters:  08/09/17 193 lb (87.5 kg)  07/12/17 192 lb (87.1 kg)  12/13/16 196 lb 8 oz (89.1 kg)    VITALS: BP (!) 142/90   Pulse 62   Ht 5\' 10"  (1.778 m)   Wt 193 lb (87.5 kg)   BMI 27.69 kg/m   EXAM: General appearance: alert and no  distress Neck: no carotid bruit, no JVD and thyroid not enlarged, symmetric, no tenderness/mass/nodules Lungs: clear to auscultation bilaterally Heart: regular rate and rhythm, S1, S2 normal, no murmur, click, rub or gallop Abdomen: soft, non-tender; bowel sounds normal; no masses,  no organomegaly Extremities: extremities normal, atraumatic, no cyanosis or edema Pulses: 2+ and symmetric Skin: Skin color, texture, turgor normal. No rashes or lesions Neurologic: Grossly normal Psych: Pleasant  EKG: Normal sinus rhythm at 62-personally reviewed - personally reviewed  ASSESSMENT: 1. Dyspnea on exertion 2. Fatigue 3. Hypertension 4. Dyslipidemia  PLAN: 1.   Troy Jenkins is describing progressive dyspnea on exertion and fatigue over the past 1-2 months. They've been associated tic bite but no evidence of Lyme disease or other clear etiology of his fatigue. I'm recommending a Myoview stress test to evaluate for possible cardiac etiology. Risk factors include age, hypertension and dyslipidemia. His symptoms are describing progressive fatigue which is exertional and may be an anginal equivalent.  Follow-up with me afterwards. Thanks again for the kind referral.  Troy Casino, MD, [place]  Attending Cardiologist  Direct Dial: [phone]  Fax: [phone]  Website:  [email or web address] 08/10/2017, 4:55 PM

## 2017-08-13 ENCOUNTER — Other Ambulatory Visit: Payer: Self-pay | Admitting: Internal Medicine

## 2017-08-13 DIAGNOSIS — R5383 Other fatigue: Secondary | ICD-10-CM

## 2017-08-13 DIAGNOSIS — E785 Hyperlipidemia, unspecified: Secondary | ICD-10-CM

## 2017-08-13 DIAGNOSIS — I10 Essential (primary) hypertension: Secondary | ICD-10-CM

## 2017-08-13 DIAGNOSIS — R0602 Shortness of breath: Secondary | ICD-10-CM

## 2017-08-21 ENCOUNTER — Telehealth (HOSPITAL_COMMUNITY): Payer: Self-pay

## 2017-08-21 NOTE — Telephone Encounter (Signed)
Encounter complete. 

## 2017-08-22 ENCOUNTER — Telehealth (HOSPITAL_COMMUNITY): Payer: Self-pay

## 2017-08-22 DIAGNOSIS — H52221 Regular astigmatism, right eye: Secondary | ICD-10-CM | POA: Diagnosis not present

## 2017-08-22 DIAGNOSIS — H5203 Hypermetropia, bilateral: Secondary | ICD-10-CM | POA: Diagnosis not present

## 2017-08-22 DIAGNOSIS — H11153 Pinguecula, bilateral: Secondary | ICD-10-CM | POA: Diagnosis not present

## 2017-08-22 DIAGNOSIS — H524 Presbyopia: Secondary | ICD-10-CM | POA: Diagnosis not present

## 2017-08-22 NOTE — Telephone Encounter (Signed)
Encounter complete. 

## 2017-08-23 ENCOUNTER — Ambulatory Visit (HOSPITAL_COMMUNITY)
Admission: RE | Admit: 2017-08-23 | Discharge: 2017-08-23 | Disposition: A | Discharge status: Home / Self Care | Payer: Medicare Other | Source: Ambulatory Visit | Attending: Cardiovascular Disease | Admitting: Cardiovascular Disease

## 2017-08-23 DIAGNOSIS — R5383 Other fatigue: Secondary | ICD-10-CM | POA: Diagnosis not present

## 2017-08-23 DIAGNOSIS — I10 Essential (primary) hypertension: Secondary | ICD-10-CM | POA: Diagnosis not present

## 2017-08-23 DIAGNOSIS — R9439 Abnormal result of other cardiovascular function study: Secondary | ICD-10-CM | POA: Diagnosis not present

## 2017-08-23 DIAGNOSIS — E785 Hyperlipidemia, unspecified: Secondary | ICD-10-CM | POA: Diagnosis not present

## 2017-08-23 DIAGNOSIS — R0602 Shortness of breath: Secondary | ICD-10-CM | POA: Insufficient documentation

## 2017-08-23 LAB — MYOCARDIAL PERFUSION IMAGING
CHL CUP NUCLEAR SRS: 3
CHL CUP NUCLEAR SSS: 3
CHL CUP RESTING HR STRESS: 74 {beats}/min
CHL CUP STRESS STAGE 1 HR: 88 {beats}/min
CHL CUP STRESS STAGE 2 SPEED: 0 mph
CHL CUP STRESS STAGE 3 DBP: 74 mmHg
CHL CUP STRESS STAGE 3 GRADE: 10 %
CHL CUP STRESS STAGE 3 HR: 141 {beats}/min
CHL CUP STRESS STAGE 3 SBP: 208 mmHg
CHL CUP STRESS STAGE 4 GRADE: 12 %
CHL CUP STRESS STAGE 4 HR: 155 {beats}/min
CHL CUP STRESS STAGE 4 SPEED: 2.5 mph
CHL CUP STRESS STAGE 5 GRADE: 0 %
CHL CUP STRESS STAGE 5 HR: 131 {beats}/min
CHL CUP STRESS STAGE 5 SBP: 210 mmHg
CHL CUP STRESS STAGE 5 SPEED: 0 mph
CHL CUP STRESS STAGE 6 DBP: 88 mmHg
CHL CUP STRESS STAGE 6 GRADE: 0 %
CHL CUP STRESS STAGE 6 HR: 99 {beats}/min
CHL CUP STRESS STAGE 6 SBP: 175 mmHg
CHL RATE OF PERCEIVED EXERTION: 17
CSEPEW: 7 METS
CSEPPMHR: 107 %
Exercise duration (min): 5 min
Exercise duration (sec): 30 s
LV sys vol: 44 mL
LVDIAVOL: 101 mL (ref 62–150)
MPHR: 144 {beats}/min
Peak BP: 206 mmHg
Peak HR: 155 {beats}/min
Percent HR: 109 %
SDS: 0
Stage 1 DBP: 97 mmHg
Stage 1 Grade: 0 %
Stage 1 SBP: 159 mmHg
Stage 1 Speed: 0 mph
Stage 2 Grade: 0 %
Stage 2 HR: 88 {beats}/min
Stage 3 Speed: 1.7 mph
Stage 4 DBP: 103 mmHg
Stage 4 SBP: 206 mmHg
Stage 5 DBP: 99 mmHg
Stage 6 Speed: 0 mph
TID: 0.95

## 2017-08-23 MED ORDER — TECHNETIUM TC 99M TETROFOSMIN IV KIT
31.5000 | PACK | Freq: Once | INTRAVENOUS | Status: AC | PRN
  Administered 2017-08-23: 31.5 via INTRAVENOUS
  Filled 2017-08-23: qty 32

## 2017-08-23 MED ORDER — TECHNETIUM TC 99M TETROFOSMIN IV KIT
10.8000 | PACK | Freq: Once | INTRAVENOUS | Status: AC | PRN
  Administered 2017-08-23: 10.8 via INTRAVENOUS
  Filled 2017-08-23: qty 11

## 2017-08-29 ENCOUNTER — Other Ambulatory Visit: Payer: Self-pay | Admitting: Internal Medicine

## 2017-09-18 ENCOUNTER — Encounter: Payer: Self-pay | Admitting: Internal Medicine

## 2017-09-18 ENCOUNTER — Ambulatory Visit: Payer: Medicare Other | Admitting: Internal Medicine

## 2017-09-18 VITALS — BP 136/88 | HR 68 | Ht 70.0 in | Wt 194.0 lb

## 2017-09-18 DIAGNOSIS — I10 Essential (primary) hypertension: Secondary | ICD-10-CM

## 2017-09-18 DIAGNOSIS — E785 Hyperlipidemia, unspecified: Secondary | ICD-10-CM

## 2017-09-18 DIAGNOSIS — R0602 Shortness of breath: Secondary | ICD-10-CM | POA: Diagnosis not present

## 2017-09-18 DIAGNOSIS — R5383 Other fatigue: Secondary | ICD-10-CM | POA: Diagnosis not present

## 2017-09-18 NOTE — Progress Notes (Signed)
OFFICE CONSULT NOTE  Chief Complaint:  Follow-up stress test  Primary Care Physician: Hoyt Koch, MD  HPI:  Troy Jenkins is a 76 y.o. male who is being seen today for the evaluation of the above complaint at the request of Hoyt Koch, *. Troy Jenkins presents for evaluation of shortness of breath and fatigue. Past medical history significant for hypertension, dyslipidemia, and prostate cancer. He was seen recently by his primary care provider who noted the onset of fatigue 4-6 weeks prior with shortness of breath and intermittent lightheadedness. He apparently had had a tick bite and thought it might be related to that. He underwent workup for Lyme disease and [facility] spotted fever. There is no evidence of fever however he tested positive for the IgG. This suggest prior exposure but no active infection. He was initially to be treated with doxycycline but apparently that was discontinued. There is discussion about a possible infectious disease consultation but he does not have an appointment. He says his fatigue is persisted since about that time but is slowly gotten a little better. It short of breath with minimal exertion. He denies any chest pain.  09/18/2017  Troy Jenkins returns today for follow-up of his stress test.  This was an exercise Myoview performed on 08/23/2017.  This demonstrated normal LVEF 57% with a small fixed inferior inferoseptal perfusion defect suggestive of artifact.  Overall the study was low risk.  He reports he has had no further chest pain or worsening shortness of breath.  In fact he did a lot of recent work in the yard and I was asymptomatic with that.  He inquired today however about vascular screening.  His father died of stroke in his 6s and is interested in further risk assessment.  PMHx:  Past Medical History:  Diagnosis Date  . Cancer [facility])    prostate  . GOUT 08/13/2007  . HYPERLIPIDEMIA 08/13/2007  . HYPERTENSION 08/13/2007    . Ligament tear 06/22/12   left wrist   . Skin cancer of arm 10/13   left arm; small skin cancer removed; low grade  . 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 COLONOSCOPY  07/2010  . HERNIA REPAIR  2010   Left   . PROSTATE SURGERY  12/2005  . TREATMENT FISTULA ANAL  1980    FAMHx:  Family History  Problem Relation Age of Onset  . Leukemia Mother   . Diabetes Father   . Stroke Father   . Cancer Maternal Grandmother   . Stroke Paternal Grandfather   . Colon cancer Neg Hx     SOCHx:   reports that he quit smoking about 49 years ago. he has never used smokeless tobacco. He reports that he drinks alcohol. He reports that he does not use drugs.  ALLERGIES:  Allergies  Allergen Reactions  . Amoxicillin Itching  . Doxycycline Itching  . Iodine     IV dye    ROS: Pertinent items noted in HPI and remainder of comprehensive ROS otherwise negative.  HOME MEDS: Current Outpatient Medications on File Prior to Visit  Medication Sig Dispense Refill  . aspirin 81 MG tablet Take 81 mg by mouth daily.      Marland Kitchen glucosamine-chondroitin 500-400 MG tablet Take 1 tablet by mouth 2 (two) times daily with a meal. 180 tablet 3  . indomethacin (INDOCIN) 25 MG capsule Take 1 capsule (25 mg total) by mouth every 8 (eight)  hours as needed. 90 capsule 0  . lisinopril (PRINIVIL,ZESTRIL) 20 MG tablet Take 1 tablet (20 mg total) by mouth daily. 90 tablet 3  . lovastatin (MEVACOR) 20 MG tablet Take 1 tablet (20 mg total) by mouth at bedtime. 90 tablet 3  . Multiple Vitamin (MULTIVITAMIN) tablet Take 1 tablet by mouth daily.      . ondansetron (ZOFRAN ODT) 4 MG disintegrating tablet Take 1 tablet (4 mg total) by mouth every 8 (eight) hours as needed for nausea or vomiting. 20 tablet 0   No current facility-administered medications on file prior to visit.     LABS/IMAGING: No results found for this or any previous visit (from the past 48 hour(s)). No  results found.  LIPID PANEL:    Component Value Date/Time   CHOL 178 11/29/2015 1120   TRIG 176.0 (H) 11/29/2015 1120   HDL 50.70 11/29/2015 1120   CHOLHDL 4 11/29/2015 1120   VLDL 35.2 11/29/2015 1120   LDLCALC 92 11/29/2015 1120   LDLDIRECT 99.3 07/23/2008 1216    WEIGHTS: Wt Readings from Last 3 Encounters:  09/18/17 194 lb (88 kg)  08/23/17 193 lb (87.5 kg)  08/09/17 193 lb (87.5 kg)    VITALS: BP 136/88   Pulse 68   Ht 5\' 10"  (1.778 m)   Wt 194 lb (88 kg)   BMI 27.84 kg/m   EXAM: Deferred  EKG: Deferred  ASSESSMENT: 1. Dyspnea on exertion -low risk exercise Myoview with normal LV function (08/2017) 2. Fatigue 3. Hypertension 4. Dyslipidemia  PLAN: 1.   Troy Jenkins says his shortness of breath and fatigue have improved.  His stress test was negative.  He does have multiple cardiac risk factors including age, dyslipidemia and hypertension and a family history of stroke in his father.  He is interested in elective vascular screening, including carotid Dopplers, abdominal ultrasound and peripheral arterial Dopplers.  I provided information on this today would be happy to review those in follow-up.  Follow-up with me can be as needed or if there are abnormalities I am happy to see him back annually.  Pixie Casino, MD, [place]  Attending Cardiologist  Direct Dial: [phone]  Fax: [phone]  Website:  [email or web address] 09/18/2017, 10:17 AM

## 2017-09-18 NOTE — Patient Instructions (Signed)
Dr Debara Pickett recommends that you follow-up with him as needed.  If you would like to schedule a vascuscreen study, please contact [phone].

## 2017-10-01 ENCOUNTER — Other Ambulatory Visit: Payer: Self-pay | Admitting: Internal Medicine

## 2017-10-01 DIAGNOSIS — I10 Essential (primary) hypertension: Secondary | ICD-10-CM

## 2017-10-22 ENCOUNTER — Encounter (HOSPITAL_COMMUNITY): Payer: Medicare Other

## 2017-10-23 ENCOUNTER — Inpatient Hospital Stay (HOSPITAL_COMMUNITY): Admission: RE | Admit: 2017-10-23 | Payer: Medicare Other | Source: Ambulatory Visit

## 2017-10-29 ENCOUNTER — Ambulatory Visit (HOSPITAL_COMMUNITY)
Admission: RE | Admit: 2017-10-29 | Discharge: 2017-10-29 | Disposition: A | Discharge status: Home / Self Care | Payer: Medicare Other | Source: Ambulatory Visit | Attending: Internal Medicine | Admitting: Internal Medicine

## 2017-10-29 DIAGNOSIS — I10 Essential (primary) hypertension: Secondary | ICD-10-CM

## 2017-10-30 ENCOUNTER — Other Ambulatory Visit: Payer: Self-pay | Admitting: *Deleted

## 2017-10-30 DIAGNOSIS — I6521 Occlusion and stenosis of right carotid artery: Secondary | ICD-10-CM

## 2017-11-27 IMAGING — NM NM MISC PROCEDURE
9 series · 54 of 54 positions shown · non-contrast
Comparison: none

[Series 1: rest sax · 6.4mm · 6.40mm/px · 6 of 22 frames shown]
[frame 2/22]
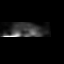
[frame 6/22]
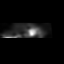
[frame 10/22]
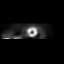
[frame 13/22]
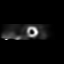
[frame 17/22]
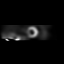
[frame 21/22]
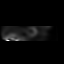

[Series 1: wbr_r-proj_st wbr rest · 6.40mm/px · 6 of 64 frames shown]
[frame 6/64]
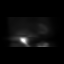
[frame 16/64]
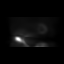
[frame 27/64]
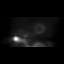
[frame 38/64]
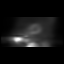
[frame 48/64]
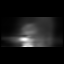
[frame 59/64]
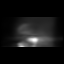

[Series 1: wbr rest · 6.40mm/px · 6 of 64 frames shown]
[frame 6/64]
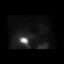
[frame 16/64]
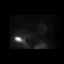
[frame 27/64]
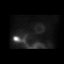
[frame 38/64]
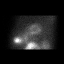
[frame 48/64]
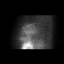
[frame 59/64]
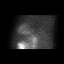

[Series 2: wbr_s-proj_st wbr stress-gsp · 6.40mm/px · 6 of 512 frames shown]
[frame 43/512]
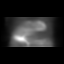
[frame 128/512]
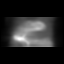
[frame 214/512]
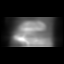
[frame 299/512]
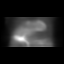
[frame 384/512]
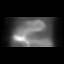
[frame 470/512]
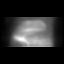

[Series 2: stress sax gs · 6.4mm · 6.40mm/px · 6 of 160 frames shown]
[frame 14/160]
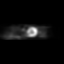
[frame 40/160]
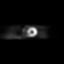
[frame 67/160]
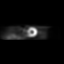
[frame 94/160]
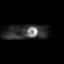
[frame 120/160]
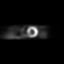
[frame 147/160]
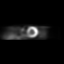

[Series 2: stress sax · 6.4mm · 6.40mm/px · 6 of 20 frames shown]
[frame 2/20]
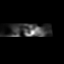
[frame 5/20]
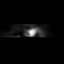
[frame 9/20]
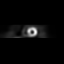
[frame 12/20]
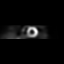
[frame 15/20]
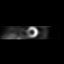
[frame 19/20]
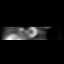

[Series 2: wbr stress-gsp · 6.40mm/px · 6 of 511 frames shown]
[frame 43/511]
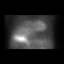
[frame 128/511]
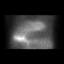
[frame 213/511]
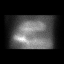
[frame 298/511]
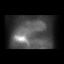
[frame 383/511]
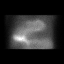
[frame 469/511]
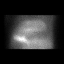

[Series 3: wbr stress-sum-em · 6.40mm/px · 6 of 64 frames shown]
[frame 6/64]
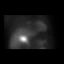
[frame 16/64]
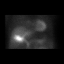
[frame 27/64]
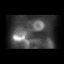
[frame 38/64]
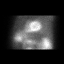
[frame 48/64]
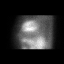
[frame 59/64]
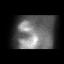

[Series 3: wbr_s-proj_st wbr stress-sum-em · 6.40mm/px · 6 of 64 frames shown]
[frame 6/64]
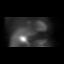
[frame 16/64]
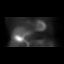
[frame 27/64]
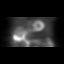
[frame 38/64]
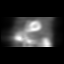
[frame 48/64]
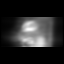
[frame 59/64]
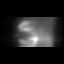

[54 of 54 positions shown; findings below may reference images not displayed]

Canned report from images found in remote index.

Refer to host system for actual result text.

## 2017-12-14 ENCOUNTER — Other Ambulatory Visit (INDEPENDENT_AMBULATORY_CARE_PROVIDER_SITE_OTHER): Payer: Medicare Other

## 2017-12-14 ENCOUNTER — Ambulatory Visit (INDEPENDENT_AMBULATORY_CARE_PROVIDER_SITE_OTHER): Payer: Medicare Other | Admitting: *Deleted

## 2017-12-14 ENCOUNTER — Encounter: Payer: Self-pay | Admitting: Internal Medicine

## 2017-12-14 ENCOUNTER — Ambulatory Visit (INDEPENDENT_AMBULATORY_CARE_PROVIDER_SITE_OTHER): Payer: Medicare Other | Admitting: Internal Medicine

## 2017-12-14 VITALS — BP 138/86 | HR 85 | Temp 97.6°F | Ht 70.0 in | Wt 194.0 lb

## 2017-12-14 DIAGNOSIS — I10 Essential (primary) hypertension: Secondary | ICD-10-CM | POA: Diagnosis not present

## 2017-12-14 DIAGNOSIS — Z0000 Encounter for general adult medical examination without abnormal findings: Secondary | ICD-10-CM | POA: Diagnosis not present

## 2017-12-14 DIAGNOSIS — E785 Hyperlipidemia, unspecified: Secondary | ICD-10-CM

## 2017-12-14 DIAGNOSIS — Z8546 Personal history of malignant neoplasm of prostate: Secondary | ICD-10-CM

## 2017-12-14 DIAGNOSIS — M1A9XX0 Chronic gout, unspecified, without tophus (tophi): Secondary | ICD-10-CM

## 2017-12-14 LAB — LIPID PANEL
CHOL/HDL RATIO: 3
Cholesterol: 173 mg/dL (ref 0–200)
HDL: 65.7 mg/dL (ref >39.00)
LDL CALC: 75 mg/dL (ref 0–99)
NONHDL: 107.75
TRIGLYCERIDES: 162 mg/dL — AB (ref 0.0–149.0)
VLDL: 32.4 mg/dL (ref 0.0–40.0)

## 2017-12-14 LAB — PSA: PSA: 0 ng/mL — ABNORMAL LOW (ref 0.10–4.00)

## 2017-12-14 MED ORDER — ZOSTER VAC RECOMB ADJUVANTED 50 MCG/0.5ML IM SUSR: 0.5000 mL | Freq: Once | INTRAMUSCULAR | 1 refills | Status: AC

## 2017-12-14 NOTE — Patient Instructions (Addendum)
Continue doing brain stimulating activities (puzzles, reading, adult coloring books, staying active) to keep memory sharp.   Continue to eat heart healthy diet (full of fruits, vegetables, whole grains, lean protein, water--limit salt, fat, and sugar intake) and increase physical activity as tolerated.  Troy Jenkins , Thank you for taking time to come for your Medicare Wellness Visit. I appreciate your ongoing commitment to your health goals. Please review the following plan we discussed and let me know if I can assist you in the future.   These are the goals we discussed: Goals    . Patient Stated     Continue be as healthy and as independent as possible.        This is a list of the screening recommended for you and due dates:  Health Maintenance  Topic Date Due  . Flu Shot  02/11/2018*  . Tetanus Vaccine  10/01/2019  . Colon Cancer Screening  11/22/2020  . Pneumonia vaccines  Completed  *Topic was postponed. The date shown is not the original due date.     Marland Kitchen

## 2017-12-14 NOTE — Assessment & Plan Note (Signed)
No flare in the last year. Has indomethacin that he can use prn.

## 2017-12-14 NOTE — Progress Notes (Signed)
Medical screening examination/treatment/procedure(s) were performed by non-physician practitioner and as supervising physician I was immediately available for consultation/collaboration. I agree with above. Kayani Rapaport A Tanae Petrosky, MD 

## 2017-12-14 NOTE — Assessment & Plan Note (Signed)
Checking lipid panel and adjust lovastatin 20 mg daily as needed.  

## 2017-12-14 NOTE — Assessment & Plan Note (Signed)
BP at goal on lisinopril 20 mg daily. Reviewed recent CMP no adjustment needed.

## 2017-12-14 NOTE — Assessment & Plan Note (Signed)
Checking PSA today. Now 10 years out from definitive surgery.

## 2017-12-14 NOTE — Progress Notes (Signed)
Subjective:   Troy Jenkins is a 77 y.o. male who presents for Medicare Annual/Subsequent preventive examination.  Review of Systems:  No ROS.  Medicare Wellness Visit. Additional risk factors are reflected in the social history.  Cardiac Risk Factors include: advanced age (>106men, >79 women);dyslipidemia;hypertension;male gender Sleep patterns: feels rested on waking, gets up 1 times nightly to void and sleeps 7-8 hours nightly.   Home Safety/Smoke Alarms: Feels safe in home. Smoke alarms in place.  Living environment; residence and Firearm Safety: [place], no firearms Lives with wife, no needs for DME, good support system. Seat Belt Safety/Bike Helmet: Wears seat belt.      PSA-  Lab Results  Component Value Date   PSA 0.00 (L) 07/12/2017   PSA 0.00 (L) 11/29/2015   PSA 0.00 (L) 11/27/2014       Objective:    Vitals: There were no vitals taken for this visit.  There is no height or weight on file to calculate BMI.  Advanced Directives 12/14/2017 11/16/2015  Does Patient Have a Medical Advance Directive? Yes Yes  Type of Paramedic of [place];Living will [place];Living will  Does patient want to make changes to medical advance directive? - No - Patient declined  Copy of [place] in Chart? No - copy requested No - copy requested    Tobacco Social History   Tobacco Use  Smoking Status Former Smoker  . Last attempt to quit: 05/31/1968  . Years since quitting: 49.5  Smokeless Tobacco Never Used     Counseling given: Not Answered  Past Medical History:  Diagnosis Date  . Cancer [facility])    prostate  . GOUT 08/13/2007  . HYPERLIPIDEMIA 08/13/2007  . HYPERTENSION 08/13/2007  . Ligament tear 06/22/12   left wrist   . Skin cancer of arm 10/13   left arm; small skin cancer removed; low grade  . 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 COLONOSCOPY  07/2010    . HERNIA REPAIR  2010   Left   . PROSTATE SURGERY  12/2005  . TREATMENT FISTULA ANAL  1980   Family History  Problem Relation Age of Onset  . Leukemia Mother   . Diabetes Father   . Stroke Father   . Cancer Maternal Grandmother   . Stroke Paternal Grandfather   . Colon cancer Neg Hx    Social History   Socioeconomic History  . Marital status: Married    Spouse name: Not on file  . Number of children: Not on file  . Years of education: Not on file  . Highest education level: Not on file  Social Needs  . Financial resource strain: Not hard at all  . Food insecurity - worry: Never true  . Food insecurity - inability: Never true  . Transportation needs - medical: Not on file  . Transportation needs - non-medical: Not on file  Occupational History  . Not on file  Tobacco Use  . Smoking status: Former Smoker    Last attempt to quit: 05/31/1968    Years since quitting: 49.5  . Smokeless tobacco: Never Used  Substance and Sexual Activity  . Alcohol use: Yes    Alcohol/week: 0.0 oz    Comment: occasionally  . Drug use: No  . Sexual activity: Not Currently  Other Topics Concern  . Not on file  Social History Narrative   [employer], [employer]- textile degree. Married  1965. No children. Work - retired from [employer]. Manages Theatre manager. Advanced Care Planning - raised the issue for future discussion.    Outpatient Encounter Medications as of 12/14/2017  Medication Sig  . aspirin 81 MG tablet Take 81 mg by mouth daily.    Marland Kitchen glucosamine-chondroitin 500-400 MG tablet Take 1 tablet by mouth 2 (two) times daily with a meal.  . indomethacin (INDOCIN) 25 MG capsule Take 1 capsule (25 mg total) by mouth every 8 (eight) hours as needed.  Marland Kitchen lisinopril (PRINIVIL,ZESTRIL) 20 MG tablet Take 1 tablet (20 mg total) by mouth daily.  Marland Kitchen lovastatin (MEVACOR) 20 MG tablet Take 1 tablet (20 mg total) by mouth at bedtime.  . Multiple Vitamin (MULTIVITAMIN) tablet Take 1 tablet by mouth  daily.    . ondansetron (ZOFRAN ODT) 4 MG disintegrating tablet Take 1 tablet (4 mg total) by mouth every 8 (eight) hours as needed for nausea or vomiting.   No facility-administered encounter medications on file as of 12/14/2017.     Activities of Daily Living In your present state of health, do you have any difficulty performing the following activities: 12/14/2017  Hearing? N  Vision? N  Difficulty concentrating or making decisions? N  Walking or climbing stairs? N  Dressing or bathing? N  Doing errands, shopping? N  Preparing Food and eating ? N  Using the Toilet? N  In the past six months, have you accidently leaked urine? N  Do you have problems with loss of bowel control? N  Managing your Medications? N  Managing your Finances? N  Housekeeping or managing your Housekeeping? N  Some recent data might be hidden    Patient Care Team: Hoyt Koch, MD as PCP - General (Internal Medicine) Allyn Kenner, MD (Dermatology) Rutherford Guys, MD (Ophthalmology) Raynelle Bring, MD (Urology)   Assessment:   This is a routine wellness examination for Troy Jenkins. Physical assessment deferred to PCP.   Exercise Activities and Dietary recommendations Current Exercise Habits: Structured exercise class, Type of exercise: walking(plays golf several times weekly), Time (Minutes): 40, Frequency (Times/Week): 4, Weekly Exercise (Minutes/Week): 160, Intensity: Mild, Exercise limited by: None identified  Diet (meal preparation, eat out, water intake, caffeinated beverages, dairy products, fruits and vegetables): in general, a "healthy" diet  , well balanced   Reviewed heart healthy and diabetic diet, encouraged patient to increase daily water intake.   Goals    . Patient Stated     Continue be as healthy and as independent as possible.        Fall Risk Fall Risk  12/14/2017 11/29/2015  Falls in the past year? No No    Depression Screen PHQ 2/9 Scores 12/14/2017 12/14/2017 11/29/2015  PHQ - 2  Score 0 0 0  PHQ- 9 Score 0 - -    Cognitive Function MMSE - Mini Mental State Exam 12/14/2017  Orientation to time 5  Orientation to Place 5  Registration 3  Attention/ Calculation 5  Recall 3  Language- name 2 objects 2  Language- repeat 1  Language- follow 3 step command 3  Language- read & follow direction 1  Write a sentence 1  Copy design 1  Total score 30        Immunization History  Administered Date(s) Administered  . Influenza Whole 09/01/2012  . Influenza, High Dose Seasonal PF 08/28/2016  . Influenza-Unspecified 07/27/2013, 08/18/2014, 09/28/2015  . Pneumococcal Conjugate-13 11/26/2013, 11/14/2014  . Pneumococcal Polysaccharide-23 11/29/2015  . Td 09/30/2009   Screening Tests  Health Maintenance  Topic Date Due  . INFLUENZA VACCINE  02/11/2018 (Originally 06/13/2017)  . TETANUS/TDAP  10/01/2019  . COLONOSCOPY  11/22/2020  . PNA vac Low Risk Adult  Completed      Plan:    Continue doing brain stimulating activities (puzzles, reading, adult coloring books, staying active) to keep memory sharp.   Continue to eat heart healthy diet (full of fruits, vegetables, whole grains, lean protein, water--limit salt, fat, and sugar intake) and increase physical activity as tolerated.  I have personally reviewed and noted the following in the patient's chart:   . Medical and social history . Use of alcohol, tobacco or illicit drugs  . Current medications and supplements . Functional ability and status . Nutritional status . Physical activity . Advanced directives . List of other physicians . Vitals . Screenings to include cognitive, depression, and falls . Referrals and appointments  In addition, I have reviewed and discussed with patient certain preventive protocols, quality metrics, and best practice recommendations. A written personalized care plan for preventive services as well as general preventive health recommendations were provided to patient.     Michiel Cowboy, RN  12/14/2017

## 2017-12-14 NOTE — Assessment & Plan Note (Signed)
Colonoscopy due in 2022 for history of polyps. Tetanus due in 2020. Flu and pneumonia up to date. Given rx for shingrix. Counseled about sun safety and mole surveillance. Given 10 year screening recommendations.

## 2017-12-14 NOTE — Patient Instructions (Signed)
We will check the labs today and call you back with the results.    

## 2017-12-14 NOTE — Progress Notes (Signed)
   Subjective:    Patient ID: Troy Jenkins, male    DOB: February 19, 1941, 77 y.o.   MRN: [id number]  HPI The patient is a 77 YO man coming in for physical. No new concerns.  PMH, [facility], social history reviewed and updated.   Review of Systems  Constitutional: Negative.   HENT: Negative.   Eyes: Negative.   Respiratory: Negative for cough, chest tightness and shortness of breath.   Cardiovascular: Negative for chest pain, palpitations and leg swelling.  Gastrointestinal: Negative for abdominal distention, abdominal pain, constipation, diarrhea, nausea and vomiting.  Musculoskeletal: Negative.   Skin: Negative.   Neurological: Negative.   Psychiatric/Behavioral: Negative.       Objective:   Physical Exam  Constitutional: He is oriented to person, place, and time. He appears well-developed and well-nourished.  HENT:  Head: Normocephalic and atraumatic.  Eyes: EOM are normal.  Neck: Normal range of motion.  Cardiovascular: Normal rate and regular rhythm.  Pulmonary/Chest: Effort normal and breath sounds normal. No respiratory distress. He has no wheezes. He has no rales.  Abdominal: Soft. Bowel sounds are normal. He exhibits no distension. There is no tenderness. There is no rebound.  Musculoskeletal: He exhibits no edema.  Neurological: He is alert and oriented to person, place, and time. Coordination normal.  Skin: Skin is warm and dry.  Psychiatric: He has a normal mood and affect.   Vitals:   12/14/17 0920  BP: 138/86  Pulse: 85  Temp: 97.6 F (36.4 C)  TempSrc: Oral  SpO2: 98%  Weight: 194 lb (88 kg)  Height: 5\' 10"  (1.778 m)      Assessment & Plan:

## 2017-12-18 ENCOUNTER — Other Ambulatory Visit: Payer: Self-pay | Admitting: Internal Medicine

## 2018-09-09 ENCOUNTER — Other Ambulatory Visit: Payer: Self-pay | Admitting: Internal Medicine

## 2018-09-24 ENCOUNTER — Ambulatory Visit: Payer: Medicare Other | Admitting: Internal Medicine

## 2018-09-24 ENCOUNTER — Encounter: Payer: Self-pay | Admitting: Internal Medicine

## 2018-09-24 VITALS — BP 178/86 | HR 62 | Ht 70.0 in | Wt 198.2 lb

## 2018-09-24 DIAGNOSIS — R0602 Shortness of breath: Secondary | ICD-10-CM

## 2018-09-24 DIAGNOSIS — E782 Mixed hyperlipidemia: Secondary | ICD-10-CM

## 2018-09-24 DIAGNOSIS — I10 Essential (primary) hypertension: Secondary | ICD-10-CM

## 2018-09-24 DIAGNOSIS — I6521 Occlusion and stenosis of right carotid artery: Secondary | ICD-10-CM | POA: Diagnosis not present

## 2018-09-24 NOTE — Progress Notes (Signed)
OFFICE CONSULT NOTE  Chief Complaint:  Follow-up stress test  Primary Care Physician: Hoyt Koch, MD  HPI:  Troy Jenkins is a 77 y.o. male who is being seen today for the evaluation of the above complaint at the request of Hoyt Koch, *. Troy Jenkins presents for evaluation of shortness of breath and fatigue. Past medical history significant for hypertension, dyslipidemia, and prostate cancer. He was seen recently by his primary care provider who noted the onset of fatigue 4-6 weeks prior with shortness of breath and intermittent lightheadedness. He apparently had had a tick bite and thought it might be related to that. He underwent workup for Lyme disease and [facility] spotted fever. There is no evidence of fever however he tested positive for the IgG. This suggest prior exposure but no active infection. He was initially to be treated with doxycycline but apparently that was discontinued. There is discussion about a possible infectious disease consultation but he does not have an appointment. He says his fatigue is persisted since about that time but is slowly gotten a little better. It short of breath with minimal exertion. He denies any chest pain.  09/18/2017  Troy Jenkins returns today for follow-up of his stress test.  This was an exercise Myoview performed on 08/23/2017.  This demonstrated normal LVEF 57% with a small fixed inferior inferoseptal perfusion defect suggestive of artifact.  Overall the study was low risk.  He reports he has had no further chest pain or worsening shortness of breath.  In fact he did a lot of recent work in the yard and I was asymptomatic with that.  He inquired today however about vascular screening.  His father died of stroke in his 67s and is interested in further risk assessment.  09/24/2018  Troy Jenkins is seen today for annual follow-up.  Overall he continues to have some shortness of breath with marked exertion.  He denies any  chest pain.  He said he is shortness of breath had improved after stress testing but still is an issue.  He is inquiring as to whether or not there may be some underlying pulmonary disease and wishes to follow-up with his PCP on this.  He was found to have moderate right carotid artery stenosis and a carotid bruit on vascular screen.  This will need to be repeated.  PMHx:  Past Medical History:  Diagnosis Date  . Cancer [facility])    prostate  . GOUT 08/13/2007  . HYPERLIPIDEMIA 08/13/2007  . HYPERTENSION 08/13/2007  . Ligament tear 06/22/12   left wrist   . Skin cancer of arm 10/13   left arm; small skin cancer removed; low grade  . 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 COLONOSCOPY  07/2010  . HERNIA REPAIR  2010   Left   . PROSTATE SURGERY  12/2005  . TREATMENT FISTULA ANAL  1980    FAMHx:  Family History  Problem Relation Age of Onset  . Leukemia Mother   . Diabetes Father   . Stroke Father   . Cancer Maternal Grandmother   . Stroke Paternal Grandfather   . Colon cancer Neg Hx     SOCHx:   reports that he quit smoking about 50 years ago. He has never used smokeless tobacco. He reports that he drinks alcohol. He reports that he does not use drugs.  ALLERGIES:  Allergies  Allergen Reactions  . Amoxicillin Itching  . Doxycycline  Itching  . Iodine     IV dye    ROS: Pertinent items noted in HPI and remainder of comprehensive ROS otherwise negative.  HOME MEDS: Current Outpatient Medications on File Prior to Visit  Medication Sig Dispense Refill  . aspirin 81 MG tablet Take 81 mg by mouth daily.      Marland Kitchen glucosamine-chondroitin 500-400 MG tablet Take 1 tablet by mouth 2 (two) times daily with a meal. 180 tablet 3  . indomethacin (INDOCIN) 25 MG capsule Take 1 capsule (25 mg total) by mouth every 8 (eight) hours as needed. 90 capsule 0  . lisinopril (PRINIVIL,ZESTRIL) 20 MG tablet Take 1 tablet (20 mg total) by mouth daily.  Need annual appointment for further refills 90 tablet 0  . lovastatin (MEVACOR) 20 MG tablet TAKE 1 TABLET BY MOUTH AT  BEDTIME 90 tablet 3  . lovastatin (MEVACOR) 20 MG tablet Take 1 tablet (20 mg total) by mouth at bedtime. Need annual appointment for further refills 90 tablet 0  . Multiple Vitamin (MULTIVITAMIN) tablet Take 1 tablet by mouth daily.       No current facility-administered medications on file prior to visit.     LABS/IMAGING: No results found for this or any previous visit (from the past 48 hour(s)). No results found.  LIPID PANEL:    Component Value Date/Time   CHOL 173 12/14/2017 1038   TRIG 162.0 (H) 12/14/2017 1038   HDL 65.70 12/14/2017 1038   CHOLHDL 3 12/14/2017 1038   VLDL 32.4 12/14/2017 1038   LDLCALC 75 12/14/2017 1038   LDLDIRECT 99.3 07/23/2008 1216    WEIGHTS: Wt Readings from Last 3 Encounters:  09/24/18 198 lb 3.2 oz (89.9 kg)  12/14/17 194 lb (88 kg)  09/18/17 194 lb (88 kg)    VITALS: BP (!) 178/86   Pulse 62   Ht 5\' 10"  (1.778 m)   Wt 198 lb 3.2 oz (89.9 kg)   BMI 28.44 kg/m   EXAM: General appearance: alert and no distress Neck: no JVD, thyroid not enlarged, symmetric, no tenderness/mass/nodules and Right carotid bruit Lungs: clear to auscultation bilaterally Heart: regular rate and rhythm Abdomen: soft, non-tender; bowel sounds normal; no masses,  no organomegaly Extremities: extremities normal, atraumatic, no cyanosis or edema Pulses: 2+ and symmetric Skin: Skin color, texture, turgor normal. No rashes or lesions Neurologic: Grossly normal Psych: Pleasant  EKG: Emil sinus rhythm at 61-personally reviewed  ASSESSMENT: 1. Dyspnea on exertion -low risk exercise Myoview with normal LV function (08/2017) 2. Fatigue 3. Hypertension 4. Dyslipidemia  PLAN: 1.   Troy Jenkins denies any worsening chest pain and has stable shortness of breath.  He had a low risk Myoview with normal LV function in 2018.  He is concerned about  possible pulmonary etiologies.  His x-ray been clear and I do not hear any adventitious lung sounds today.  Blood pressure was elevated initially however came down.  His cholesterol is been followed by his PCP-LDL 9 months ago was 75 on lovastatin.  Triglycerides remain elevated at 162.  He is noted to have right carotid artery stenosis.  He will need a repeat carotid Doppler as of December.  His goal LDL is less than 70 and triglycerides are mildly elevated.  I would recommend continued aggressive dietary intervention and consider a high intensity statin such as atorvastatin 40 mg for additional benefit.  Follow-up annually or sooner as necessary.  Pixie Casino, MD, [facility], [place] Director  of the Advanced Lipid Disorders &  Cardiovascular Risk Reduction Clinic Diplomate of the American Board of Clinical Lipidology Attending Cardiologist  Direct Dial: [phone]  Fax: [phone]  Website:  [email or web address] 09/24/2018, 3:18 PM

## 2018-09-24 NOTE — Patient Instructions (Signed)
Medication Instructions:  Continue current medications If you need a refill on your cardiac medications before your next appointment, please call your pharmacy.   Testing/Procedures: Schedule carotid doppler in December  Follow-Up: At [facility] you and your health needs are our priority.  As part of our continuing mission to provide you with exceptional heart care, we have created designated Provider Care Teams.  These Care Teams include your primary Cardiologist (physician) and Advanced Practice Providers (APPs -  Physician Assistants and Nurse Practitioners) who all work together to provide you with the care you need, when you need it. You will need a follow up appointment in 12 months.  Please call our office 2 months in advance to schedule this appointment.  You may see Pixie Casino, MD or one of the following Advanced Practice Providers on your designated Care Team: [place], [place] . Fabian Sharp, PA-C  Any Other Special Instructions Will Be Listed Below (If Applicable).

## 2018-09-25 DIAGNOSIS — H5310 Unspecified subjective visual disturbances: Secondary | ICD-10-CM | POA: Diagnosis not present

## 2018-09-25 DIAGNOSIS — H43393 Other vitreous opacities, bilateral: Secondary | ICD-10-CM | POA: Diagnosis not present

## 2018-09-25 DIAGNOSIS — H2513 Age-related nuclear cataract, bilateral: Secondary | ICD-10-CM | POA: Diagnosis not present

## 2018-09-25 DIAGNOSIS — H43813 Vitreous degeneration, bilateral: Secondary | ICD-10-CM | POA: Diagnosis not present

## 2018-10-30 ENCOUNTER — Ambulatory Visit (HOSPITAL_COMMUNITY)
Admission: RE | Admit: 2018-10-30 | Discharge: 2018-10-30 | Disposition: A | Discharge status: Home / Self Care | Payer: Medicare Other | Source: Ambulatory Visit | Attending: Cardiovascular Disease | Admitting: Cardiovascular Disease

## 2018-10-30 DIAGNOSIS — I6521 Occlusion and stenosis of right carotid artery: Secondary | ICD-10-CM | POA: Insufficient documentation

## 2018-11-04 ENCOUNTER — Other Ambulatory Visit: Payer: Self-pay | Admitting: *Deleted

## 2018-11-04 ENCOUNTER — Telehealth: Payer: Self-pay | Admitting: Internal Medicine

## 2018-11-04 DIAGNOSIS — I6521 Occlusion and stenosis of right carotid artery: Secondary | ICD-10-CM

## 2018-11-04 NOTE — Telephone Encounter (Signed)
Notes recorded by Pixie Casino, MD on 10/30/2018 at 6:03 PM EST Worsening moderate to severe RICA stenosis. Repeat carotid dopplers in 6 months.  Dr. Lemmie Evens

## 2018-11-04 NOTE — Telephone Encounter (Signed)
New message    Pt returning a phone call from dr hilty nurse about test results .please follow up

## 2018-11-04 NOTE — Telephone Encounter (Signed)
Returned call to patient. Notified him of results. Repeat doppler for 6 months has been entered and he is aware someone will call him to set up test.

## 2018-12-16 NOTE — Progress Notes (Signed)
Subjective:   Troy Jenkins is a 78 y.o. male who presents for Medicare Annual/Subsequent preventive examination.  Review of Systems:  No ROS.  Medicare Wellness Visit. Additional risk factors are reflected in the social history.    Sleep patterns: feels rested on waking, gets up 1 times nightly to void and sleeps 6 hours nightly.    Home Safety/Smoke Alarms: Feels safe in home. Smoke alarms in place.  Living environment; residence and Firearm Safety: 2-story house, no firearms. Lives with wife, no needs for DME, good support system Seat Belt Safety/Bike Helmet: Wears seat belt.   PSA-  Lab Results  Component Value Date   PSA 0.00 (L) 12/14/2017   PSA 0.00 (L) 07/12/2017   PSA 0.00 (L) 11/29/2015       Objective:    Vitals: BP (!) 153/70   Pulse 64   Ht 5\' 10"  (1.778 m)   Wt 197 lb (89.4 kg)   SpO2 99%   BMI 28.27 kg/m   Body mass index is 28.27 kg/m.  Advanced Directives 12/17/2018 12/14/2017 11/16/2015  Does Patient Have a Medical Advance Directive? Yes Yes Yes  Type of Paramedic of [place];Living will [place];Living will [place];Living will  Does patient want to make changes to medical advance directive? - - No - Patient declined  Copy of [place] in Chart? No - copy requested No - copy requested No - copy requested    Tobacco Social History   Tobacco Use  Smoking Status Former Smoker  . Last attempt to quit: 05/31/1968  . Years since quitting: 50.5  Smokeless Tobacco Never Used     Counseling given: Not Answered  Past Medical History:  Diagnosis Date  . Cancer [facility])    prostate  . GOUT 08/13/2007  . HYPERLIPIDEMIA 08/13/2007  . HYPERTENSION 08/13/2007  . Ligament tear 06/22/12   left wrist   . Skin cancer of arm 10/13   left arm; small skin cancer removed; low grade  . 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  COLONOSCOPY  07/2010  . HERNIA REPAIR  2010   Left   . PROSTATE SURGERY  12/2005  . TREATMENT FISTULA ANAL  1980   Family History  Problem Relation Age of Onset  . Leukemia Mother   . Diabetes Father   . Stroke Father   . Cancer Maternal Grandmother   . Stroke Paternal Grandfather   . Colon cancer Neg Hx    Social History   Socioeconomic History  . Marital status: Married    Spouse name: Not on file  . Number of children: 0  . Years of education: Not on file  . Highest education level: Not on file  Occupational History  . Occupation: retired  Scientific laboratory technician  . Financial resource strain: Not hard at all  . Food insecurity:    Worry: Never true    Inability: Never true  . Transportation needs:    Medical: No    Non-medical: No  Tobacco Use  . Smoking status: Former Smoker    Last attempt to quit: 05/31/1968    Years since quitting: 50.5  . Smokeless tobacco: Never Used  Substance and Sexual Activity  . Alcohol use: Yes    Alcohol/week: 0.0 standard drinks    Comment: occasionally  . Drug use: No  . Sexual activity: Not Currently  Lifestyle  . Physical activity:  Days per week: 0 days    Minutes per session: 0 min  . Stress: Not at all  Relationships  . Social connections:    Talks on phone: More than three times a week    Gets together: More than three times a week    Attends religious service: 1 to 4 times per year    Active member of club or organization: Yes    Attends meetings of clubs or organizations: More than 4 times per year    Relationship status: Married  Other Topics Concern  . 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 Married 1965. No children. Work - retired from [employer]. Manages Theatre manager. Advanced Care Planning - raised the issue for future discussion.    Outpatient Encounter Medications as of 12/17/2018  Medication Sig  . aspirin 81 MG tablet Take 81 mg by mouth daily.    Marland Kitchen  glucosamine-chondroitin 500-400 MG tablet Take 1 tablet by mouth 2 (two) times daily with a meal.  . lisinopril (PRINIVIL,ZESTRIL) 20 MG tablet Take 1 tablet (20 mg total) by mouth daily.  Marland Kitchen lovastatin (MEVACOR) 20 MG tablet Take 1 tablet (20 mg total) by mouth at bedtime.  . Multiple Vitamin (MULTIVITAMIN) tablet Take 1 tablet by mouth daily.    . [DISCONTINUED] indomethacin (INDOCIN) 25 MG capsule Take 1 capsule (25 mg total) by mouth every 8 (eight) hours as needed. (Patient not taking: Reported on 12/17/2018)  . [DISCONTINUED] lisinopril (PRINIVIL,ZESTRIL) 20 MG tablet Take 1 tablet (20 mg total) by mouth daily. Need annual appointment for further refills  . [DISCONTINUED] lovastatin (MEVACOR) 20 MG tablet TAKE 1 TABLET BY MOUTH AT  BEDTIME  . [DISCONTINUED] lovastatin (MEVACOR) 20 MG tablet Take 1 tablet (20 mg total) by mouth at bedtime. Need annual appointment for further refills   No facility-administered encounter medications on file as of 12/17/2018.     Activities of Daily Living In your present state of health, do you have any difficulty performing the following activities: 12/17/2018  Hearing? N  Vision? N  Some recent data might be hidden    Patient Care Team: Hoyt Koch, MD as PCP - General (Internal Medicine) Debara Pickett Nadean Corwin, MD as PCP - Cardiology (Cardiology) Allyn Kenner, MD (Dermatology) Raynelle Bring, MD (Urology)   Assessment:   This is a routine wellness examination for Troy Jenkins. Physical assessment deferred to PCP.  Exercise Activities and Dietary recommendations   Diet (meal preparation, eat out, water intake, caffeinated beverages, dairy products, fruits and vegetables): in general, a "healthy" diet  , well balanced   Reviewed heart healthy diet. Encouraged patient to increase daily water and healthy fluid intake.  Goals    . Patient Stated     Continue be as healthy and as independent as possible.     . Patient Stated     Continue eat healthy and  stay physically and socially active.        Fall Risk Fall Risk  12/17/2018 12/14/2017 11/29/2015  Falls in the past year? 0 No No    Depression Screen PHQ 2/9 Scores 12/17/2018 12/14/2017 12/14/2017 11/29/2015  PHQ - 2 Score 0 0 0 0  PHQ- 9 Score - 0 - -    Cognitive Function MMSE - Mini Mental State Exam 12/14/2017  Orientation to time 5  Orientation to Place 5  Registration 3  Attention/ Calculation 5  Recall 3  Language- name 2 objects 2  Language- repeat  1  Language- follow 3 step command 3  Language- read & follow direction 1  Write a sentence 1  Copy design 1  Total score 30       Ad8 score reviewed for issues:  Issues making decisions: no  Less interest in hobbies / activities: no  Repeats questions, stories (family complaining): no  Trouble using ordinary gadgets (microwave, computer, phone):no  Forgets the month or year: no  Mismanaging finances: no  Remembering appts: no  Daily problems with thinking and/or memory: no Ad8 score is= 0  Immunization History  Administered Date(s) Administered  . Influenza Whole 09/01/2012  . Influenza, High Dose Seasonal PF 08/28/2016  . Influenza-Unspecified 07/27/2013, 08/18/2014, 09/28/2015, 09/11/2018  . Pneumococcal Conjugate-13 11/26/2013, 11/14/2014  . Pneumococcal Polysaccharide-23 11/29/2015  . Td 09/30/2009  . Zoster Recombinat (Shingrix) 07/10/2018   Screening Tests Health Maintenance  Topic Date Due  . TETANUS/TDAP  10/01/2019  . COLONOSCOPY  11/22/2020  . INFLUENZA VACCINE  Completed  . PNA vac Low Risk Adult  Completed       Plan:     Reviewed health maintenance screenings with patient today and relevant education, vaccines, and/or referrals were provided.   Continue doing brain stimulating activities (puzzles, reading, adult coloring books, staying active) to keep memory sharp.   Continue to eat heart healthy diet (full of fruits, vegetables, whole grains, lean protein, water--limit salt, fat, and  sugar intake) and increase physical activity as tolerated.  I have personally reviewed and noted the following in the patient's chart:   . Medical and social history . Use of alcohol, tobacco or illicit drugs  . Current medications and supplements . Functional ability and status . Nutritional status . Physical activity . Advanced directives . List of other physicians . Vitals . Screenings to include cognitive, depression, and falls . Referrals and appointments  In addition, I have reviewed and discussed with patient certain preventive protocols, quality metrics, and best practice recommendations. A written personalized care plan for preventive services as well as general preventive health recommendations were provided to patient.     Michiel Cowboy, RN  12/17/2018

## 2018-12-17 ENCOUNTER — Encounter: Payer: Self-pay | Admitting: Internal Medicine

## 2018-12-17 ENCOUNTER — Other Ambulatory Visit (INDEPENDENT_AMBULATORY_CARE_PROVIDER_SITE_OTHER): Payer: Medicare Other

## 2018-12-17 ENCOUNTER — Ambulatory Visit (INDEPENDENT_AMBULATORY_CARE_PROVIDER_SITE_OTHER): Payer: Medicare Other | Admitting: Internal Medicine

## 2018-12-17 ENCOUNTER — Ambulatory Visit (INDEPENDENT_AMBULATORY_CARE_PROVIDER_SITE_OTHER): Payer: Medicare Other | Admitting: *Deleted

## 2018-12-17 VITALS — BP 153/70 | HR 64 | Ht 70.0 in | Wt 197.0 lb

## 2018-12-17 VITALS — BP 160/90 | HR 69 | Temp 98.2°F | Ht 70.0 in | Wt 197.0 lb

## 2018-12-17 DIAGNOSIS — R0602 Shortness of breath: Secondary | ICD-10-CM | POA: Diagnosis not present

## 2018-12-17 DIAGNOSIS — Z8546 Personal history of malignant neoplasm of prostate: Secondary | ICD-10-CM | POA: Diagnosis not present

## 2018-12-17 DIAGNOSIS — E782 Mixed hyperlipidemia: Secondary | ICD-10-CM

## 2018-12-17 DIAGNOSIS — Z0000 Encounter for general adult medical examination without abnormal findings: Secondary | ICD-10-CM | POA: Diagnosis not present

## 2018-12-17 DIAGNOSIS — I10 Essential (primary) hypertension: Secondary | ICD-10-CM

## 2018-12-17 DIAGNOSIS — Z0001 Encounter for general adult medical examination with abnormal findings: Secondary | ICD-10-CM

## 2018-12-17 LAB — COMPREHENSIVE METABOLIC PANEL WITH GFR
ALT: 17 U/L (ref 0–53)
AST: 20 U/L (ref 0–37)
Albumin: 4.5 g/dL (ref 3.5–5.2)
Alkaline Phosphatase: 57 U/L (ref 39–117)
BUN: 19 mg/dL (ref 6–23)
CO2: 27 meq/L (ref 19–32)
Calcium: 10 mg/dL (ref 8.4–10.5)
Chloride: 104 meq/L (ref 96–112)
Creatinine, Ser: 1 mg/dL (ref 0.40–1.50)
GFR: 72.37 mL/min
Glucose, Bld: 98 mg/dL (ref 70–99)
Potassium: 4.6 meq/L (ref 3.5–5.1)
Sodium: 140 meq/L (ref 135–145)
Total Bilirubin: 0.4 mg/dL (ref 0.2–1.2)
Total Protein: 7.1 g/dL (ref 6.0–8.3)

## 2018-12-17 LAB — PSA: PSA: 0 ng/mL — ABNORMAL LOW (ref 0.10–4.00)

## 2018-12-17 LAB — CBC
HCT: 30.5 % — ABNORMAL LOW (ref 39.0–52.0)
Hemoglobin: 9.4 g/dL — ABNORMAL LOW (ref 13.0–17.0)
MCHC: 30.9 g/dL (ref 30.0–36.0)
MCV: 65.1 fl — ABNORMAL LOW (ref 78.0–100.0)
Platelets: 297 K/uL (ref 150.0–400.0)
RBC: 4.68 Mil/uL (ref 4.22–5.81)
RDW: 17.8 % — ABNORMAL HIGH (ref 11.5–15.5)
WBC: 4.1 K/uL (ref 4.0–10.5)

## 2018-12-17 LAB — LIPID PANEL
CHOL/HDL RATIO: 3
Cholesterol: 163 mg/dL (ref 0–200)
HDL: 48.6 mg/dL (ref >39.00)
LDL Cholesterol: 82 mg/dL (ref 0–99)
NONHDL: 114.59
Triglycerides: 165 mg/dL — ABNORMAL HIGH (ref 0.0–149.0)
VLDL: 33 mg/dL (ref 0.0–40.0)

## 2018-12-17 MED ORDER — LISINOPRIL 20 MG PO TABS: 20.0000 mg | ORAL_TABLET | Freq: Every day | ORAL | 3 refills | Status: DC

## 2018-12-17 MED ORDER — LOVASTATIN 20 MG PO TABS: 20.0000 mg | ORAL_TABLET | Freq: Every day | ORAL | 3 refills | Status: DC

## 2018-12-17 NOTE — Patient Instructions (Signed)
Health Maintenance After Age 78 After age 78, you are at a higher risk for certain long-term diseases and infections as well as injuries from falls. Falls are a major cause of broken bones and head injuries in people who are older than age 78. Getting regular preventive care can help to keep you healthy and well. Preventive care includes getting regular testing and making lifestyle changes as recommended by your health care provider. Talk with your health care provider about:  Which screenings and tests you should have. A screening is a test that checks for a disease when you have no symptoms.  A diet and exercise plan that is right for you. What should I know about screenings and tests to prevent falls? Screening and testing are the best ways to find a health problem early. Early diagnosis and treatment give you the best chance of managing medical conditions that are common after age 78. Certain conditions and lifestyle choices may make you more likely to have a fall. Your health care provider may recommend:  Regular vision checks. Poor vision and conditions such as cataracts can make you more likely to have a fall. If you wear glasses, make sure to get your prescription updated if your vision changes.  Medicine review. Work with your health care provider to regularly review all of the medicines you are taking, including over-the-counter medicines. Ask your health care provider about any side effects that may make you more likely to have a fall. Tell your health care provider if any medicines that you take make you feel dizzy or sleepy.  Osteoporosis screening. Osteoporosis is a condition that causes the bones to get weaker. This can make the bones weak and cause them to break more easily.  Blood pressure screening. Blood pressure changes and medicines to control blood pressure can make you feel dizzy.  Strength and balance checks. Your health care provider may recommend certain tests to check your  strength and balance while standing, walking, or changing positions.  Foot health exam. Foot pain and numbness, as well as not wearing proper footwear, can make you more likely to have a fall.  Depression screening. You may be more likely to have a fall if you have a fear of falling, feel emotionally low, or feel unable to do activities that you used to do.  Alcohol use screening. Using too much alcohol can affect your balance and may make you more likely to have a fall. What actions can I take to lower my risk of falls? General instructions  Talk with your health care provider about your risks for falling. Tell your health care provider if: ? You fall. Be sure to tell your health care provider about all falls, even ones that seem minor. ? You feel dizzy, sleepy, or off-balance.  Take over-the-counter and prescription medicines only as told by your health care provider. These include any supplements.  Eat a healthy diet and maintain a healthy weight. A healthy diet includes low-fat dairy products, low-fat (lean) meats, and fiber from whole grains, beans, and lots of fruits and vegetables. Home safety  Remove any tripping hazards, such as rugs, cords, and clutter.  Install safety equipment such as grab bars in bathrooms and safety rails on stairs.  Keep rooms and walkways well-lit. Activity   Follow a regular exercise program to stay fit. This will help you maintain your balance. Ask your health care provider what types of exercise are appropriate for you.  If you need a cane or   walker, use it as recommended by your health care provider.  Wear supportive shoes that have nonskid soles. Lifestyle  Do not drink alcohol if your health care provider tells you not to drink.  If you drink alcohol, limit how much you have: ? 0-1 drink a day for women. ? 0-2 drinks a day for men.  Be aware of how much alcohol is in your drink. In the [place] one drink equals one typical bottle of beer (12  oz), one-half glass of wine (5 oz), or one shot of hard liquor (1 oz).  Do not use any products that contain nicotine or tobacco, such as cigarettes and e-cigarettes. If you need help quitting, ask your health care provider. Summary  Having a healthy lifestyle and getting preventive care can help to protect your health and wellness after age 78.  Screening and testing are the best way to find a health problem early and help you avoid having a fall. Early diagnosis and treatment give you the best chance for managing medical conditions that are more common for people who are older than age 78.  Falls are a major cause of broken bones and head injuries in people who are older than age 78. Take precautions to prevent a fall at home.  Work with your health care provider to learn what changes you can make to improve your health and wellness and to prevent falls. This information is not intended to replace advice given to you by your health care provider. Make sure you discuss any questions you have with your health care provider. Document Released: 09/12/2017 Document Revised: 09/12/2017 Document Reviewed: 09/12/2017 Elsevier Interactive Patient Education  2019 Elsevier Inc.  

## 2018-12-17 NOTE — Progress Notes (Signed)
Medical screening examination/treatment/procedure(s) were performed by non-physician practitioner and as supervising physician I was immediately available for consultation/collaboration. I agree with above. Elizabeth A Crawford, MD 

## 2018-12-17 NOTE — Assessment & Plan Note (Signed)
Checking PSA 

## 2018-12-17 NOTE — Assessment & Plan Note (Signed)
Flu shot declines. Pneumonia declines. Shingrix counesled. Tetanus up to date. Colonoscopy up to date. Counseled about sun safety and mole surveillance. Counseled about the dangers of distracted driving. Given 10 year screening recommendations.

## 2018-12-17 NOTE — Assessment & Plan Note (Signed)
Ordered PFTs. Cardiology does not think heart related. Prior CXR normal and clear lung exam. Remote smoking history.

## 2018-12-17 NOTE — Patient Instructions (Signed)
Continue doing brain stimulating activities (puzzles, reading, adult coloring books, staying active) to keep memory sharp.   Continue to eat heart healthy diet (full of fruits, vegetables, whole grains, lean protein, water--limit salt, fat, and sugar intake) and increase physical activity as tolerated.   Troy Jenkins , Thank you for taking time to come for your Medicare Wellness Visit. I appreciate your ongoing commitment to your health goals. Please review the following plan we discussed and let me know if I can assist you in the future.   These are the goals we discussed: Goals    . Patient Stated     Continue be as healthy and as independent as possible.     . Patient Stated     Continue eat healthy and stay physically and socially active.        This is a list of the screening recommended for you and due dates:  Health Maintenance  Topic Date Due  . Tetanus Vaccine  10/01/2019  . Colon Cancer Screening  11/22/2020  . Flu Shot  Completed  . Pneumonia vaccines  Completed    Preventive Care 79 Years and Older, Male Preventive care refers to lifestyle choices and visits with your health care provider that can promote health and wellness. What does preventive care include?   A yearly physical exam. This is also called an annual well check.  Dental exams once or twice a year.  Routine eye exams. Ask your health care provider how often you should have your eyes checked.  Personal lifestyle choices, including: ? Daily care of your teeth and gums. ? Regular physical activity. ? Eating a healthy diet. ? Avoiding tobacco and drug use. ? Limiting alcohol use. ? Practicing safe sex. ? Taking low doses of aspirin every day. ? Taking vitamin and mineral supplements as recommended by your health care provider. What happens during an annual well check? The services and screenings done by your health care provider during your annual well check will depend on your age, overall  health, lifestyle risk factors, and family history of disease. Counseling Your health care provider may ask you questions about your:  Alcohol use.  Tobacco use.  Drug use.  Emotional well-being.  Home and relationship well-being.  Sexual activity.  Eating habits.  History of falls.  Memory and ability to understand (cognition).  Work and work Statistician. Screening You may have the following tests or measurements:  Height, weight, and BMI.  Blood pressure.  Lipid and cholesterol levels. These may be checked every 5 years, or more frequently if you are over 47 years old.  Skin check.  Lung cancer screening. You may have this screening every year starting at age 40 if you have a 30-pack-year history of smoking and currently smoke or have quit within the past 15 years.  Colorectal cancer screening. All adults should have this screening starting at age 77 and continuing until age 38. You will have tests every 1-10 years, depending on your results and the type of screening test. People at increased risk should start screening at an earlier age. Screening tests may include: ? Guaiac-based fecal occult blood testing. ? Fecal immunochemical test (FIT). ? Stool DNA test. ? Virtual colonoscopy. ? Sigmoidoscopy. During this test, a flexible tube with a tiny camera (sigmoidoscope) is used to examine your rectum and lower colon. The sigmoidoscope is inserted through your anus into your rectum and lower colon. ? Colonoscopy. During this test, a long, thin, flexible tube with a tiny  camera (colonoscope) is used to examine your entire colon and rectum.  Prostate cancer screening. Recommendations will vary depending on your family history and other risks.  Hepatitis C blood test.  Hepatitis B blood test.  Sexually transmitted disease (STD) testing.  Diabetes screening. This is done by checking your blood sugar (glucose) after you have not eaten for a while (fasting). You may have  this done every 1-3 years.  Abdominal aortic aneurysm (AAA) screening. You may need this if you are a current or former smoker.  Osteoporosis. You may be screened starting at age 41 if you are at high risk. Talk with your health care provider about your test results, treatment options, and if necessary, the need for more tests. Vaccines Your health care provider may recommend certain vaccines, such as:  Influenza vaccine. This is recommended every year.  Tetanus, diphtheria, and acellular pertussis (Tdap, Td) vaccine. You may need a Td booster every 10 years.  Varicella vaccine. You may need this if you have not been vaccinated.  Zoster vaccine. You may need this after age 44.  Measles, mumps, and rubella (MMR) vaccine. You may need at least one dose of MMR if you were born in 1957 or later. You may also need a second dose.  Pneumococcal 13-valent conjugate (PCV13) vaccine. One dose is recommended after age 55.  Pneumococcal polysaccharide (PPSV23) vaccine. One dose is recommended after age 59.  Meningococcal vaccine. You may need this if you have certain conditions.  Hepatitis A vaccine. You may need this if you have certain conditions or if you travel or work in places where you may be exposed to hepatitis A.  Hepatitis B vaccine. You may need this if you have certain conditions or if you travel or work in places where you may be exposed to hepatitis B.  Haemophilus influenzae type b (Hib) vaccine. You may need this if you have certain risk factors. Talk to your health care provider about which screenings and vaccines you need and how often you need them. This information is not intended to replace advice given to you by your health care provider. Make sure you discuss any questions you have with your health care provider. Document Released: 11/26/2015 Document Revised: 12/20/2017 Document Reviewed: 08/31/2015 Elsevier Interactive Patient Education  2019 [employer].

## 2018-12-17 NOTE — Progress Notes (Signed)
   Subjective:   Patient ID: Troy Jenkins, male    DOB: [birth year], 78 y.o.   MRN: [id number]  HPI The patient is a 78 YO man coming in for physical.   PMH, [place], social history reviewed and updated  Review of Systems  Constitutional: Negative.   HENT: Negative.   Eyes: Negative.   Respiratory: Positive for shortness of breath. Negative for cough and chest tightness.   Cardiovascular: Negative for chest pain, palpitations and leg swelling.  Gastrointestinal: Negative for abdominal distention, abdominal pain, constipation, diarrhea, nausea and vomiting.  Musculoskeletal: Negative.   Skin: Negative.   Neurological: Negative.   Psychiatric/Behavioral: Negative.     Objective:  Physical Exam Constitutional:      Appearance: He is well-developed.  HENT:     Head: Normocephalic and atraumatic.  Neck:     Musculoskeletal: Normal range of motion.  Cardiovascular:     Rate and Rhythm: Normal rate and regular rhythm.  Pulmonary:     Effort: Pulmonary effort is normal. No respiratory distress.     Breath sounds: Normal breath sounds. No wheezing or rales.  Abdominal:     General: Bowel sounds are normal. There is no distension.     Palpations: Abdomen is soft.     Tenderness: There is no abdominal tenderness. There is no rebound.  Skin:    General: Skin is warm and dry.  Neurological:     Mental Status: He is alert and oriented to person, place, and time.     Coordination: Coordination normal.    Vitals:   12/17/18 0922  BP: (!) 160/90  Pulse: 69  Temp: 98.2 F (36.8 C)  TempSrc: Oral  SpO2: 99%  Weight: 197 lb (89.4 kg)  Height: 5\' 10"  (1.778 m)    Assessment & Plan:

## 2018-12-17 NOTE — Assessment & Plan Note (Signed)
Taking lisinopril 20 mg daily and checking CMP and adjust as needed.

## 2018-12-17 NOTE — Assessment & Plan Note (Signed)
Checking lipid panel and adjust lovastatin as needed.

## 2018-12-18 ENCOUNTER — Encounter: Payer: Self-pay | Admitting: Internal Medicine

## 2018-12-18 DIAGNOSIS — D649 Anemia, unspecified: Secondary | ICD-10-CM

## 2018-12-26 DIAGNOSIS — X32XXXD Exposure to sunlight, subsequent encounter: Secondary | ICD-10-CM | POA: Diagnosis not present

## 2018-12-26 DIAGNOSIS — Z1283 Encounter for screening for malignant neoplasm of skin: Secondary | ICD-10-CM | POA: Diagnosis not present

## 2018-12-26 DIAGNOSIS — D2272 Melanocytic nevi of left lower limb, including hip: Secondary | ICD-10-CM | POA: Diagnosis not present

## 2018-12-26 DIAGNOSIS — D225 Melanocytic nevi of trunk: Secondary | ICD-10-CM | POA: Diagnosis not present

## 2018-12-26 DIAGNOSIS — L820 Inflamed seborrheic keratosis: Secondary | ICD-10-CM | POA: Diagnosis not present

## 2018-12-26 DIAGNOSIS — L570 Actinic keratosis: Secondary | ICD-10-CM | POA: Diagnosis not present

## 2018-12-26 DIAGNOSIS — D485 Neoplasm of uncertain behavior of skin: Secondary | ICD-10-CM | POA: Diagnosis not present

## 2019-01-03 ENCOUNTER — Encounter: Payer: Self-pay | Admitting: Internal Medicine

## 2019-01-03 MED ORDER — LISINOPRIL 10 MG PO TABS: 10.0000 mg | ORAL_TABLET | Freq: Every day | ORAL | 3 refills | Status: DC

## 2019-01-07 DIAGNOSIS — L988 Other specified disorders of the skin and subcutaneous tissue: Secondary | ICD-10-CM | POA: Diagnosis not present

## 2019-01-07 DIAGNOSIS — D485 Neoplasm of uncertain behavior of skin: Secondary | ICD-10-CM | POA: Diagnosis not present

## 2019-04-24 DIAGNOSIS — C44622 Squamous cell carcinoma of skin of right upper limb, including shoulder: Secondary | ICD-10-CM | POA: Diagnosis not present

## 2019-04-24 DIAGNOSIS — X32XXXD Exposure to sunlight, subsequent encounter: Secondary | ICD-10-CM | POA: Diagnosis not present

## 2019-04-24 DIAGNOSIS — Z1283 Encounter for screening for malignant neoplasm of skin: Secondary | ICD-10-CM | POA: Diagnosis not present

## 2019-04-24 DIAGNOSIS — D225 Melanocytic nevi of trunk: Secondary | ICD-10-CM | POA: Diagnosis not present

## 2019-04-24 DIAGNOSIS — L570 Actinic keratosis: Secondary | ICD-10-CM | POA: Diagnosis not present

## 2019-04-24 DIAGNOSIS — B078 Other viral warts: Secondary | ICD-10-CM | POA: Diagnosis not present

## 2019-05-28 ENCOUNTER — Telehealth: Payer: Self-pay | Admitting: Internal Medicine

## 2019-05-28 NOTE — Telephone Encounter (Signed)
Patient called back - scheduled PFT for 06/26/2019-pr

## 2019-06-12 ENCOUNTER — Other Ambulatory Visit: Payer: Self-pay | Admitting: Internal Medicine

## 2019-06-19 DIAGNOSIS — Z08 Encounter for follow-up examination after completed treatment for malignant neoplasm: Secondary | ICD-10-CM | POA: Diagnosis not present

## 2019-06-19 DIAGNOSIS — Z85828 Personal history of other malignant neoplasm of skin: Secondary | ICD-10-CM | POA: Diagnosis not present

## 2019-06-23 ENCOUNTER — Other Ambulatory Visit: Payer: Self-pay

## 2019-06-23 ENCOUNTER — Other Ambulatory Visit (HOSPITAL_COMMUNITY)
Admission: RE | Admit: 2019-06-23 | Discharge: 2019-06-23 | Disposition: A | Discharge status: Home / Self Care | Payer: Medicare Other | Source: Ambulatory Visit | Attending: Internal Medicine | Admitting: Internal Medicine

## 2019-06-23 DIAGNOSIS — Z20828 Contact with and (suspected) exposure to other viral communicable diseases: Secondary | ICD-10-CM | POA: Diagnosis not present

## 2019-06-23 DIAGNOSIS — Z01812 Encounter for preprocedural laboratory examination: Secondary | ICD-10-CM | POA: Diagnosis not present

## 2019-06-23 LAB — SARS CORONAVIRUS 2 (TAT 6-24 HRS): SARS Coronavirus 2: NEGATIVE

## 2019-06-26 ENCOUNTER — Other Ambulatory Visit: Payer: Self-pay

## 2019-06-26 ENCOUNTER — Ambulatory Visit (INDEPENDENT_AMBULATORY_CARE_PROVIDER_SITE_OTHER): Payer: Medicare Other | Admitting: Internal Medicine

## 2019-06-26 DIAGNOSIS — R0602 Shortness of breath: Secondary | ICD-10-CM

## 2019-06-26 LAB — PULMONARY FUNCTION TEST
DL/VA % pred: 106 %
DL/VA: 4.2 ml/min/mmHg/L
DLCO unc % pred: 105 %
DLCO unc: 25.57 ml/min/mmHg
FEF 25-75 Post: 3.07 L/sec
FEF 25-75 Pre: 3.05 L/sec
FEF2575-%Change-Post: 0 %
FEF2575-%Pred-Post: 149 %
FEF2575-%Pred-Pre: 148 %
FEV1-%Change-Post: 0 %
FEV1-%Pred-Post: 112 %
FEV1-%Pred-Pre: 112 %
FEV1-Post: 3.25 L
FEV1-Pre: 3.26 L
FEV1FVC-%Change-Post: 2 %
FEV1FVC-%Pred-Pre: 110 %
FEV6-%Change-Post: -2 %
FEV6-%Pred-Post: 104 %
FEV6-%Pred-Pre: 107 %
FEV6-Post: 3.94 L
FEV6-Pre: 4.05 L
FEV6FVC-%Change-Post: 0 %
FEV6FVC-%Pred-Post: 106 %
FEV6FVC-%Pred-Pre: 106 %
FVC-%Change-Post: -2 %
FVC-%Pred-Post: 98 %
FVC-%Pred-Pre: 101 %
FVC-Post: 3.97 L
FVC-Pre: 4.08 L
Post FEV1/FVC ratio: 82 %
Post FEV6/FVC ratio: 99 %
Pre FEV1/FVC ratio: 80 %
Pre FEV6/FVC Ratio: 99 %
RV % pred: 80 %
RV: 2.05 L
TLC % pred: 87 %
TLC: 6.07 L

## 2019-06-26 NOTE — Progress Notes (Signed)
PFT done today. 

## 2019-06-27 ENCOUNTER — Other Ambulatory Visit: Payer: Self-pay | Admitting: Internal Medicine

## 2019-06-27 ENCOUNTER — Ambulatory Visit (HOSPITAL_COMMUNITY)
Admission: RE | Admit: 2019-06-27 | Discharge: 2019-06-27 | Disposition: A | Discharge status: Home / Self Care | Payer: Medicare Other | Source: Ambulatory Visit | Attending: Cardiology | Admitting: Cardiology

## 2019-06-27 ENCOUNTER — Other Ambulatory Visit: Payer: Self-pay

## 2019-06-27 DIAGNOSIS — E041 Nontoxic single thyroid nodule: Secondary | ICD-10-CM

## 2019-06-27 DIAGNOSIS — I6521 Occlusion and stenosis of right carotid artery: Secondary | ICD-10-CM | POA: Diagnosis not present

## 2019-06-30 ENCOUNTER — Encounter: Payer: Self-pay | Admitting: Internal Medicine

## 2019-07-01 ENCOUNTER — Ambulatory Visit
Admission: RE | Admit: 2019-07-01 | Discharge: 2019-07-01 | Disposition: A | Discharge status: Home / Self Care | Payer: Medicare Other | Source: Ambulatory Visit | Attending: Internal Medicine | Admitting: Internal Medicine

## 2019-07-01 DIAGNOSIS — E041 Nontoxic single thyroid nodule: Secondary | ICD-10-CM

## 2019-07-01 DIAGNOSIS — E042 Nontoxic multinodular goiter: Secondary | ICD-10-CM | POA: Diagnosis not present

## 2019-07-07 ENCOUNTER — Other Ambulatory Visit: Payer: Self-pay | Admitting: Internal Medicine

## 2019-07-07 ENCOUNTER — Encounter: Payer: Self-pay | Admitting: Internal Medicine

## 2019-07-07 DIAGNOSIS — E041 Nontoxic single thyroid nodule: Secondary | ICD-10-CM

## 2019-07-09 ENCOUNTER — Ambulatory Visit
Admission: RE | Admit: 2019-07-09 | Discharge: 2019-07-09 | Disposition: A | Discharge status: Home / Self Care | Payer: Medicare Other | Source: Ambulatory Visit | Attending: Internal Medicine | Admitting: Internal Medicine

## 2019-07-09 ENCOUNTER — Other Ambulatory Visit: Payer: Self-pay

## 2019-07-09 ENCOUNTER — Ambulatory Visit (INDEPENDENT_AMBULATORY_CARE_PROVIDER_SITE_OTHER): Payer: Medicare Other | Admitting: Internal Medicine

## 2019-07-09 ENCOUNTER — Encounter: Payer: Self-pay | Admitting: Internal Medicine

## 2019-07-09 VITALS — BP 128/88 | HR 66 | Temp 98.2°F | Ht 70.0 in | Wt 192.0 lb

## 2019-07-09 DIAGNOSIS — R1011 Right upper quadrant pain: Secondary | ICD-10-CM

## 2019-07-09 DIAGNOSIS — R109 Unspecified abdominal pain: Secondary | ICD-10-CM | POA: Insufficient documentation

## 2019-07-09 DIAGNOSIS — Z23 Encounter for immunization: Secondary | ICD-10-CM

## 2019-07-09 LAB — POCT URINALYSIS DIPSTICK
Bilirubin, UA: NEGATIVE
Blood, UA: NEGATIVE
Glucose, UA: NEGATIVE
Ketones, UA: NEGATIVE
Leukocytes, UA: NEGATIVE
Nitrite, UA: NEGATIVE
Protein, UA: NEGATIVE
Spec Grav, UA: 1.015 (ref 1.010–1.025)
Urobilinogen, UA: 1 E.U./dL
pH, UA: 7 (ref 5.0–8.0)

## 2019-07-09 NOTE — Progress Notes (Signed)
   Subjective:   Patient ID: Troy Jenkins, male    DOB: Oct 09, 1941, 78 y.o.   MRN: [id number]  HPI The patient is a 78 YO man coming in for right flank pain. Started about 1-2 weeks ago but worse since 1-2 days ago. He had prior kidney stones in 1965 and this feels similar. He was golfing and that made it dramatically worse. Still hurting 3/10 now and feels like deep burning pain. Denies blood in urine or passing stone. Denies nausea or vomiting. Denies fevers or chills. Denies diarrhea or constipation or blood in stool. Is not sure if he is drinking enough fluids lately.   PMH, [facility], social history reviewed and updated  Review of Systems  Constitutional: Negative.   HENT: Negative.   Eyes: Negative.   Respiratory: Negative for cough, chest tightness and shortness of breath.   Cardiovascular: Negative for chest pain, palpitations and leg swelling.  Gastrointestinal: Positive for abdominal pain. Negative for abdominal distention, constipation, diarrhea, nausea and vomiting.  Genitourinary: Positive for flank pain. Negative for difficulty urinating, enuresis and hematuria.  Skin: Negative.   Neurological: Negative.   Psychiatric/Behavioral: Negative.     Objective:  Physical Exam Constitutional:      Appearance: He is well-developed.  HENT:     Head: Normocephalic and atraumatic.  Neck:     Musculoskeletal: Normal range of motion.  Cardiovascular:     Rate and Rhythm: Normal rate and regular rhythm.  Pulmonary:     Effort: Pulmonary effort is normal. No respiratory distress.     Breath sounds: Normal breath sounds. No wheezing or rales.  Abdominal:     General: Bowel sounds are normal. There is no distension.     Palpations: Abdomen is soft.     Tenderness: There is no abdominal tenderness. There is no rebound.     Comments: Right flank tenderness on exam, BS normal, no rebound or guarding  Skin:    General: Skin is warm and dry.  Neurological:     Mental Status: He is  alert and oriented to person, place, and time.     Coordination: Coordination normal.     Vitals:   07/09/19 0952  BP: 128/88  Pulse: 66  Temp: 98.2 F (36.8 C)  TempSrc: Oral  SpO2: 99%  Weight: 192 lb (87.1 kg)  Height: 5\' 10"  (1.778 m)   Visit time 25 minutes: greater than 50% of that time was spent in face to face counseling and coordination of care with the patient: counseled about nature of kidney stones and treatment and options for diagnosis and management  Assessment & Plan:  Flu shot given at visit

## 2019-07-09 NOTE — Patient Instructions (Signed)
We will get the scan done to see if you have kidney stones.

## 2019-07-09 NOTE — Assessment & Plan Note (Signed)
Prior stone disease and feels similar per patient. Ordered CT renal stone study to evaluate stat.

## 2019-07-11 ENCOUNTER — Other Ambulatory Visit: Payer: Medicare Other

## 2019-07-14 ENCOUNTER — Other Ambulatory Visit: Payer: Medicare Other

## 2019-07-24 ENCOUNTER — Encounter: Payer: Self-pay | Admitting: Internal Medicine

## 2019-08-12 ENCOUNTER — Ambulatory Visit
Admission: RE | Admit: 2019-08-12 | Discharge: 2019-08-12 | Disposition: A | Discharge status: Home / Self Care | Payer: Medicare Other | Source: Ambulatory Visit | Attending: Internal Medicine | Admitting: Internal Medicine

## 2019-08-12 ENCOUNTER — Other Ambulatory Visit (HOSPITAL_COMMUNITY)
Admission: RE | Admit: 2019-08-12 | Discharge: 2019-08-12 | Disposition: A | Discharge status: Home / Self Care | Payer: Medicare Other | Source: Ambulatory Visit | Attending: Internal Medicine | Admitting: Internal Medicine

## 2019-08-12 DIAGNOSIS — E041 Nontoxic single thyroid nodule: Secondary | ICD-10-CM

## 2019-08-14 LAB — CYTOLOGY - NON PAP

## 2019-08-25 NOTE — Progress Notes (Signed)
[facility] [address], [place] [address] Phone: [phone] Subjective:    I'm seeing this patient by the request  of:  Hoyt Koch, MD   CC: Low back pain  [id number]  Troy Jenkins is a 78 y.o. male coming in with complaint of back and neck pain. Patient complaining of neck and back pain. Patient states that he has been having neck pain on right side for months. In the past 3 months patient has had severe pain in right lower back. Does play golf and has increase in pain with golfing. Had CT scan for kidney stones at time of pain. Has been using IBU and Icy Hot. Has done some heavy lifting recently and has not been having pain. Patient just had biopsy of thyroid. Benign nodules on right side of neck.    Patient initially was told that there was a possibility for renal stone.  Sent for a CT scan.  CT scan was negative for renal stone but patient did have a degenerative disc bulge at L3-L4.  This was independently visualized by me.  Past Medical History:  Diagnosis Date  . Cancer [facility]    prostate  . GOUT 08/13/2007  . HYPERLIPIDEMIA 08/13/2007  . HYPERTENSION 08/13/2007  . Ligament tear 06/22/12   left wrist   . Skin cancer of arm 10/13   left arm; small skin cancer removed; low grade  . 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 COLONOSCOPY  07/2010  . HERNIA REPAIR  2010   Left   . PROSTATE SURGERY  12/2005  . TREATMENT FISTULA ANAL  1980   Social History   Socioeconomic History  . Marital status: Married    Spouse name: Not on file  . Number of children: 0  . Years of education: Not on file  . Highest education level: Not on file  Occupational History  . Occupation: retired  Scientific laboratory technician  . Financial resource strain: Not hard at all  . Food insecurity    Worry: Never true    Inability: Never true  . Transportation needs    Medical: No    Non-medical: No  Tobacco Use   . Smoking status: Former Smoker    Quit date: 05/31/1968    Years since quitting: 51.2  . Smokeless tobacco: Never Used  Substance and Sexual Activity  . Alcohol use: Yes    Alcohol/week: 0.0 standard drinks    Comment: occasionally  . Drug use: No  . Sexual activity: Not Currently  Lifestyle  . Physical activity    Days per week: 0 days    Minutes per session: 0 min  . Stress: Not at all  Relationships  . Social connections    Talks on phone: More than three times a week    Gets together: More than three times a week    Attends religious service: 1 to 4 times per year    Active member of club or organization: Yes    Attends meetings of clubs or organizations: More than 4 times per year    Relationship status: Married  Other Topics Concern  . Not on file  Social History Narrative   HSG, - textile degree. Married 1965. No children. Work - retired from [employer]. Manages Theatre manager. Advanced Care Planning - raised the issue for future discussion.   Allergies  Allergen Reactions  . Amoxicillin Itching  .  Doxycycline Itching  . Iodine     IV dye   Family History  Problem Relation Age of Onset  . Leukemia Mother   . Diabetes Father   . Stroke Father   . Cancer Maternal Grandmother   . Stroke Paternal Grandfather   . Colon cancer Neg Hx      Current Outpatient Medications (Cardiovascular):  .  lisinopril (PRINIVIL,ZESTRIL) 10 MG tablet, Take 1 tablet (10 mg total) by mouth daily. Marland Kitchen  lisinopril (PRINIVIL,ZESTRIL) 20 MG tablet, Take 1 tablet (20 mg total) by mouth daily. Marland Kitchen  lovastatin (MEVACOR) 20 MG tablet, Take 1 tablet (20 mg total) by mouth at bedtime.   Current Outpatient Medications (Analgesics):  .  aspirin 81 MG tablet, Take 81 mg by mouth daily.    Current Outpatient Medications (Hematological):  Marland Kitchen  Iron, Ferrous Sulfate, 325 (65 Fe) MG TABS,   Current Outpatient Medications (Other):  .  glucosamine-chondroitin 500-400 MG  tablet, Take 1 tablet by mouth 2 (two) times daily with a meal. .  Multiple Vitamin (MULTIVITAMIN) tablet, Take 1 tablet by mouth daily.   Marland Kitchen  gabapentin (NEURONTIN) 100 MG capsule, Take 2 capsules (200 mg total) by mouth at bedtime.    Past medical history, social, surgical and family history all reviewed in electronic medical record.  No pertanent information unless stated regarding to the chief complaint.   Review of Systems:  No headache, visual changes, nausea, vomiting, diarrhea, constipation, dizziness, abdominal pain, skin rash, fevers, chills, night sweats, weight loss, swollen lymph nodes, body aches, joint swelling,  chest pain, shortness of breath, mood changes.  Positive muscle aches  Objective  Blood pressure (!) 142/86, pulse 89, height 5\' 10"  (1.778 m), weight 192 lb (87.1 kg), SpO2 99 %.    General: No apparent distress alert and oriented x3 mood and affect normal, dressed appropriately.  HEENT: Pupils equal, extraocular movements intact  Respiratory: Patient's speak in full sentences and does not appear short of breath  Cardiovascular: No lower extremity edema, non tender, no erythema  Skin: Warm dry intact with no signs of infection or rash on extremities or on axial skeleton.  Abdomen: Soft nontender  Neuro: Cranial nerves II through XII are intact, neurovascularly intact in all extremities with 2+ DTRs and 2+ pulses.  Lymph: No lymphadenopathy of posterior or anterior cervical chain or axillae bilaterally.  Gait normal with good balance and coordination.  MSK:  tender with full range of motion and good stability and symmetric strength and tone of shoulders, elbows, wrist, hip, knee and ankles bilaterally.  Arthritic changes of multiple joints Low back exam loss of lordosis, some mild lumbar degenerative scoliosis.  Negative straight leg test bilaterally.  Tightness with Corky Sox test on the right compared to left.  5-5 strength of the lower extremities.  Deep tendon reflexes  intact  [id number]; 15 additional minutes spent for Therapeutic exercises as stated in above notes.  This included exercises focusing on stretching, strengthening, with significant focus on eccentric aspects.   Long term goals include an improvement in range of motion, strength, endurance as well as avoiding reinjury. Patient's frequency would include in 1-2 times a day, 3-5 times a week for a duration of 6-12 weeks.  Proper technique shown and discussed handout in great detail with ATC.  Low back exercises that included:  Pelvic tilt/bracing instruction to focus on control of the pelvic girdle and lower abdominal muscles  Glute strengthening exercises, focusing on proper firing of the glutes without engaging the low  back muscles Proper stretching techniques for maximum relief for the hamstrings, hip flexors, low back and some rotation where tolerated   questions were discussed and answered.     Impression and Recommendations:     This case required medical decision making of moderate complexity. The above documentation has been reviewed and is accurate and complete Lyndal Pulley, DO       Note: This dictation was prepared with Dragon dictation along with smaller phrase technology. Any transcriptional errors that result from this process are unintentional.

## 2019-08-26 ENCOUNTER — Encounter: Payer: Self-pay | Admitting: Family Medicine

## 2019-08-26 ENCOUNTER — Other Ambulatory Visit: Payer: Self-pay

## 2019-08-26 ENCOUNTER — Ambulatory Visit (INDEPENDENT_AMBULATORY_CARE_PROVIDER_SITE_OTHER): Payer: Medicare Other | Admitting: Family Medicine

## 2019-08-26 DIAGNOSIS — M545 Low back pain, unspecified: Secondary | ICD-10-CM | POA: Insufficient documentation

## 2019-08-26 DIAGNOSIS — G8929 Other chronic pain: Secondary | ICD-10-CM | POA: Diagnosis not present

## 2019-08-26 MED ORDER — GABAPENTIN 100 MG PO CAPS: 200.0000 mg | ORAL_CAPSULE | Freq: Every day | ORAL | 0 refills | Status: DC

## 2019-08-26 NOTE — Assessment & Plan Note (Signed)
Patient has pain in the lower back.  Seems to be in the paraspinal musculature.  Patient noted does have 5 out of 5 strength in lower extremity with no significant radicular symptoms.  Patient known does have radicular symptoms that seem to come to the lateral aspect of the gluteal area and does correspond to the L3-L4 possible disc protrusion causing L3 and L4 nerve root impingement right sided.  Patient will be started on gabapentin, discussed icing regimen and home exercises, topical anti-inflammatories.  Follow-up again in 4 to 5 weeks.  Worsening pain consider formal physical therapy and likely x-rays of the back

## 2019-08-26 NOTE — Patient Instructions (Addendum)
Good to see you.  Ice 20 minutes 2 times daily. Usually after activity and before bed. Exercises 3 times a week.  Gabapentin 200 mg at night  Turmeric 500mg daily  Tart cherry extract 1200mg at night Vitamin D 2000 IU daily  See me again in 5-6 weeks  

## 2019-09-15 ENCOUNTER — Other Ambulatory Visit: Payer: Self-pay | Admitting: Internal Medicine

## 2019-09-29 ENCOUNTER — Other Ambulatory Visit: Payer: Self-pay | Admitting: Internal Medicine

## 2019-09-30 ENCOUNTER — Other Ambulatory Visit: Payer: Self-pay | Admitting: Internal Medicine

## 2019-10-01 ENCOUNTER — Other Ambulatory Visit: Payer: Self-pay | Admitting: Internal Medicine

## 2019-11-11 ENCOUNTER — Encounter: Payer: Self-pay | Admitting: Internal Medicine

## 2019-11-11 ENCOUNTER — Ambulatory Visit: Payer: Medicare Other | Admitting: Internal Medicine

## 2019-11-11 ENCOUNTER — Other Ambulatory Visit: Payer: Self-pay

## 2019-11-11 VITALS — BP 127/84 | HR 72 | Temp 97.2°F | Ht 70.0 in | Wt 191.0 lb

## 2019-11-11 DIAGNOSIS — I10 Essential (primary) hypertension: Secondary | ICD-10-CM | POA: Diagnosis not present

## 2019-11-11 DIAGNOSIS — E782 Mixed hyperlipidemia: Secondary | ICD-10-CM

## 2019-11-11 DIAGNOSIS — I6521 Occlusion and stenosis of right carotid artery: Secondary | ICD-10-CM

## 2019-11-11 MED ORDER — LOVASTATIN 40 MG PO TABS: 40.0000 mg | ORAL_TABLET | Freq: Every day | ORAL | 3 refills | Status: DC

## 2019-11-11 NOTE — Patient Instructions (Signed)
Medication Instructions:  INCREASE LOVASTATIN TO 40MG  DAILY *If you need a refill on your cardiac medications before your next appointment, please call your pharmacy*  Lab Work: NONE NEEDED If you have labs (blood work) drawn today and your tests are completely normal, you will receive your results only by: Marland Kitchen MyChart Message (if you have MyChart) OR . A paper copy in the mail If you have any lab test that is abnormal or we need to change your treatment, we will call you to review the results.  Testing/Procedures: Repeat Carotid Dopplers in 2021  Follow-Up: At [facility], you and your health needs are our priority.  As part of our continuing mission to provide you with exceptional heart care, we have created designated Provider Care Teams.  These Care Teams include your primary Cardiologist (physician) and Advanced Practice Providers (APPs -  Physician Assistants and Nurse Practitioners) who all work together to provide you with the care you need, when you need it.  Your next appointment:   1 year(s)  The format for your next appointment:   In Person  Provider:   K. Mali Hilty, MD

## 2019-11-11 NOTE — Progress Notes (Signed)
OFFICE CONSULT NOTE  Chief Complaint:  Routine follow-up  Primary Care Physician: Hoyt Koch, MD  HPI:  Troy Jenkins is a 78 y.o. male who is being seen today for the evaluation of the above complaint at the request of Hoyt Koch, *. Mr. Vidal presents for evaluation of shortness of breath and fatigue. Past medical history significant for hypertension, dyslipidemia, and prostate cancer. He was seen recently by his primary care provider who noted the onset of fatigue 4-6 weeks prior with shortness of breath and intermittent lightheadedness. He apparently had had a tick bite and thought it might be related to that. He underwent workup for Lyme disease and [facility] spotted fever. There is no evidence of fever however he tested positive for the IgG. This suggest prior exposure but no active infection. He was initially to be treated with doxycycline but apparently that was discontinued. There is discussion about a possible infectious disease consultation but he does not have an appointment. He says his fatigue is persisted since about that time but is slowly gotten a little better. It short of breath with minimal exertion. He denies any chest pain.  09/18/2017  Mr. Meehan returns today for follow-up of his stress test.  This was an exercise Myoview performed on 08/23/2017.  This demonstrated normal LVEF 57% with a small fixed inferior inferoseptal perfusion defect suggestive of artifact.  Overall the study was low risk.  He reports he has had no further chest pain or worsening shortness of breath.  In fact he did a lot of recent work in the yard and I was asymptomatic with that.  He inquired today however about vascular screening.  His father died of stroke in his 55s and is interested in further risk assessment.  09/24/2018  Mr. Rinehimer is seen today for annual follow-up.  Overall he continues to have some shortness of breath with marked exertion.  He denies any  chest pain.  He said he is shortness of breath had improved after stress testing but still is an issue.  He is inquiring as to whether or not there may be some underlying pulmonary disease and wishes to follow-up with his PCP on this.  He was found to have moderate right carotid artery stenosis and a carotid bruit on vascular screen.  This will need to be repeated.  11/11/2019  Mr. Bartusek returns today for follow-up.  Overall is doing well without complaints.  Denies chest pain or worsening shortness of breath.  EKG personally reviewed today shows sinus rhythm at 72.  Blood pressure is initially elevated 160/96, however improved to 127/84.  Recent labs in February 2020 showed total cholesterol of 163, HDL 48, LDL 82 and triglycerides 165.  Hemoglobin A1c was 6.5.  His target LDL is less than 70 as he was found to have moderate to severe right internal carotid artery stenosis which was stable based on carotid Dopplers in August 2020.  PMHx:  Past Medical History:  Diagnosis Date  . Cancer [facility])    prostate  . GOUT 08/13/2007  . HYPERLIPIDEMIA 08/13/2007  . HYPERTENSION 08/13/2007  . Ligament tear 06/22/12   left wrist   . Skin cancer of arm 10/13   left arm; small skin cancer removed; low grade  . 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 COLONOSCOPY  07/2010  . HERNIA REPAIR  2010   Left   . PROSTATE SURGERY  12/2005  . TREATMENT  FISTULA ANAL  1980    FAMHx:  Family History  Problem Relation Age of Onset  . Leukemia Mother   . Diabetes Father   . Stroke Father   . Cancer Maternal Grandmother   . Stroke Paternal Grandfather   . Colon cancer Neg Hx     SOCHx:   reports that he quit smoking about 51 years ago. He has never used smokeless tobacco. He reports current alcohol use. He reports that he does not use drugs.  ALLERGIES:  Allergies  Allergen Reactions  . Amoxicillin Itching  . Doxycycline Itching  . Iodine     IV dye     ROS: Pertinent items noted in HPI and remainder of comprehensive ROS otherwise negative.  HOME MEDS: Current Outpatient Medications on File Prior to Visit  Medication Sig Dispense Refill  . aspirin 81 MG tablet Take 81 mg by mouth daily.      Marland Kitchen glucosamine-chondroitin 500-400 MG tablet Take 1 tablet by mouth 2 (two) times daily with a meal. 180 tablet 3  . Iron, Ferrous Sulfate, 325 (65 Fe) MG TABS     . lisinopril (ZESTRIL) 10 MG tablet TAKE 1 TABLET BY MOUTH  DAILY (ADDITION TO 20MG  FOR TOTAL 30MG  DAILY) 90 tablet 0  . lisinopril (ZESTRIL) 20 MG tablet TAKE 1 TABLET BY MOUTH  DAILY 90 tablet 0  . Multiple Vitamin (MULTIVITAMIN) tablet Take 1 tablet by mouth daily.       No current facility-administered medications on file prior to visit.    LABS/IMAGING: No results found for this or any previous visit (from the past 48 hour(s)). No results found.  LIPID PANEL:    Component Value Date/Time   CHOL 163 12/17/2018 1002   TRIG 165.0 (H) 12/17/2018 1002   HDL 48.60 12/17/2018 1002   CHOLHDL 3 12/17/2018 1002   VLDL 33.0 12/17/2018 1002   LDLCALC 82 12/17/2018 1002   LDLDIRECT 99.3 07/23/2008 1216    WEIGHTS: Wt Readings from Last 3 Encounters:  11/11/19 191 lb (86.6 kg)  08/26/19 192 lb (87.1 kg)  07/09/19 192 lb (87.1 kg)    VITALS: BP 127/84   Pulse 72   Temp (!) 97.2 F (36.2 C)   Ht 5\' 10"  (1.778 m)   Wt 191 lb (86.6 kg)   SpO2 100%   BMI 27.41 kg/m   EXAM: General appearance: alert and no distress Neck: no JVD, thyroid not enlarged, symmetric, no tenderness/mass/nodules and Right carotid bruit Lungs: clear to auscultation bilaterally Heart: regular rate and rhythm Abdomen: soft, non-tender; bowel sounds normal; no masses,  no organomegaly Extremities: extremities normal, atraumatic, no cyanosis or edema Pulses: 2+ and symmetric Skin: Skin color, texture, turgor normal. No rashes or lesions Neurologic: Grossly normal Psych: Pleasant  EKG: Sinus  rhythm at 72-personally reviewed  ASSESSMENT: 1. Moderate to severe right internal carotid artery stenosis 2. Dyspnea on exertion -low risk exercise Myoview with normal LV function (08/2017) 3. Fatigue 4. Hypertension 5. Dyslipidemia  PLAN: 1.   Mr. Brodeur has good blood pressure and cholesterol control.  He denies any worsening dyspnea or chest pain.  He was found to have moderate to severe right internal carotid artery stenosis by ultrasound in August 2020.  His target LDL is less than 70 and as he remains above target I would recommend increasing his lovastatin from 20 to 40 mg.  Repeat lipids in 3 months.  Follow-up annually or sooner as necessary with repeat carotid Dopplers.  Pixie Casino, MD, [facility],  [place] Director of the Advanced Lipid Disorders &  Cardiovascular Risk Reduction Clinic Diplomate of the American Board of Clinical Lipidology Attending Cardiologist  Direct Dial: [phone]  Fax: [phone]  Website:  [email or web address] 11/11/2019, 4:55 PM

## 2019-12-01 DIAGNOSIS — H524 Presbyopia: Secondary | ICD-10-CM | POA: Diagnosis not present

## 2019-12-01 DIAGNOSIS — H5203 Hypermetropia, bilateral: Secondary | ICD-10-CM | POA: Diagnosis not present

## 2019-12-01 DIAGNOSIS — H2513 Age-related nuclear cataract, bilateral: Secondary | ICD-10-CM | POA: Diagnosis not present

## 2019-12-01 DIAGNOSIS — H43813 Vitreous degeneration, bilateral: Secondary | ICD-10-CM | POA: Diagnosis not present

## 2019-12-01 DIAGNOSIS — H52223 Regular astigmatism, bilateral: Secondary | ICD-10-CM | POA: Diagnosis not present

## 2019-12-19 ENCOUNTER — Ambulatory Visit (INDEPENDENT_AMBULATORY_CARE_PROVIDER_SITE_OTHER): Payer: Medicare Other | Admitting: Internal Medicine

## 2019-12-19 ENCOUNTER — Encounter: Payer: Self-pay | Admitting: Internal Medicine

## 2019-12-19 ENCOUNTER — Other Ambulatory Visit: Payer: Self-pay

## 2019-12-19 VITALS — BP 134/86 | HR 72 | Temp 97.7°F | Ht 70.0 in | Wt 188.0 lb

## 2019-12-19 DIAGNOSIS — Z23 Encounter for immunization: Secondary | ICD-10-CM

## 2019-12-19 DIAGNOSIS — I10 Essential (primary) hypertension: Secondary | ICD-10-CM | POA: Diagnosis not present

## 2019-12-19 DIAGNOSIS — Z8546 Personal history of malignant neoplasm of prostate: Secondary | ICD-10-CM | POA: Diagnosis not present

## 2019-12-19 DIAGNOSIS — R7301 Impaired fasting glucose: Secondary | ICD-10-CM | POA: Diagnosis not present

## 2019-12-19 DIAGNOSIS — Z0000 Encounter for general adult medical examination without abnormal findings: Secondary | ICD-10-CM | POA: Diagnosis not present

## 2019-12-19 DIAGNOSIS — E782 Mixed hyperlipidemia: Secondary | ICD-10-CM

## 2019-12-19 LAB — CBC
HCT: 47 % (ref 39.0–52.0)
Hemoglobin: 15.8 g/dL (ref 13.0–17.0)
MCHC: 33.6 g/dL (ref 30.0–36.0)
MCV: 90.2 fl (ref 78.0–100.0)
Platelets: 203 10*3/uL (ref 150.0–400.0)
RBC: 5.21 Mil/uL (ref 4.22–5.81)
RDW: 13.2 % (ref 11.5–15.5)
WBC: 4.3 10*3/uL (ref 4.0–10.5)

## 2019-12-19 LAB — COMPREHENSIVE METABOLIC PANEL
ALT: 21 U/L (ref 0–53)
AST: 22 U/L (ref 0–37)
Albumin: 4.5 g/dL (ref 3.5–5.2)
Alkaline Phosphatase: 68 U/L (ref 39–117)
BUN: 17 mg/dL (ref 6–23)
CO2: 30 mEq/L (ref 19–32)
Calcium: 10.2 mg/dL (ref 8.4–10.5)
Chloride: 102 mEq/L (ref 96–112)
Creatinine, Ser: 1.06 mg/dL (ref 0.40–1.50)
GFR: 67.49 mL/min (ref >60.00)
Glucose, Bld: 116 mg/dL — ABNORMAL HIGH (ref 70–99)
Potassium: 4.6 mEq/L (ref 3.5–5.1)
Sodium: 140 mEq/L (ref 135–145)
Total Bilirubin: 0.6 mg/dL (ref 0.2–1.2)
Total Protein: 7.2 g/dL (ref 6.0–8.3)

## 2019-12-19 LAB — LIPID PANEL
Cholesterol: 155 mg/dL (ref 0–200)
HDL: 48.4 mg/dL (ref >39.00)
LDL Cholesterol: 70 mg/dL (ref 0–99)
NonHDL: 106.94
Total CHOL/HDL Ratio: 3
Triglycerides: 184 mg/dL — ABNORMAL HIGH (ref 0.0–149.0)
VLDL: 36.8 mg/dL (ref 0.0–40.0)

## 2019-12-19 LAB — PSA: PSA: 0 ng/mL — ABNORMAL LOW (ref 0.10–4.00)

## 2019-12-19 LAB — HEMOGLOBIN A1C: Hgb A1c MFr Bld: 5.8 % (ref 4.6–6.5)

## 2019-12-19 MED ORDER — LISINOPRIL 20 MG PO TABS: 20.0000 mg | ORAL_TABLET | Freq: Every day | ORAL | 3 refills | Status: DC

## 2019-12-19 MED ORDER — LOVASTATIN 40 MG PO TABS: 40.0000 mg | ORAL_TABLET | Freq: Every day | ORAL | 3 refills | Status: DC

## 2019-12-19 MED ORDER — LISINOPRIL 10 MG PO TABS: ORAL_TABLET | ORAL | 3 refills | Status: DC

## 2019-12-19 NOTE — Assessment & Plan Note (Signed)
Taking lisinopril 30 mg daily. Checking CMP and adjust as needed.

## 2019-12-19 NOTE — Assessment & Plan Note (Signed)
Flu shot up to date. Pneumonia complete. Shingrix complete. Tetanus given due 2031. Colonoscopy due 2022. Counseled about sun safety and mole surveillance. Counseled about the dangers of distracted driving. Given 10 year screening recommendations.

## 2019-12-19 NOTE — Assessment & Plan Note (Signed)
Checking lipid panel. Cardiology did increase lovastatin at visit in Dec 2020. Adjust as needed.

## 2019-12-19 NOTE — Assessment & Plan Note (Signed)
Checking PSA 

## 2019-12-19 NOTE — Patient Instructions (Signed)

## 2019-12-19 NOTE — Progress Notes (Signed)
Subjective:   Patient ID: Troy Jenkins, male    DOB: [birth year], 79 y.o.   MRN: [id number]  HPI Here for medicare wellness and physical, no new complaints. Please see A/P for status and treatment of chronic medical problems.   Diet: heart healthy  Physical activity: sedentary, plays golf spring and summer Depression/mood screen: negative Hearing: intact to whispered voice, mild loss bilaterally Visual acuity: grossly normal with lens, performs annual eye exam  ADLs: capable Fall risk: none Home safety: good Cognitive evaluation: intact to orientation, naming, recall and repetition EOL planning: adv directives discussed    Office Visit from 12/19/2019 in [place] at [facility] Total Score  0        Clinical Support from 12/14/2017 in [place]  PHQ-9 Total Score  0     I have personally reviewed and have noted 1. The patient's medical and social history - reviewed today no changes 2. Their use of alcohol, tobacco or illicit drugs 3. Their current medications and supplements 4. The patient's functional ability including ADL's, fall risks, home safety risks and hearing or visual impairment. 5. Diet and physical activities 6. Evidence for depression or mood disorders 7. Care team reviewed and updated  Patient Care Team: Hoyt Koch, MD as PCP - General (Internal Medicine) Debara Pickett Nadean Corwin, MD as PCP - Cardiology (Cardiology) Allyn Kenner, MD (Dermatology) Raynelle Bring, MD (Urology) Past Medical History:  Diagnosis Date  . Cancer [facility])    prostate  . GOUT 08/13/2007  . HYPERLIPIDEMIA 08/13/2007  . HYPERTENSION 08/13/2007  . Ligament tear 06/22/12   left wrist   . Skin cancer of arm 10/13   left arm; small skin cancer removed; low grade  . 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 COLONOSCOPY  07/2010  . HERNIA REPAIR  2010   Left   . PROSTATE SURGERY  12/2005  .  TREATMENT FISTULA ANAL  1980   Family History  Problem Relation Age of Onset  . Leukemia Mother   . Diabetes Father   . Stroke Father   . Cancer Maternal Grandmother   . Stroke Paternal Grandfather   . Colon cancer Neg Hx     Review of Systems  Constitutional: Negative.   HENT: Negative.   Eyes: Negative.   Respiratory: Negative for cough, chest tightness and shortness of breath.   Cardiovascular: Negative for chest pain, palpitations and leg swelling.  Gastrointestinal: Negative for abdominal distention, abdominal pain, constipation, diarrhea, nausea and vomiting.  Musculoskeletal: Negative.   Skin: Negative.   Neurological: Negative.   Psychiatric/Behavioral: Negative.     Objective:  Physical Exam Constitutional:      Appearance: He is well-developed.  HENT:     Head: Normocephalic and atraumatic.  Cardiovascular:     Rate and Rhythm: Normal rate and regular rhythm.  Pulmonary:     Effort: Pulmonary effort is normal. No respiratory distress.     Breath sounds: Normal breath sounds. No wheezing or rales.  Abdominal:     General: Bowel sounds are normal. There is no distension.     Palpations: Abdomen is soft.     Tenderness: There is no abdominal tenderness. There is no rebound.  Musculoskeletal:     Cervical back: Normal range of motion.  Skin:    General: Skin is warm and dry.  Neurological:     Mental Status: He is alert and oriented to person,  place, and time.     Coordination: Coordination normal.     Vitals:   12/19/19 0842  BP: 134/86  Pulse: 72  Temp: 97.7 F (36.5 C)  TempSrc: Oral  SpO2: 98%  Weight: 188 lb (85.3 kg)  Height: 5\' 10"  (1.778 m)    This visit occurred during the SARS-CoV-2 public health emergency.  Safety protocols were in place, including screening questions prior to the visit, additional usage of staff PPE, and extensive cleaning of exam room while observing appropriate contact time as indicated for disinfecting solutions.    Assessment & Plan:  Tdap given at visit

## 2020-01-08 ENCOUNTER — Ambulatory Visit: Payer: Medicare Other | Attending: Internal Medicine

## 2020-01-08 DIAGNOSIS — Z23 Encounter for immunization: Secondary | ICD-10-CM | POA: Insufficient documentation

## 2020-01-08 NOTE — Progress Notes (Signed)
   Covid-19 Vaccination Clinic  Name:  Troy Jenkins    MRN: [id number] DOB: July 06, 1941  01/08/2020  Troy Jenkins was observed post Covid-19 immunization for 15 minutes without incidence. He was provided with Vaccine Information Sheet and instruction to access the V-Safe system.   Troy Jenkins was instructed to call 911 with any severe reactions post vaccine: Marland Kitchen Difficulty breathing  . Swelling of your face and throat  . A fast heartbeat  . A bad rash all over your body  . Dizziness and weakness    Immunizations Administered    Name Date Dose VIS Date Route   Pfizer COVID-19 Vaccine 01/08/2020  9:12 AM 0.3 mL 10/24/2019 Intramuscular   Manufacturer: [place]   Lot: [id number]   [place]: [id number]

## 2020-01-28 ENCOUNTER — Ambulatory Visit: Payer: Medicare Other | Attending: Internal Medicine

## 2020-01-28 DIAGNOSIS — Z23 Encounter for immunization: Secondary | ICD-10-CM

## 2020-01-28 NOTE — Progress Notes (Signed)
   Covid-19 Vaccination Clinic  Name:  Troy Jenkins    MRN: [id number] DOB: 02-19-1941  01/28/2020  [name] was observed post Covid-19 immunization for 15 minutes without incident. He was provided with Vaccine Information Sheet and instruction to access the V-Safe system.   [name] was instructed to call 911 with any severe reactions post vaccine: Marland Kitchen Difficulty breathing  . Swelling of face and throat  . A fast heartbeat  . A bad rash all over body  . Dizziness and weakness   Immunizations Administered    Name Date Dose VIS Date Route   Pfizer COVID-19 Vaccine 01/28/2020 11:29 AM 0.3 mL 10/24/2019 Intramuscular   Manufacturer: [place]   Lot: [id number]   [place]: [id number]

## 2020-02-03 ENCOUNTER — Encounter: Payer: Self-pay | Admitting: Internal Medicine

## 2020-02-03 DIAGNOSIS — G8929 Other chronic pain: Secondary | ICD-10-CM

## 2020-02-03 NOTE — Telephone Encounter (Signed)
Called patient to schedule. He would prefer to be seen by an orthopedist for this issue.

## 2020-02-09 ENCOUNTER — Telehealth: Payer: Self-pay

## 2020-02-09 ENCOUNTER — Ambulatory Visit: Payer: Self-pay

## 2020-02-09 ENCOUNTER — Other Ambulatory Visit: Payer: Self-pay

## 2020-02-09 ENCOUNTER — Encounter: Payer: Self-pay | Admitting: Family Medicine

## 2020-02-09 ENCOUNTER — Ambulatory Visit: Payer: Medicare Other | Admitting: Family Medicine

## 2020-02-09 DIAGNOSIS — M545 Low back pain, unspecified: Secondary | ICD-10-CM

## 2020-02-09 NOTE — Addendum Note (Signed)
Addended by: Hortencia Pilar on: 02/09/2020 02:43 PM   Modules accepted: Orders

## 2020-02-09 NOTE — Progress Notes (Signed)
Troy Jenkins - 79 y.o. male MRN [id number]  Date of birth: [birth year]  Office Visit Note: Visit Date: 02/09/2020 PCP: Hoyt Koch, MD Referred by: Hoyt Koch, *  Subjective: Chief Complaint  Patient presents with  . Lower Back - Pain   HPI: Troy Jenkins is a 79 y.o. male who comes in today with intermittent right sided low back pack for the past several months. He reports that he has pain in his right lower back when he plays golf. Pain with back extension and rotation during his swing. It usually starts to hurt around the 6th hole and then persists until he is finished. No pain at rest, no pain at night. No numbness/tingling. He has tried ibuprofen which helps some. He has tried some stretching for his back also.  In October 2020, he saw Dr. Tamala Julian for low back pain that felt like a renal stone. He had lateral gluteal pain at the time which corresponded to L3-L4 disc protrusion.   ROS Otherwise per HPI.  Assessment & Plan: Visit Diagnoses:  1. Right-sided low back pain without sciatica, unspecified chronicity     Plan: Right sided low pain that appears to be from quadratus lumborum, with pain reproduced with palpation of muscle and several myofascial trigger points appreciated. Recommend PT and continued ibuprofen for pain. If pain does not improve in 4 weeks, will refer for MRI.  Meds & Orders: No orders of the defined types were placed in this encounter.   Orders Placed This Encounter  Procedures  . XR Lumbar Spine 2-3 Views    Follow-up: No follow-ups on file.   Procedures: No procedures performed  No notes on file   Clinical History: No specialty comments available.   He reports that he quit smoking about 51 years ago. He has never used smokeless tobacco.  Recent Labs    12/19/19 0911  HGBA1C 5.8    Objective:  VS:  HT:    WT:   BMI:     BP:   HR: bpm  TEMP: ( )  RESP:  Physical Exam  PHYSICAL EXAM: Gen: NAD, alert, cooperative  with exam, well-appearing HEENT: clear conjunctiva,  CV:  no edema, capillary refill brisk, normal rate Resp: non-labored Skin: no rashes, normal turgor  Neuro: no gross deficits.  Psych:  alert and oriented  Ortho Exam  Lumbar spine: - Inspection: no gross deformity or asymmetry, swelling or ecchymosis - Palpation: No TTP over the spinous processes, or SI joints b/l. TTP over right quadratus lumboris with myofascial trigger point appreciated - ROM: full active ROM of the lumbar spine in flexion and extension without pain - Strength: 5/5 strength of lower extremity in L4-S1 nerve root distributions b/l; normal gait - Neuro: sensation intact in the L4-S1 nerve root distribution b/l, 2+ L4 and S1 reflexes - Special testing: Negative straight leg raise  Imaging: Lumbar x-rays with mild spondylosis, facet joint arthritis.   Past Medical/Family/Surgical/Social History: Medications & Allergies reviewed per EMR, new medications updated. Patient Active Problem List   Diagnosis Date Noted  . Low back pain 08/26/2019  . Flank pain 07/09/2019  . SOB (shortness of breath) 08/10/2017  . H/O prostate cancer 11/27/2013  . Routine health maintenance 11/26/2011  . Hyperlipidemia 08/13/2007  . Gout 08/13/2007  . Hypertension 08/13/2007   Past Medical History:  Diagnosis Date  . Cancer [facility])    prostate  . GOUT 08/13/2007  . HYPERLIPIDEMIA 08/13/2007  . HYPERTENSION 08/13/2007  . Ligament  tear 06/22/12   left wrist   . Skin cancer of arm 10/13   left arm; small skin cancer removed; low grade  . VARIX, SCROTAL, LEFT 07/23/2008   Family History  Problem Relation Age of Onset  . Leukemia Mother   . Diabetes Father   . Stroke Father   . Cancer Maternal Grandmother   . Stroke Paternal Grandfather   . Colon cancer Neg Hx    Past Surgical History:  Procedure Laterality Date  . COLONOSCOPY  07/2010  . HERNIA REPAIR  2010   Left   . PROSTATE SURGERY  12/2005  . TREATMENT FISTULA ANAL  1980    Social History   Occupational History  . Occupation: retired  Tobacco Use  . Smoking status: Former Smoker    Quit date: 05/31/1968    Years since quitting: 51.7  . Smokeless tobacco: Never Used  Substance and Sexual Activity  . Alcohol use: Yes    Alcohol/week: 0.0 standard drinks    Comment: occasionally  . Drug use: No  . Sexual activity: Not Currently

## 2020-02-09 NOTE — Telephone Encounter (Signed)
completed

## 2020-02-09 NOTE — Telephone Encounter (Signed)
[facility] needs a copy of the office note and order faxed to them.   Fax # [phone]

## 2020-02-09 NOTE — Progress Notes (Signed)
Pt states pain is on the right side of upper lower back area. No leg pain.  Bothers him the most when playing golf

## 2020-02-09 NOTE — Progress Notes (Signed)
I saw and examined the patient with Dr. Mayer Masker and agree with assessment and plan as outlined.    Right-sided LBP for several months.  Initially felt like kidney stone, but CT was negative.  No radicular symptoms.  X-Rays show mild spondylosis, no sign of compression fx or neoplasm.  Exam reveals myofascial trigger points in right quadratus lumborum muscle.  Will try PT at [facility] in [place].  MRI lumbar spine if pain persists.

## 2020-02-11 DIAGNOSIS — M545 Low back pain: Secondary | ICD-10-CM | POA: Diagnosis not present

## 2020-02-13 DIAGNOSIS — M545 Low back pain: Secondary | ICD-10-CM | POA: Diagnosis not present

## 2020-02-16 DIAGNOSIS — M545 Low back pain: Secondary | ICD-10-CM | POA: Diagnosis not present

## 2020-02-18 DIAGNOSIS — M545 Low back pain: Secondary | ICD-10-CM | POA: Diagnosis not present

## 2020-02-23 DIAGNOSIS — M545 Low back pain: Secondary | ICD-10-CM | POA: Diagnosis not present

## 2020-02-25 DIAGNOSIS — M545 Low back pain: Secondary | ICD-10-CM | POA: Diagnosis not present

## 2020-03-01 DIAGNOSIS — M545 Low back pain: Secondary | ICD-10-CM | POA: Diagnosis not present

## 2020-03-03 DIAGNOSIS — M545 Low back pain: Secondary | ICD-10-CM | POA: Diagnosis not present

## 2020-04-29 DIAGNOSIS — Z1283 Encounter for screening for malignant neoplasm of skin: Secondary | ICD-10-CM | POA: Diagnosis not present

## 2020-04-29 DIAGNOSIS — Z85828 Personal history of other malignant neoplasm of skin: Secondary | ICD-10-CM | POA: Diagnosis not present

## 2020-04-29 DIAGNOSIS — Z08 Encounter for follow-up examination after completed treatment for malignant neoplasm: Secondary | ICD-10-CM | POA: Diagnosis not present

## 2020-04-29 DIAGNOSIS — D225 Melanocytic nevi of trunk: Secondary | ICD-10-CM | POA: Diagnosis not present

## 2020-04-29 DIAGNOSIS — D2272 Melanocytic nevi of left lower limb, including hip: Secondary | ICD-10-CM | POA: Diagnosis not present

## 2020-04-29 DIAGNOSIS — D485 Neoplasm of uncertain behavior of skin: Secondary | ICD-10-CM | POA: Diagnosis not present

## 2020-04-29 DIAGNOSIS — L570 Actinic keratosis: Secondary | ICD-10-CM | POA: Diagnosis not present

## 2020-05-20 DIAGNOSIS — D485 Neoplasm of uncertain behavior of skin: Secondary | ICD-10-CM | POA: Diagnosis not present

## 2020-05-20 DIAGNOSIS — L988 Other specified disorders of the skin and subcutaneous tissue: Secondary | ICD-10-CM | POA: Diagnosis not present

## 2020-06-14 DIAGNOSIS — L308 Other specified dermatitis: Secondary | ICD-10-CM | POA: Diagnosis not present

## 2020-06-14 DIAGNOSIS — L820 Inflamed seborrheic keratosis: Secondary | ICD-10-CM | POA: Diagnosis not present

## 2020-06-28 ENCOUNTER — Ambulatory Visit (HOSPITAL_COMMUNITY)
Admission: RE | Admit: 2020-06-28 | Discharge: 2020-06-28 | Disposition: A | Discharge status: Home / Self Care | Payer: Medicare Other | Source: Ambulatory Visit | Attending: Internal Medicine | Admitting: Internal Medicine

## 2020-06-28 ENCOUNTER — Other Ambulatory Visit: Payer: Self-pay | Admitting: Internal Medicine

## 2020-06-28 ENCOUNTER — Other Ambulatory Visit: Payer: Self-pay

## 2020-06-28 DIAGNOSIS — I6521 Occlusion and stenosis of right carotid artery: Secondary | ICD-10-CM | POA: Insufficient documentation

## 2020-06-30 ENCOUNTER — Telehealth: Payer: Self-pay | Admitting: Internal Medicine

## 2020-06-30 NOTE — Telephone Encounter (Signed)
Patient called the office asking about his carotid done 06/28/20

## 2020-06-30 NOTE — Telephone Encounter (Signed)
Patient called w/results  Troy Casino, MD  06/29/2020  6:02 PM EDT     Moderate right and mild left ICA stenosis. Repeat in 1 year.   Dr Lemmie Evens

## 2020-07-26 ENCOUNTER — Encounter: Payer: Self-pay | Admitting: Internal Medicine

## 2020-07-28 DIAGNOSIS — Z03818 Encounter for observation for suspected exposure to other biological agents ruled out: Secondary | ICD-10-CM | POA: Diagnosis not present

## 2020-11-04 ENCOUNTER — Encounter: Payer: Self-pay | Admitting: Family Medicine

## 2020-11-04 ENCOUNTER — Ambulatory Visit (INDEPENDENT_AMBULATORY_CARE_PROVIDER_SITE_OTHER): Payer: Medicare Other | Admitting: Family Medicine

## 2020-11-04 ENCOUNTER — Other Ambulatory Visit: Payer: Self-pay

## 2020-11-04 VITALS — Ht 70.0 in | Wt 184.0 lb

## 2020-11-04 DIAGNOSIS — M79672 Pain in left foot: Secondary | ICD-10-CM

## 2020-11-04 MED ORDER — COLCHICINE 0.6 MG PO CAPS: 1.0000 | ORAL_CAPSULE | Freq: Two times a day (BID) | ORAL | 3 refills | Status: DC | PRN

## 2020-11-04 MED ORDER — DICLOFENAC SODIUM 1 % EX GEL: 4.0000 g | Freq: Four times a day (QID) | CUTANEOUS | 6 refills | Status: DC | PRN

## 2020-11-04 NOTE — Progress Notes (Signed)
Office Visit Note   Patient: Troy Jenkins           Date of Birth: April 30, 1941           MRN: [id number] Visit Date: 11/04/2020 Requested by: Hoyt Koch, MD [address] [place],  [place] [address] PCP: Hoyt Koch, MD  Subjective: Chief Complaint  Patient presents with  . Left Heel - Pain    HPI: He is here with left heel pain.  Symptoms started about 3 or 4 weeks ago, he was walking on a golf course and started noticing some pain in the posterior heel.  He was able to finish his round but later on he developed severe pain similar to the type of pain he has felt with gout attacks in his great toe.  He never sought any warmth or redness.  He tried indomethacin but that did not help, so he took ibuprofen and his pain has improved.  He is no longer walking with a limp, but he still feels discomfort.  Incidentally, his back pain improved with physical therapy and he is still doing home exercises.                ROS:   All other systems were reviewed and are negative.  Objective: Vital Signs: Ht 5\' 10"  (1.778 m)   Wt 184 lb (83.5 kg)   BMI 26.40 kg/m   Physical Exam:  General:  Alert and oriented, in no acute distress. Pulm:  Breathing unlabored. Psy:  Normal mood, congruent affect. Skin: There is no warmth or erythema Left heel: He is tender primarily at the Achilles insertion on the calcaneus.  There is a little tenderness in the retrocalcaneal bursa area but no palpable swelling.  Good range of motion of the ankle, tight heel cords.  No pain with plantar flexion against resistance.    Imaging: No results found.  Assessment & Plan: 1.  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  Cannot completely rule out gout. -We will try Voltaren gel and colchicine, as well as heel cord stretches.  If symptoms worsen, then Medrol Dosepak.  If he fails to improve, x-rays and ultrasound imaging.     Procedures: No procedures performed         PMFS History: Patient Active Problem List   Diagnosis Date Noted  . Low back pain 08/26/2019  . Flank pain 07/09/2019  . SOB (shortness of breath) 08/10/2017  . H/O prostate cancer 11/27/2013  . Routine health maintenance 11/26/2011  . Hyperlipidemia 08/13/2007  . Gout 08/13/2007  . Hypertension 08/13/2007   Past Medical History:  Diagnosis Date  . Cancer [facility])    prostate  . GOUT 08/13/2007  . HYPERLIPIDEMIA 08/13/2007  . HYPERTENSION 08/13/2007  . Ligament tear 06/22/12   left wrist   . Skin cancer of arm 10/13   left arm; small skin cancer removed; low grade  . VARIX, SCROTAL, LEFT 07/23/2008    Family History  Problem Relation Age of Onset  . Leukemia Mother   . Diabetes Father   . Stroke Father   . Cancer Maternal Grandmother   . Stroke Paternal Grandfather   . Colon cancer Neg Hx     Past Surgical History:  Procedure Laterality Date  . COLONOSCOPY  07/2010  . HERNIA REPAIR  2010   Left   . PROSTATE SURGERY  12/2005  . TREATMENT FISTULA ANAL  1980   Social History   Occupational History  . Occupation:  retired  Tobacco Use  . Smoking status: Former Smoker    Quit date: 05/31/1968    Years since quitting: 52.4  . Smokeless tobacco: Never Used  Vaping Use  . Vaping Use: Never used  Substance and Sexual Activity  . Alcohol use: Yes    Alcohol/week: 0.0 standard drinks    Comment: occasionally  . Drug use: No  . Sexual activity: Not Currently

## 2020-11-10 ENCOUNTER — Other Ambulatory Visit: Payer: Self-pay | Admitting: Internal Medicine

## 2020-11-25 ENCOUNTER — Other Ambulatory Visit: Payer: Self-pay | Admitting: Internal Medicine

## 2020-12-06 ENCOUNTER — Encounter: Payer: Self-pay | Admitting: Family Medicine

## 2020-12-06 MED ORDER — TRIAMCINOLONE ACETONIDE 0.1 % EX CREA: 1.0000 "application " | TOPICAL_CREAM | Freq: Two times a day (BID) | CUTANEOUS | 3 refills | Status: AC | PRN

## 2020-12-13 ENCOUNTER — Encounter: Payer: Self-pay | Admitting: Internal Medicine

## 2020-12-13 ENCOUNTER — Ambulatory Visit: Payer: Medicare Other | Admitting: Internal Medicine

## 2020-12-13 ENCOUNTER — Other Ambulatory Visit: Payer: Self-pay

## 2020-12-13 VITALS — BP 148/82 | HR 88 | Ht 70.0 in | Wt 188.4 lb

## 2020-12-13 DIAGNOSIS — I10 Essential (primary) hypertension: Secondary | ICD-10-CM

## 2020-12-13 DIAGNOSIS — E782 Mixed hyperlipidemia: Secondary | ICD-10-CM

## 2020-12-13 DIAGNOSIS — I6521 Occlusion and stenosis of right carotid artery: Secondary | ICD-10-CM | POA: Diagnosis not present

## 2020-12-13 NOTE — Patient Instructions (Signed)

## 2020-12-13 NOTE — Progress Notes (Signed)
OFFICE CONSULT NOTE  Chief Complaint:  Routine follow-up  Primary Care Physician: Myrlene Broker, MD  HPI:  Troy Jenkins is a 80 y.o. male who is being seen today for the evaluation of the above complaint at the request of Myrlene Broker, *. Troy Jenkins presents for evaluation of shortness of breath and fatigue. Past medical history significant for hypertension, dyslipidemia, and prostate cancer. He was seen recently by his primary care provider who noted the onset of fatigue 4-6 weeks prior with shortness of breath and intermittent lightheadedness. He apparently had had a tick bite and thought it might be related to that. He underwent workup for Lyme disease and [facility] spotted fever. There is no evidence of fever however he tested positive for the IgG. This suggest prior exposure but no active infection. He was initially to be treated with doxycycline but apparently that was discontinued. There is discussion about a possible infectious disease consultation but he does not have an appointment. He says his fatigue is persisted since about that time but is slowly gotten a little better. It short of breath with minimal exertion. He denies any chest pain.  09/18/2017  Troy Jenkins returns today for follow-up of his stress test.  This was an exercise Myoview performed on 08/23/2017.  This demonstrated normal LVEF 57% with a small fixed inferior inferoseptal perfusion defect suggestive of artifact.  Overall the study was low risk.  He reports he has had no further chest pain or worsening shortness of breath.  In fact he did a lot of recent work in the yard and I was asymptomatic with that.  He inquired today however about vascular screening.  His father died of stroke in his 72s and is interested in further risk assessment.  09/24/2018  Troy Jenkins is seen today for annual follow-up.  Overall he continues to have some shortness of breath with marked exertion.  He denies any  chest pain.  He said he is shortness of breath had improved after stress testing but still is an issue.  He is inquiring as to whether or not there may be some underlying pulmonary disease and wishes to follow-up with his PCP on this.  He was found to have moderate right carotid artery stenosis and a carotid bruit on vascular screen.  This will need to be repeated.  11/11/2019  Troy Jenkins returns today for follow-up.  Overall is doing well without complaints.  Denies chest pain or worsening shortness of breath.  EKG personally reviewed today shows sinus rhythm at 72.  Blood pressure is initially elevated 160/96, however improved to 127/84.  Recent labs in February 2020 showed total cholesterol of 163, HDL 48, LDL 82 and triglycerides 165.  Hemoglobin A1c was 6.5.  His target LDL is less than 70 as he was found to have moderate to severe right internal carotid artery stenosis which was stable based on carotid Dopplers in August 2020.  12/13/2020  Troy Jenkins is seen today in follow-up.  He continues to do well.  He has no chest pain or shortness of breath.  He gets some occasional pain over the right trapezius.  EKG showed a sinus rhythm with sinus arrhythmia today at 79.  Blood pressure is mildly elevated today.  [facility] and all was within normal limits.  He has follow-up with his PCP in February and will have repeat labs.  PMHx:  Past Medical History:  Diagnosis Date  . Cancer [facility])    prostate  . GOUT 08/13/2007  . HYPERLIPIDEMIA 08/13/2007  . HYPERTENSION 08/13/2007  . Ligament tear 06/22/12   left wrist   . Skin cancer of arm 10/13   left arm; small skin cancer removed; low grade  . 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 COLONOSCOPY  07/2010  . HERNIA REPAIR  2010   Left   . PROSTATE SURGERY  12/2005  . TREATMENT FISTULA ANAL  1980    FAMHx:  Family History  Problem  Relation Age of Onset  . Leukemia Mother   . Diabetes Father   . Stroke Father   . Cancer Maternal Grandmother   . Stroke Paternal Grandfather   . Colon cancer Neg Hx     SOCHx:   reports that he quit smoking about 52 years ago. He has never used smokeless tobacco. He reports current alcohol use. He reports that he does not use drugs.  ALLERGIES:  Allergies  Allergen Reactions  . Amoxicillin Itching  . Doxycycline Itching  . Iodine     IV dye    ROS: Pertinent items noted in HPI and remainder of comprehensive ROS otherwise negative.  HOME MEDS: Current Outpatient Medications on File Prior to Visit  Medication Sig Dispense Refill  . aspirin 81 MG tablet Take 81 mg by mouth daily.    Marland Kitchen glucosamine-chondroitin 500-400 MG tablet Take 1 tablet by mouth 2 (two) times daily with a meal. 180 tablet 3  . Iron, Ferrous Sulfate, 325 (65 Fe) MG TABS     . lisinopril (ZESTRIL) 10 MG tablet TAKE 1 TABLET BY MOUTH  DAILY (ADDITION TO 20MG  FOR TOTAL 30MG  DAILY) 90 tablet 3  . lisinopril (ZESTRIL) 20 MG tablet TAKE 1 TABLET BY MOUTH  DAILY 90 tablet 3  . lovastatin (MEVACOR) 40 MG tablet TAKE 1 TABLET BY MOUTH AT  BEDTIME 90 tablet 0  . Multiple Vitamin (MULTIVITAMIN) tablet Take 1 tablet by mouth daily.    Teena Dunk Cherry 1200 MG CAPS Take by mouth.    . triamcinolone (KENALOG) 0.1 % Apply 1 application topically 2 (two) times daily as needed. 30 g 3  . Turmeric 500 MG CAPS Take by mouth.    . Colchicine (MITIGARE) 0.6 MG CAPS Take 1 capsule by mouth 2 (two) times daily as needed. (Patient not taking: Reported on 12/13/2020) 60 capsule 3  . diclofenac Sodium (VOLTAREN) 1 % GEL Apply 4 g topically 4 (four) times daily as needed. (Patient not taking: Reported on 12/13/2020) 500 g 6   No current facility-administered medications on file prior to visit.    LABS/IMAGING: No results found for this or any previous visit (from the past 48 hour(s)). No results found.  LIPID PANEL:    Component  Value Date/Time   CHOL 155 12/19/2019 0911   TRIG 184.0 (H) 12/19/2019 0911   HDL 48.40 12/19/2019 0911   CHOLHDL 3 12/19/2019 0911   VLDL 36.8 12/19/2019 0911   LDLCALC 70 12/19/2019 0911   LDLDIRECT 99.3 07/23/2008 1216    WEIGHTS: Wt Readings from Last 3 Encounters:  12/13/20 188 lb 6.4 oz (85.5 kg)  11/04/20 184 lb (83.5 kg)  12/19/19 188 lb (85.3 kg)    VITALS: BP (!) 148/82 (BP Location: Left Arm, Patient Position: Sitting)   Pulse 88   Ht 5\' 10"  (1.778 m)   Wt 188 lb 6.4 oz (85.5  kg)   SpO2 98%   BMI 27.03 kg/m   EXAM: General appearance: alert and no distress Neck: no JVD, thyroid not enlarged, symmetric, no tenderness/mass/nodules and Right carotid bruit Lungs: clear to auscultation bilaterally Heart: regular rate and rhythm Abdomen: soft, non-tender; bowel sounds normal; no masses,  no organomegaly Extremities: extremities normal, atraumatic, no cyanosis or edema Pulses: 2+ and symmetric Skin: Skin color, texture, turgor normal. No rashes or lesions Neurologic: Grossly normal Psych: Pleasant  EKG: Sinus rhythm with sinus arrhythmia at 88-personally reviewed  ASSESSMENT: 1. Moderate to severe right internal carotid artery stenosis 2. Dyspnea on exertion -low risk exercise Myoview with normal LV function (08/2017) 3. Fatigue 4. Hypertension 5. Dyslipidemia  PLAN: 1.   Troy Jenkins continues to do well denies any chest pain or shortness of breath.  Blood pressure is a little elevated today.  His lipids have been well controlled and he is having repeat labs through his PCP next month.  He had a low risk Myoview in 2018.  He does have some carotid artery stenosis which has been stable by Dopplers in August of last year.  Have repeat Dopplers in August 2022.  Follow-up annually or sooner as necessary.  Pixie Casino, MD, [facility], [place] Director of the Advanced Lipid Disorders &  Cardiovascular Risk Reduction  Clinic Diplomate of the American Board of Clinical Lipidology Attending Cardiologist  Direct Dial: [phone]  Fax: [phone]  Website:  [email or web address] 12/13/2020, 10:50 AM

## 2020-12-17 ENCOUNTER — Other Ambulatory Visit: Payer: Self-pay

## 2020-12-17 ENCOUNTER — Ambulatory Visit: Payer: Medicare Other | Admitting: Internal Medicine

## 2020-12-27 ENCOUNTER — Encounter: Payer: Self-pay | Admitting: Internal Medicine

## 2020-12-27 ENCOUNTER — Ambulatory Visit: Payer: Medicare Other | Admitting: Internal Medicine

## 2020-12-27 ENCOUNTER — Other Ambulatory Visit: Payer: Self-pay

## 2020-12-27 ENCOUNTER — Ambulatory Visit (INDEPENDENT_AMBULATORY_CARE_PROVIDER_SITE_OTHER): Payer: Medicare Other | Admitting: Internal Medicine

## 2020-12-27 VITALS — BP 132/90 | HR 76 | Temp 97.8°F | Resp 18 | Ht 70.0 in | Wt 186.8 lb

## 2020-12-27 DIAGNOSIS — E782 Mixed hyperlipidemia: Secondary | ICD-10-CM

## 2020-12-27 DIAGNOSIS — Z0000 Encounter for general adult medical examination without abnormal findings: Secondary | ICD-10-CM

## 2020-12-27 DIAGNOSIS — I10 Essential (primary) hypertension: Secondary | ICD-10-CM | POA: Diagnosis not present

## 2020-12-27 DIAGNOSIS — R7301 Impaired fasting glucose: Secondary | ICD-10-CM

## 2020-12-27 DIAGNOSIS — Z8546 Personal history of malignant neoplasm of prostate: Secondary | ICD-10-CM

## 2020-12-27 LAB — CBC
HCT: 45.6 % (ref 39.0–52.0)
Hemoglobin: 15.7 g/dL (ref 13.0–17.0)
MCHC: 34.4 g/dL (ref 30.0–36.0)
MCV: 89.4 fl (ref 78.0–100.0)
Platelets: 203 10*3/uL (ref 150.0–400.0)
RBC: 5.11 Mil/uL (ref 4.22–5.81)
RDW: 13 % (ref 11.5–15.5)
WBC: 4.7 10*3/uL (ref 4.0–10.5)

## 2020-12-27 LAB — COMPREHENSIVE METABOLIC PANEL
ALT: 21 U/L (ref 0–53)
AST: 25 U/L (ref 0–37)
Albumin: 4.6 g/dL (ref 3.5–5.2)
Alkaline Phosphatase: 60 U/L (ref 39–117)
BUN: 11 mg/dL (ref 6–23)
CO2: 28 mEq/L (ref 19–32)
Calcium: 10.1 mg/dL (ref 8.4–10.5)
Chloride: 104 mEq/L (ref 96–112)
Creatinine, Ser: 1.03 mg/dL (ref 0.40–1.50)
GFR: 69.11 mL/min (ref >60.00)
Glucose, Bld: 92 mg/dL (ref 70–99)
Potassium: 4.5 mEq/L (ref 3.5–5.1)
Sodium: 140 mEq/L (ref 135–145)
Total Bilirubin: 0.7 mg/dL (ref 0.2–1.2)
Total Protein: 7.4 g/dL (ref 6.0–8.3)

## 2020-12-27 LAB — LIPID PANEL
Cholesterol: 171 mg/dL (ref 0–200)
HDL: 54.3 mg/dL (ref >39.00)
LDL Cholesterol: 78 mg/dL (ref 0–99)
NonHDL: 116.34
Total CHOL/HDL Ratio: 3
Triglycerides: 193 mg/dL — ABNORMAL HIGH (ref 0.0–149.0)
VLDL: 38.6 mg/dL (ref 0.0–40.0)

## 2020-12-27 LAB — HEMOGLOBIN A1C: Hgb A1c MFr Bld: 5.8 % (ref 4.6–6.5)

## 2020-12-27 LAB — PSA: PSA: 0 ng/mL — ABNORMAL LOW (ref 0.10–4.00)

## 2020-12-27 NOTE — Progress Notes (Signed)
Subjective:   Patient ID: Troy Jenkins, male    DOB: 1941-02-04, 80 y.o.   MRN: [id number]  HPI Here for medicare wellness and physical, no new complaints. Please see A/P for status and treatment of chronic medical problems.   Diet: heart healthy Physical activity: sedentary Depression/mood screen: negative Hearing: intact to whispered voice Visual acuity: grossly normal with lens, performs annual eye exam  ADLs: capable Fall risk: none Home safety: good Cognitive evaluation: intact to orientation, naming, recall and repetition EOL planning: adv directives discussed  [place] Visit from 12/27/2020 in [place] at [facility]  PHQ-2 Total Score 0      [place] from 12/14/2017 in [facility]  PHQ-9 Total Score 0     I have personally reviewed and have noted 1. The patient's medical and social history - reviewed today no changes 2. Their use of alcohol, tobacco or illicit drugs 3. Their current medications and supplements 4. The patient's functional ability including ADL's, fall risks, home safety risks and hearing or visual impairment. 5. Diet and physical activities 6. Evidence for depression or mood disorders 7. Care team reviewed and updated  Patient Care Team: Hoyt Koch, MD as PCP - General (Internal Medicine) Debara Pickett Nadean Corwin, MD as PCP - Cardiology (Cardiology) Allyn Kenner, MD (Dermatology) Raynelle Bring, MD (Urology) Past Medical History:  Diagnosis Date  . Cancer [facility])    prostate  . GOUT 08/13/2007  . HYPERLIPIDEMIA 08/13/2007  . HYPERTENSION 08/13/2007  . Ligament tear 06/22/12   left wrist   . Skin cancer of arm 10/13   left arm; small skin cancer removed; low grade  . 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 COLONOSCOPY  07/2010  . HERNIA REPAIR  2010   Left   . PROSTATE SURGERY  12/2005  . TREATMENT FISTULA ANAL  1980    Family History  Problem Relation Age of Onset  . Leukemia Mother   . Diabetes Father   . Stroke Father   . Cancer Maternal Grandmother   . Stroke Paternal Grandfather   . Colon cancer Neg Hx    Review of Systems  Constitutional: Negative.   HENT: Negative.   Eyes: Negative.   Respiratory: Negative for cough, chest tightness and shortness of breath.   Cardiovascular: Negative for chest pain, palpitations and leg swelling.  Gastrointestinal: Negative for abdominal distention, abdominal pain, constipation, diarrhea, nausea and vomiting.  Musculoskeletal: Negative.   Skin: Negative.   Neurological: Negative.   Psychiatric/Behavioral: Negative.     Objective:  Physical Exam Constitutional:      Appearance: He is well-developed and well-nourished.  HENT:     Head: Normocephalic and atraumatic.  Eyes:     Extraocular Movements: EOM normal.  Cardiovascular:     Rate and Rhythm: Normal rate and regular rhythm.  Pulmonary:     Effort: Pulmonary effort is normal. No respiratory distress.     Breath sounds: Normal breath sounds. No wheezing or rales.  Abdominal:     General: Bowel sounds are normal. There is no distension.     Palpations: Abdomen is soft.     Tenderness: There is no abdominal tenderness. There is no rebound.  Musculoskeletal:        General: No edema.     Cervical back: Normal range of motion.  Skin:    General: Skin is warm and dry.  Neurological:  Mental Status: He is alert and oriented to person, place, and time.     Coordination: Coordination normal.  Psychiatric:        Mood and Affect: Mood and affect normal.     Vitals:   12/27/20 1018  BP: 132/90  Pulse: 76  Resp: 18  Temp: 97.8 F (36.6 C)  TempSrc: Oral  SpO2: 99%  Weight: 186 lb 12.8 oz (84.7 kg)  Height: 5\' 10"  (1.778 m)   This visit occurred during the SARS-CoV-2 public health emergency.  Safety protocols were in place, including screening questions prior to the visit,  additional usage of staff PPE, and extensive cleaning of exam room while observing appropriate contact time as indicated for disinfecting solutions.   Assessment & Plan:

## 2020-12-27 NOTE — Assessment & Plan Note (Signed)
Flu shot up to date. Covid-19 up to date including booster. Pneumonia complete. Shingrix counseled. Tetanus due 2031. Colonoscopy records indicate due this year but review of pathology may indicate no further needed will consult with GI. Counseled about sun safety and mole surveillance. Counseled about the dangers of distracted driving. Given 10 year screening recommendations.

## 2020-12-27 NOTE — Patient Instructions (Signed)

## 2020-12-27 NOTE — Assessment & Plan Note (Signed)
Checking lipid panel and adjust lovastatin 40 mg daily as needed. 

## 2020-12-27 NOTE — Assessment & Plan Note (Signed)
Checking PSA and adjust as needed. 

## 2020-12-27 NOTE — Assessment & Plan Note (Signed)
Lisinopril 30 mg daily and BP at goal. Checking CMP and adjust as needed.

## 2020-12-28 ENCOUNTER — Encounter: Payer: Self-pay | Admitting: Internal Medicine

## 2020-12-28 DIAGNOSIS — Z86010 Personal history of colonic polyps: Secondary | ICD-10-CM

## 2021-01-04 ENCOUNTER — Encounter: Payer: Self-pay | Admitting: Internal Medicine

## 2021-02-15 ENCOUNTER — Other Ambulatory Visit: Payer: Self-pay | Admitting: Internal Medicine

## 2021-03-07 ENCOUNTER — Ambulatory Visit (AMBULATORY_SURGERY_CENTER): Payer: Medicare Other

## 2021-03-07 ENCOUNTER — Other Ambulatory Visit: Payer: Self-pay

## 2021-03-07 VITALS — Ht 70.0 in | Wt 180.0 lb

## 2021-03-07 DIAGNOSIS — Z86010 Personal history of colonic polyps: Secondary | ICD-10-CM

## 2021-03-07 MED ORDER — PEG-KCL-NACL-NASULF-NA ASC-C 100 G PO SOLR: 1.0000 | Freq: Once | ORAL | 0 refills | Status: AC

## 2021-03-07 NOTE — Progress Notes (Signed)
Pre visit completed via phone call;  Patient verified name, DOB, and address; No egg or soy allergy known to patient  No issues with past sedation with any surgeries or procedures Patient denies ever being told they had issues or difficulty with intubation  No FH of Malignant Hyperthermia No diet pills per patient No home 02 use per patient  No blood thinners per patient  Pt denies issues with constipation  No A fib or A flutter  EMMI video via MyChart  COVID 19 guidelines implemented in PV today with Pt and RN   Pt is fully vaccinated for Covid x 2 + booster;  Coupon given to pt in PV today, Code to Pharmacy and NO PA's for preps discussed with pt in PV today  Discussed with pt there will be an out-of-pocket cost for prep and that varies from $0 to 70 +  dollars   Due to the COVID-19 pandemic we are asking patients to follow certain guidelines.  Pt aware of COVID protocols and [facility] guidelines    

## 2021-03-08 ENCOUNTER — Telehealth: Payer: Self-pay | Admitting: Internal Medicine

## 2021-03-08 DIAGNOSIS — Z86010 Personal history of colonic polyps: Secondary | ICD-10-CM

## 2021-03-08 MED ORDER — PEG-KCL-NACL-NASULF-NA ASC-C 100 G PO SOLR: 1.0000 | Freq: Once | ORAL | 0 refills | Status: AC

## 2021-03-08 NOTE — Telephone Encounter (Signed)
Inbound call from patient. He states he went to pick up his prep for the procedure but the pharmacy have no record of it. His pharmacy is [facility] in [place].

## 2021-03-08 NOTE — Telephone Encounter (Signed)
Resent Movi to pharmacy

## 2021-03-10 MED ORDER — SUPREP BOWEL PREP KIT 17.5-3.13-1.6 GM/177ML PO SOLN: 1.0000 | Freq: Once | ORAL | 0 refills | Status: AC

## 2021-03-10 NOTE — Telephone Encounter (Signed)
Spoke with pt and told him that his insurance company prefers [employer]- new RX sent to his [facility] and new instructions mailed to pt

## 2021-03-10 NOTE — Telephone Encounter (Signed)
[facility] called to advise the original script was received what they had advised the patient was that the insurance will not cover Movi only cover Gavalyte N Clenpiq or Suprep are preferred. Please advise.

## 2021-03-10 NOTE — Addendum Note (Signed)
Addended by: Randall Hiss B on: 03/10/2021 10:27 AM   Modules accepted: Orders

## 2021-03-18 ENCOUNTER — Encounter: Payer: Self-pay | Admitting: Internal Medicine

## 2021-03-21 ENCOUNTER — Ambulatory Visit (AMBULATORY_SURGERY_CENTER): Payer: Medicare Other | Admitting: Internal Medicine

## 2021-03-21 ENCOUNTER — Other Ambulatory Visit: Payer: Self-pay

## 2021-03-21 ENCOUNTER — Other Ambulatory Visit: Payer: Self-pay | Admitting: Internal Medicine

## 2021-03-21 ENCOUNTER — Encounter: Payer: Self-pay | Admitting: Internal Medicine

## 2021-03-21 VITALS — BP 120/66 | HR 58 | Temp 98.0°F | Resp 12 | Ht 70.0 in | Wt 180.0 lb

## 2021-03-21 DIAGNOSIS — D122 Benign neoplasm of ascending colon: Secondary | ICD-10-CM

## 2021-03-21 DIAGNOSIS — Z86010 Personal history of colonic polyps: Secondary | ICD-10-CM | POA: Diagnosis not present

## 2021-03-21 DIAGNOSIS — D120 Benign neoplasm of cecum: Secondary | ICD-10-CM | POA: Diagnosis not present

## 2021-03-21 MED ORDER — SODIUM CHLORIDE 0.9 % IV SOLN: 500.0000 mL | Freq: Once | INTRAVENOUS | Status: DC

## 2021-03-21 NOTE — Progress Notes (Signed)
Called to room to assist during endoscopic procedure.  Patient ID and intended procedure confirmed with present staff. Received instructions for my participation in the procedure from the performing physician.  

## 2021-03-21 NOTE — Progress Notes (Signed)
PT taken to PACU. Monitors in place. VSS. Report given to RN. 

## 2021-03-21 NOTE — Patient Instructions (Signed)
Please read handouts provided. Continue present medications. Await pathology results.   YOU HAD AN ENDOSCOPIC PROCEDURE TODAY AT THE [facility]:   Refer to the procedure report that was given to you for any specific questions about what was found during the examination.  If the procedure report does not answer your questions, please call your gastroenterologist to clarify.  If you requested that your care partner not be given the details of your procedure findings, then the procedure report has been included in a sealed envelope for you to review at your convenience later.  YOU SHOULD EXPECT: Some feelings of bloating in the abdomen. Passage of more gas than usual.  Walking can help get rid of the air that was put into your GI tract during the procedure and reduce the bloating. If you had a lower endoscopy (such as a colonoscopy or flexible sigmoidoscopy) you may notice spotting of blood in your stool or on the toilet paper. If you underwent a bowel prep for your procedure, you may not have a normal bowel movement for a few days.  Please Note:  You might notice some irritation and congestion in your nose or some drainage.  This is from the oxygen used during your procedure.  There is no need for concern and it should clear up in a day or so.  SYMPTOMS TO REPORT IMMEDIATELY:  Following lower endoscopy (colonoscopy or flexible sigmoidoscopy):  Excessive amounts of blood in the stool  Significant tenderness or worsening of abdominal pains  Swelling of the abdomen that is new, acute  Fever of 100F or higher   For urgent or emergent issues, a gastroenterologist can be reached at any hour by calling [phone]. Do not use MyChart messaging for urgent concerns.    DIET:  We do recommend a small meal at first, but then you may proceed to your regular diet.  Drink plenty of fluids but you should avoid alcoholic beverages for 24 hours.  ACTIVITY:  You should plan to take it easy  for the rest of today and you should NOT DRIVE or use heavy machinery until tomorrow (because of the sedation medicines used during the test).    FOLLOW UP: Our staff will call the number listed on your records 48-72 hours following your procedure to check on you and address any questions or concerns that you may have regarding the information given to you following your procedure. If we do not reach you, we will leave a message.  We will attempt to reach you two times.  During this call, we will ask if you have developed any symptoms of COVID 19. If you develop any symptoms (ie: fever, flu-like symptoms, shortness of breath, cough etc.) before then, please call [phone].  If you test positive for Covid 19 in the 2 weeks post procedure, please call and report this information to us.    If any biopsies were taken you will be contacted by phone or by letter within the next 1-3 weeks.  Please call us at [phone] if you have not heard about the biopsies in 3 weeks.    SIGNATURES/CONFIDENTIALITY: You and/or your care partner have signed paperwork which will be entered into your electronic medical record.  These signatures attest to the fact that that the information above on your After Visit Summary has been reviewed and is understood.  Full responsibility of the confidentiality of this discharge information lies with you and/or your care-partner.  

## 2021-03-21 NOTE — Progress Notes (Signed)
Pt's states no medical or surgical changes since previsit or office visit. 

## 2021-03-21 NOTE — Op Note (Signed)
[place] Patient Name: Troy Jenkins Procedure Date: 03/21/2021 10:01 AM MRN: [id number] Endoscopist: Docia Chuck. Henrene Pastor , MD Age: 80 Referring MD:  Date of Birth: February 21, 1941 Gender: Male Account #: [id number] Procedure:                Colonoscopy with cold snare polypectomy x 2 Indications:              High risk colon cancer surveillance: Personal                            history of multiple (3 or more) adenomas. Previous                            examinations 1996, 2000, 2011, 2017 Medicines:                Monitored Anesthesia Care Procedure:                Pre-Anesthesia Assessment:                           - Prior to the procedure, a History and Physical                            was performed, and patient medications and                            allergies were reviewed. The patient's tolerance of                            previous anesthesia was also reviewed. The risks                            and benefits of the procedure and the sedation                            options and risks were discussed with the patient.                            All questions were answered, and informed consent                            was obtained. Prior Anticoagulants: The patient has                            taken no previous anticoagulant or antiplatelet                            agents. ASA Grade Assessment: II - A patient with                            mild systemic disease. After reviewing the risks                            and benefits, the patient was deemed in  satisfactory condition to undergo the procedure.                           After obtaining informed consent, the colonoscope                            was passed under direct vision. Throughout the                            procedure, the patient's blood pressure, pulse, and                            oxygen saturations were monitored continuously. The                             Olympus CF-HQ190 [id number]) Colonoscope was                            introduced through the anus and advanced to the the                            cecum, identified by appendiceal orifice and                            ileocecal valve. The ileocecal valve, appendiceal                            orifice, and rectum were photographed. The quality                            of the bowel preparation was excellent. The                            colonoscopy was performed without difficulty. The                            patient tolerated the procedure well. The bowel                            preparation used was SUPREP via split dose                            instruction. Scope In: 10:11:31 AM Scope Out: 10:25:55 AM Scope Withdrawal Time: 0 hours 10 minutes 6 seconds  Total Procedure Duration: 0 hours 14 minutes 24 seconds  Findings:                 Two polyps were found in the ascending colon and                            cecum. The polyps were 1 to 2 mm in size. These                            polyps were removed with a cold snare. Resection  and retrieval were complete.                           Multiple diverticula were found in the left colon                            and right colon.                           Internal hemorrhoids were found during                            retroflexion. The hemorrhoids were moderate.                           The exam was otherwise without abnormality on                            direct and retroflexion views. Complications:            No immediate complications. Estimated blood loss:                            None. Estimated Blood Loss:     Estimated blood loss: none. Impression:               - Two 1 to 2 mm polyps in the ascending colon and                            in the cecum, removed with a cold snare. Resected                            and retrieved.                           - Diverticulosis in the  left colon and in the right                            colon.                           - Internal hemorrhoids.                           - The examination was otherwise normal on direct                            and retroflexion views. Recommendation:           - Repeat colonoscopy is not recommended for                            surveillance.                           - Patient has a contact number available for  emergencies. The signs and symptoms of potential                            delayed complications were discussed with the                            patient. Return to normal activities tomorrow.                            Written discharge instructions were provided to the                            patient.                           - Resume previous diet.                           - Continue present medications.                           - Await pathology results. Docia Chuck. Henrene Pastor, MD 03/21/2021 10:41:02 AM This report has been signed electronically.

## 2021-03-23 ENCOUNTER — Telehealth: Payer: Self-pay

## 2021-03-23 NOTE — Telephone Encounter (Signed)
Patient returned call. Stated he is feeling fine. No complaints.

## 2021-03-23 NOTE — Telephone Encounter (Signed)
Second post procedure follow up call, no answer 

## 2021-03-23 NOTE — Telephone Encounter (Signed)
First post procedure follow up call, no answer 

## 2021-03-25 ENCOUNTER — Ambulatory Visit: Payer: Self-pay

## 2021-03-25 ENCOUNTER — Ambulatory Visit (INDEPENDENT_AMBULATORY_CARE_PROVIDER_SITE_OTHER): Payer: Medicare Other | Admitting: Family Medicine

## 2021-03-25 ENCOUNTER — Other Ambulatory Visit: Payer: Self-pay

## 2021-03-25 DIAGNOSIS — M79672 Pain in left foot: Secondary | ICD-10-CM

## 2021-03-25 MED ORDER — METHYLPREDNISOLONE 4 MG PO TBPK: ORAL_TABLET | ORAL | 0 refills | Status: DC

## 2021-03-25 NOTE — Progress Notes (Signed)
Office Visit Note   Patient: Troy Jenkins           Date of Birth: 03-Jan-1941           MRN: [id number] Visit Date: 03/25/2021 Requested by: Hoyt Koch, MD [address] [place],  [place] [address] PCP: Hoyt Koch, MD  Subjective: Chief Complaint  Patient presents with  . Left Heel - Pain    Pain in left heel for a few weeks. Uses crutches when pain is at its worst. Ibuprofen and voltaren gel help relieve the pain some.     HPI: He is here with recurrent left heel pain.  After last visit his pain completely resolved, then about 2 weeks ago it started hurting again with no injury.  Pain was severe to the point that he used crutches for a few days.  He used a lot of ibuprofen, he tried colchicine and Voltaren gel.  The pain is improving but not gone completely.  He wanted to know if he might have a bone spur.              ROS:   All other systems were reviewed and are negative.  Objective: Vital Signs: There were no vitals taken for this visit.  Physical Exam:  General:  Alert and oriented, in no acute distress. Pulm:  Breathing unlabored. Psy:  Normal mood, congruent affect. Skin: No erythema or warmth Left heel: He is tender near the Achilles insertion on the calcaneus.  No pain with medial/lateral calcaneal squeeze.  No tenderness at the plantar fascia.  Good heel cord flexibility today.    Imaging: XR Os Calcis Left  Result Date: 03/25/2021 X-rays of the left heel reveal a traction spur at the Achilles insertion on the calcaneus.  No sign of stress fracture.   Assessment & Plan: 1.  Left insertional Achilles tendinopathy -Stretching, ibuprofen, Voltaren gel.  He plans to order some new shoes with good arch supports and heel padding.  Medrol Dosepak in case he has a significant flareup prior to his 4-day golf trip coming up in a couple weeks.     Procedures: No procedures performed        PMFS History: Patient Active Problem List    Diagnosis Date Noted  . Low back pain 08/26/2019  . H/O prostate cancer 11/27/2013  . Routine health maintenance 11/26/2011  . Hyperlipidemia 08/13/2007  . Gout 08/13/2007  . Hypertension 08/13/2007   Past Medical History:  Diagnosis Date  . Arthritis    generalized  . Cancer [facility]) 2007   prostate  . Cataract    not a surgical candidate at this time (03/07/2021)  . GOUT 08/13/2007  . HYPERLIPIDEMIA 08/13/2007   on meds  . HYPERTENSION 08/13/2007   on meds  . Ligament tear 06/22/12   left wrist   . Skin cancer of arm 10/13   left arm; small skin cancer removed; low grade  . VARIX, SCROTAL, LEFT 07/23/2008    Family History  Problem Relation Age of Onset  . Leukemia Mother   . Diabetes Father   . Stroke Father   . Cancer Maternal Grandmother   . Colon cancer Maternal Grandmother 43  . Colon polyps Maternal Grandmother 81  . Stroke Paternal Grandfather   . Breast cancer Sister 56  . Esophageal cancer Neg Hx   . Rectal cancer Neg Hx   . Stomach cancer Neg Hx     Past Surgical History:  Procedure Laterality Date  .  COLONOSCOPY  07/2010  . COLONOSCOPY  2017   JP-MAC-supre(exc)-SSP x3  . HERNIA REPAIR  2010   Left   . POLYPECTOMY  2017   SSP x3  . PROSTATE SURGERY  12/2005  . TREATMENT FISTULA ANAL  1980   Social History   Occupational History  . Occupation: retired  Tobacco Use  . Smoking status: Former Smoker    Quit date: 05/31/1968    Years since quitting: 52.8  . Smokeless tobacco: Never Used  Vaping Use  . Vaping Use: Never used  Substance and Sexual Activity  . Alcohol use: Yes    Alcohol/week: 0.0 standard drinks    Comment: 1 per month  . Drug use: No  . Sexual activity: Not Currently

## 2021-04-12 ENCOUNTER — Encounter: Payer: Self-pay | Admitting: Internal Medicine

## 2021-04-13 ENCOUNTER — Telehealth: Payer: Self-pay | Admitting: Internal Medicine

## 2021-04-13 NOTE — Telephone Encounter (Signed)
Called patient back again and got  Voice mail. Left message with the good results of his path. Asked him to call back again if any further questions.

## 2021-04-18 ENCOUNTER — Telehealth: Payer: Self-pay | Admitting: *Deleted

## 2021-04-18 NOTE — Telephone Encounter (Signed)
Entered in error

## 2021-04-18 NOTE — Telephone Encounter (Signed)
Letter regarding colonoscopy pathology sent on 04/13/21 ,will call pt later today to make sure he received letter.

## 2021-04-20 DIAGNOSIS — L821 Other seborrheic keratosis: Secondary | ICD-10-CM | POA: Diagnosis not present

## 2021-04-20 DIAGNOSIS — X32XXXD Exposure to sunlight, subsequent encounter: Secondary | ICD-10-CM | POA: Diagnosis not present

## 2021-04-20 DIAGNOSIS — D1801 Hemangioma of skin and subcutaneous tissue: Secondary | ICD-10-CM | POA: Diagnosis not present

## 2021-04-20 DIAGNOSIS — L570 Actinic keratosis: Secondary | ICD-10-CM | POA: Diagnosis not present

## 2021-04-20 DIAGNOSIS — D225 Melanocytic nevi of trunk: Secondary | ICD-10-CM | POA: Diagnosis not present

## 2021-04-20 DIAGNOSIS — L814 Other melanin hyperpigmentation: Secondary | ICD-10-CM | POA: Diagnosis not present

## 2021-06-28 ENCOUNTER — Other Ambulatory Visit (HOSPITAL_COMMUNITY): Payer: Self-pay | Admitting: Internal Medicine

## 2021-06-28 ENCOUNTER — Other Ambulatory Visit: Payer: Self-pay

## 2021-06-28 ENCOUNTER — Ambulatory Visit (HOSPITAL_COMMUNITY)
Admission: RE | Admit: 2021-06-28 | Discharge: 2021-06-28 | Disposition: A | Discharge status: Home / Self Care | Payer: Medicare Other | Source: Ambulatory Visit | Attending: Cardiology | Admitting: Cardiology

## 2021-06-28 DIAGNOSIS — I6523 Occlusion and stenosis of bilateral carotid arteries: Secondary | ICD-10-CM

## 2021-06-28 DIAGNOSIS — I6521 Occlusion and stenosis of right carotid artery: Secondary | ICD-10-CM | POA: Insufficient documentation

## 2021-10-13 ENCOUNTER — Other Ambulatory Visit: Payer: Self-pay | Admitting: Internal Medicine

## 2021-12-21 ENCOUNTER — Telehealth: Payer: Self-pay | Admitting: Internal Medicine

## 2021-12-21 NOTE — Telephone Encounter (Signed)
N/A unable to leave a message for patient to call back to schedule Medicare Annual Wellness Visit   Last AWV  12/27/21  Please schedule at anytime with [facility] if patient calls the office back.    40 Minutes appointment   Any questions, please call me at [phone]

## 2021-12-28 ENCOUNTER — Encounter: Payer: Self-pay | Admitting: Internal Medicine

## 2021-12-28 ENCOUNTER — Other Ambulatory Visit: Payer: Self-pay

## 2021-12-28 ENCOUNTER — Ambulatory Visit (INDEPENDENT_AMBULATORY_CARE_PROVIDER_SITE_OTHER): Payer: Medicare Other | Admitting: Internal Medicine

## 2021-12-28 VITALS — BP 128/80 | HR 65 | Resp 18 | Ht 70.0 in | Wt 180.2 lb

## 2021-12-28 DIAGNOSIS — Z85820 Personal history of malignant melanoma of skin: Secondary | ICD-10-CM

## 2021-12-28 DIAGNOSIS — I10 Essential (primary) hypertension: Secondary | ICD-10-CM | POA: Diagnosis not present

## 2021-12-28 DIAGNOSIS — Z0000 Encounter for general adult medical examination without abnormal findings: Secondary | ICD-10-CM

## 2021-12-28 DIAGNOSIS — M1A9XX0 Chronic gout, unspecified, without tophus (tophi): Secondary | ICD-10-CM

## 2021-12-28 DIAGNOSIS — E782 Mixed hyperlipidemia: Secondary | ICD-10-CM

## 2021-12-28 DIAGNOSIS — Z8546 Personal history of malignant neoplasm of prostate: Secondary | ICD-10-CM | POA: Diagnosis not present

## 2021-12-28 DIAGNOSIS — R1011 Right upper quadrant pain: Secondary | ICD-10-CM

## 2021-12-28 DIAGNOSIS — Z9889 Other specified postprocedural states: Secondary | ICD-10-CM | POA: Diagnosis not present

## 2021-12-28 LAB — CBC
HCT: 45.3 % (ref 39.0–52.0)
Hemoglobin: 15.2 g/dL (ref 13.0–17.0)
MCHC: 33.6 g/dL (ref 30.0–36.0)
MCV: 91 fl (ref 78.0–100.0)
Platelets: 193 10*3/uL (ref 150.0–400.0)
RBC: 4.98 Mil/uL (ref 4.22–5.81)
RDW: 13.1 % (ref 11.5–15.5)
WBC: 3.9 10*3/uL — ABNORMAL LOW (ref 4.0–10.5)

## 2021-12-28 LAB — COMPREHENSIVE METABOLIC PANEL
ALT: 19 U/L (ref 0–53)
AST: 23 U/L (ref 0–37)
Albumin: 4.7 g/dL (ref 3.5–5.2)
Alkaline Phosphatase: 59 U/L (ref 39–117)
BUN: 14 mg/dL (ref 6–23)
CO2: 28 mEq/L (ref 19–32)
Calcium: 10.2 mg/dL (ref 8.4–10.5)
Chloride: 103 mEq/L (ref 96–112)
Creatinine, Ser: 1.04 mg/dL (ref 0.40–1.50)
GFR: 67.83 mL/min (ref >60.00)
Glucose, Bld: 108 mg/dL — ABNORMAL HIGH (ref 70–99)
Potassium: 4.8 mEq/L (ref 3.5–5.1)
Sodium: 142 mEq/L (ref 135–145)
Total Bilirubin: 0.8 mg/dL (ref 0.2–1.2)
Total Protein: 7.9 g/dL (ref 6.0–8.3)

## 2021-12-28 LAB — LIPID PANEL
Cholesterol: 182 mg/dL (ref 0–200)
HDL: 54.8 mg/dL (ref >39.00)
LDL Cholesterol: 97 mg/dL (ref 0–99)
NonHDL: 127.28
Total CHOL/HDL Ratio: 3
Triglycerides: 153 mg/dL — ABNORMAL HIGH (ref 0.0–149.0)
VLDL: 30.6 mg/dL (ref 0.0–40.0)

## 2021-12-28 LAB — PSA: PSA: 0 ng/mL — ABNORMAL LOW (ref 0.10–4.00)

## 2021-12-28 LAB — URIC ACID: Uric Acid, Serum: 7.2 mg/dL (ref 4.0–7.8)

## 2021-12-28 LAB — HEMOGLOBIN A1C: Hgb A1c MFr Bld: 5.7 % (ref 4.6–6.5)

## 2021-12-28 NOTE — Progress Notes (Signed)
Subjective:   Patient ID: Troy Jenkins, male    DOB: 1941/05/27, 81 y.o.   MRN: [id number]  HPI Here for medicare wellness and physical, with new complaints. Please see A/P for status and treatment of chronic medical problems.   Diet: heart healthy Physical activity: sedentary, does golf some Depression/mood screen: negative Hearing: intact to whispered voice, mild tinnitus Visual acuity: grossly normal with lens, performs annual eye exam  ADLs: capable Fall risk: none Home safety: good Cognitive evaluation: intact to orientation, naming, recall and repetition EOL planning: adv directives discussed  [place] Visit from 12/28/2021 in [place] at [facility]  PHQ-2 Total Score 0       [place] from 12/14/2017 in [place]  PHQ-9 Total Score 0      Fall Risk 12/14/2017 12/17/2018 12/19/2019 12/27/2020 12/28/2021  Falls in the past year? No 0 0 0 0  Was there an injury with Fall? - - - 0 0  Fall Risk Category Calculator - - - 0 0  Fall Risk Category - - - Low Low    I have personally reviewed and have noted 1. The patient's medical and social history - reviewed today no changes 2. Their use of alcohol, tobacco or illicit drugs 3. Their current medications and supplements 4. The patient's functional ability including ADL's, fall risks, home safety risks and hearing or visual impairment. 5. Diet and physical activities 6. Evidence for depression or mood disorders 7. Care team reviewed and updated 8.  The patient is not on an opioid pain medication.  Patient Care Team: Hoyt Koch, MD as PCP - General (Internal Medicine) Debara Pickett Nadean Corwin, MD as PCP - Cardiology (Cardiology) Allyn Kenner, MD (Dermatology) Raynelle Bring, MD (Urology) Past Medical History:  Diagnosis Date   Arthritis    generalized   Cancer [facility]) 2007   prostate   Cataract    not a surgical candidate at this time (03/07/2021)    GOUT 08/13/2007   HYPERLIPIDEMIA 08/13/2007   on meds   HYPERTENSION 08/13/2007   on meds   Ligament tear 06/22/12   left wrist    Skin cancer of arm 10/13   left arm; small skin cancer removed; low grade   VARIX, SCROTAL, LEFT 07/23/2008   Past Surgical History:  Procedure Laterality Date   COLONOSCOPY  07/2010   COLONOSCOPY  2017   JP-MAC-supre(exc)-SSP x3   HERNIA REPAIR  2010   Left    POLYPECTOMY  2017   SSP x3   PROSTATE SURGERY  12/2005   TREATMENT FISTULA ANAL  1980   Family History  Problem Relation Age of Onset   Leukemia Mother    Diabetes Father    Stroke Father    Cancer Maternal Grandmother    Colon cancer Maternal Grandmother 74   Colon polyps Maternal Grandmother 81   Stroke Paternal Grandfather    Breast cancer Sister 62   Esophageal cancer Neg Hx    Rectal cancer Neg Hx    Stomach cancer Neg Hx    Review of Systems  Constitutional: Negative.   HENT: Negative.    Eyes: Negative.   Respiratory:  Negative for cough, chest tightness and shortness of breath.   Cardiovascular:  Negative for chest pain, palpitations and leg swelling.  Gastrointestinal:  Positive for abdominal pain. Negative for abdominal distention, constipation, diarrhea, nausea and vomiting.  Musculoskeletal: Negative.   Skin: Negative.   Neurological: Negative.   Psychiatric/Behavioral:  Negative.     Objective:  Physical Exam Constitutional:      Appearance: He is well-developed.  HENT:     Head: Normocephalic and atraumatic.  Cardiovascular:     Rate and Rhythm: Normal rate and regular rhythm.  Pulmonary:     Effort: Pulmonary effort is normal. No respiratory distress.     Breath sounds: Normal breath sounds. No wheezing or rales.  Abdominal:     General: Bowel sounds are normal. There is no distension.     Palpations: Abdomen is soft.     Tenderness: There is abdominal tenderness. There is no rebound.  Musculoskeletal:     Cervical back: Normal range of motion.  Skin:     General: Skin is warm and dry.  Neurological:     Mental Status: He is alert and oriented to person, place, and time.     Coordination: Coordination normal.    Vitals:   12/28/21 0801  BP: 128/80  Pulse: 65  Resp: 18  SpO2: 98%  Weight: 180 lb 3.2 oz (81.7 kg)  Height: _0  (1.778 m)   This visit occurred during the SARS-CoV-2 public health emergency.  Safety protocols were in place, including screening questions prior to the visit, additional usage of staff PPE, and extensive cleaning of exam room while observing appropriate contact time as indicated for disinfecting solutions.   Assessment & Plan:

## 2021-12-28 NOTE — Assessment & Plan Note (Signed)
Checking lipid panel anda adjust lovastatin 40 mg daily as needed.

## 2021-12-28 NOTE — Assessment & Plan Note (Signed)
Sees dermatology regularly and counseled about sun safety. No new lesions today.

## 2021-12-28 NOTE — Assessment & Plan Note (Signed)
Checking PSA and adjust as needed. 

## 2021-12-28 NOTE — Patient Instructions (Addendum)
The hydrocortisone is safe to use daily and you can also try vaseline.  We will get the ultrasound of the stomach.

## 2021-12-28 NOTE — Assessment & Plan Note (Signed)
BP at goal on lisinopril 30 mg daily. Checking CMP and adjust as needed.

## 2021-12-28 NOTE — Assessment & Plan Note (Signed)
Checking CMP and [place] RUQ to rule out gallstones.

## 2021-12-28 NOTE — Assessment & Plan Note (Signed)
Flu shot up to date. Covid-19 counseled about booster. Pneumonia complete. Shingrix complete. Tetanus due 2031. Colonoscopy aged out. Counseled about sun safety and mole surveillance sees derm regularly due to hx melanoma. Counseled about the dangers of distracted driving. Given 10 year screening recommendations.

## 2021-12-28 NOTE — Assessment & Plan Note (Signed)
Checking uric acid, no flare recently.

## 2021-12-30 ENCOUNTER — Ambulatory Visit
Admission: RE | Admit: 2021-12-30 | Discharge: 2021-12-30 | Disposition: A | Discharge status: Home / Self Care | Payer: Medicare Other | Source: Ambulatory Visit | Attending: Internal Medicine | Admitting: Internal Medicine

## 2021-12-30 DIAGNOSIS — K760 Fatty (change of) liver, not elsewhere classified: Secondary | ICD-10-CM | POA: Diagnosis not present

## 2021-12-30 DIAGNOSIS — R1011 Right upper quadrant pain: Secondary | ICD-10-CM | POA: Diagnosis not present

## 2022-01-18 ENCOUNTER — Other Ambulatory Visit: Payer: Self-pay | Admitting: Internal Medicine

## 2022-01-30 ENCOUNTER — Telehealth: Payer: Self-pay | Admitting: Internal Medicine

## 2022-01-30 NOTE — Telephone Encounter (Signed)
Patient calling in ? ?Patient says he is still having pains in his stomach.. says even though the [place] 7 testing showed normal ? ?Wants to know if provider recommends more testing or what needs to be done ? ?Please fu [phone] ?

## 2022-01-30 NOTE — Telephone Encounter (Signed)
Would recommend to try omeprazole otc daily for 1-2 weeks to see if this helps. ?

## 2022-02-02 NOTE — Telephone Encounter (Signed)
Called pt. LDVM at [phone] with Dr. Nathanial Millman recommendations. Office number was provided in case he has additional questions.  ?

## 2022-04-14 DIAGNOSIS — R109 Unspecified abdominal pain: Secondary | ICD-10-CM | POA: Diagnosis not present

## 2022-06-28 ENCOUNTER — Ambulatory Visit (HOSPITAL_COMMUNITY)
Admission: RE | Admit: 2022-06-28 | Discharge: 2022-06-28 | Disposition: A | Discharge status: Home / Self Care | Payer: Medicare Other | Source: Ambulatory Visit | Attending: Internal Medicine | Admitting: Internal Medicine

## 2022-06-28 DIAGNOSIS — I6523 Occlusion and stenosis of bilateral carotid arteries: Secondary | ICD-10-CM | POA: Insufficient documentation

## 2022-06-29 ENCOUNTER — Other Ambulatory Visit: Payer: Self-pay | Admitting: *Deleted

## 2022-06-29 DIAGNOSIS — I6523 Occlusion and stenosis of bilateral carotid arteries: Secondary | ICD-10-CM

## 2022-07-02 ENCOUNTER — Other Ambulatory Visit: Payer: Self-pay | Admitting: Internal Medicine

## 2022-08-24 ENCOUNTER — Encounter: Payer: Self-pay | Admitting: Internal Medicine

## 2022-08-29 ENCOUNTER — Encounter: Payer: Self-pay | Admitting: Internal Medicine

## 2022-08-29 ENCOUNTER — Ambulatory Visit (INDEPENDENT_AMBULATORY_CARE_PROVIDER_SITE_OTHER): Payer: Medicare Other | Admitting: Internal Medicine

## 2022-08-29 VITALS — BP 128/68 | HR 74 | Ht 70.0 in | Wt 178.0 lb

## 2022-08-29 DIAGNOSIS — K4090 Unilateral inguinal hernia, without obstruction or gangrene, not specified as recurrent: Secondary | ICD-10-CM | POA: Diagnosis not present

## 2022-08-29 NOTE — Progress Notes (Signed)
   Subjective:   Patient ID: Troy Jenkins, male    DOB: Apr 06, 1941, 81 y.o.   MRN: [id number]  HPI The patient is an 81 YO man coming in for inguinal hernia. Started over summer with lifting heavy object.  Review of Systems  Constitutional: Negative.   HENT: Negative.    Eyes: Negative.   Respiratory:  Negative for cough, chest tightness and shortness of breath.   Cardiovascular:  Negative for chest pain, palpitations and leg swelling.  Gastrointestinal:  Negative for abdominal distention, abdominal pain, constipation, diarrhea, nausea and vomiting.  Musculoskeletal: Negative.   Skin: Negative.   Neurological: Negative.   Psychiatric/Behavioral: Negative.      Objective:  Physical Exam Constitutional:      Appearance: He is well-developed.  HENT:     Head: Normocephalic and atraumatic.  Cardiovascular:     Rate and Rhythm: Normal rate and regular rhythm.  Pulmonary:     Effort: Pulmonary effort is normal. No respiratory distress.     Breath sounds: Normal breath sounds. No wheezing or rales.  Abdominal:     General: Bowel sounds are normal. There is no distension.     Palpations: Abdomen is soft.     Tenderness: There is no abdominal tenderness. There is no rebound.     Comments: Right groin tenderness with bulging with lifting  Musculoskeletal:     Cervical back: Normal range of motion.  Skin:    General: Skin is warm and dry.  Neurological:     Mental Status: He is alert and oriented to person, place, and time.     Coordination: Coordination normal.     Vitals:   08/29/22 1303  BP: 128/68  Pulse: 74  Weight: 178 lb (80.7 kg)  Height: '5\' 10"'$  (1.778 m)    Assessment & Plan:

## 2022-08-29 NOTE — Assessment & Plan Note (Signed)
Past robotic prostate removal and left inguinal hernia repair with mesh. Needs CT abdomen/pelvis for planning and then likely referral to general surgery.

## 2022-08-29 NOTE — Patient Instructions (Signed)
We will get a scan of the stomach and then get you in with the surgeon.

## 2022-09-03 ENCOUNTER — Encounter: Payer: Self-pay | Admitting: Internal Medicine

## 2022-09-05 NOTE — Telephone Encounter (Signed)
Please advise 

## 2022-09-07 NOTE — Telephone Encounter (Signed)
Routed to Dr. Sharlet Salina.

## 2022-10-04 ENCOUNTER — Ambulatory Visit
Admission: RE | Admit: 2022-10-04 | Discharge: 2022-10-04 | Disposition: A | Discharge status: Home / Self Care | Payer: Medicare Other | Source: Ambulatory Visit | Attending: Internal Medicine | Admitting: Internal Medicine

## 2022-10-04 DIAGNOSIS — R109 Unspecified abdominal pain: Secondary | ICD-10-CM | POA: Diagnosis not present

## 2022-10-04 DIAGNOSIS — K4090 Unilateral inguinal hernia, without obstruction or gangrene, not specified as recurrent: Secondary | ICD-10-CM

## 2022-10-09 ENCOUNTER — Telehealth: Payer: Self-pay | Admitting: Internal Medicine

## 2022-10-09 DIAGNOSIS — K4090 Unilateral inguinal hernia, without obstruction or gangrene, not specified as recurrent: Secondary | ICD-10-CM

## 2022-10-09 NOTE — Telephone Encounter (Signed)
Referral placed can we document this in result notes as well to close the loop?

## 2022-10-09 NOTE — Telephone Encounter (Signed)
Patient is requesting referral to a surgeon for his inguinal hernia. Call back number is [phone].

## 2022-10-17 ENCOUNTER — Encounter: Payer: Self-pay | Admitting: General Surgery

## 2022-10-17 ENCOUNTER — Ambulatory Visit: Payer: Medicare Other | Admitting: General Surgery

## 2022-10-17 VITALS — BP 186/80 | HR 76 | Temp 97.8°F | Resp 14 | Ht 70.0 in | Wt 181.0 lb

## 2022-10-17 DIAGNOSIS — K4090 Unilateral inguinal hernia, without obstruction or gangrene, not specified as recurrent: Secondary | ICD-10-CM

## 2022-10-17 NOTE — Patient Instructions (Signed)
Robotic Assisted Laparoscopic Inguinal Hernia Repair, Adult Laparoscopic inguinal hernia repair is a surgical procedure to repair a small, weak spot in the groin muscles that allows fat or intestines from inside the abdomen to bulge out (inguinal hernia). This procedure may be planned, or it may be an emergency procedure. During the procedure, tissue that has bulged out is moved back into place, and the opening in the groin muscles is repaired. This is done through three small incisions in the abdomen. A thin tube with a light and camera on the end (laparoscope) is used to help perform the procedure. Tell a health care provider about: Any allergies you have. All medicines you are taking, including vitamins, herbs, eye drops, creams, and over-the-counter medicines. Any problems you or family members have had with anesthetic medicines. Any blood disorders you have. Any surgeries you have had. Any medical conditions you have. Whether you are pregnant or may be pregnant. What are the risks? Generally, this is a safe procedure. However, problems may occur, including: Infection. Bleeding. Allergic reactions to medicines. Damage to nearby structures or organs. Testicle damage or long-term pain and swelling of the scrotum, in males. Inability to completely empty the bladder (urinary retention). Blood clots. A collection of fluid that builds up under the skin (seroma). The hernia coming back (recurrence). What happens before the procedure? Medicines Ask your health care provider about: Changing or stopping your regular medicines. This is especially important if you are taking diabetes medicines or blood thinners. Taking medicines such as aspirin and ibuprofen. These medicines can thin your blood. Do not take these medicines unless your health care provider tells you to take them. Taking over-the-counter medicines, vitamins, herbs, and supplements. General instructions Do not use any products that  contain nicotine or tobacco for at least 4 weeks before the procedure, if possible. These products include cigarettes, chewing tobacco, and vaping devices, such as e-cigarettes. If you need help quitting, ask your health care provider. Ask your health care provider: How your surgery site will be marked. What steps will be taken to help prevent infection. These steps may include: Removing hair at the surgery site. Washing skin with a germ-killing soap. Taking antibiotic medicine. Plan to have a responsible adult take you home from the hospital or clinic. Plan to have a responsible adult care for you for the time you are told after you leave the hospital or clinic. This is important. What happens during the procedure? An IV will be inserted into one of your veins. You will be given one or more of the following: A medicine to help you relax (sedative). A medicine to make you fall asleep (general anesthetic). Three small incisions will be made in your abdomen. Your abdomen will be inflated with carbon dioxide gas to make the surgical area easier to see. A laparoscope and surgical instruments will be inserted through the incisions. The laparoscope will send images of the inside of your abdomen to a monitor in the room. Tissue that is bulging through the hernia may be removed or moved back into place. The hernia opening will be closed with a sheet of surgical mesh. The surgical instruments and laparoscope will be removed. Your incisions will be closed with stitches (sutures) and adhesive strips. A bandage (dressing) will be placed over your incisions. The procedure may vary among health care providers and hospitals. What happens after the procedure? Your blood pressure, heart rate, breathing rate, and blood oxygen level will be monitored until you leave the hospital or  clinic. You will be given pain medicine as needed. You may continue to receive medicines and fluids through an IV. The IV will be  removed after you can drink fluids. You will be encouraged to get up and move around and to take deep breaths frequently. If you were given a sedative during the procedure, it can affect you for several hours. Do not drive or operate machinery until your health care provider says that it is safe. Summary Laparoscopic inguinal hernia repair is a surgical procedure to repair a small, weak spot in the groin muscles that allows fat or intestines from inside the abdomen to bulge out (inguinal hernia). This procedure is done through three small incisions in the abdomen. A thin tube with a light and camera on the end (laparoscope) is used to help perform the procedure. After the procedure, you will be encouraged to get up and move around and to take deep breaths frequently. This information is not intended to replace advice given to you by your health care provider. Make sure you discuss any questions you have with your health care provider. Document Revised: 06/29/2020 Document Reviewed: 06/29/2020 Elsevier Patient Education  [place].

## 2022-10-17 NOTE — Progress Notes (Unsigned)
[facility] History and Physical  Reason for Referral:*** Referring Physician: ***  Chief Complaint   New Patient (Initial Visit)     Troy Jenkins is a 81 y.o. male.  HPI: ***.  The *** started *** and has had a duration of ***.  It is associated with ***.  The *** is improved with ***, and is made worse with ***.    Quality*** Context***  Past Medical History:  Diagnosis Date   Arthritis    generalized   Cancer (HCC) 2007   prostate   Cataract    not a surgical candidate at this time (03/07/2021)   GOUT 08/13/2007   HYPERLIPIDEMIA 08/13/2007   on meds   HYPERTENSION 08/13/2007   on meds   Ligament tear 06/22/12   left wrist    Skin cancer of arm 10/13   left arm; small skin cancer removed; low grade   VARIX, SCROTAL, LEFT 07/23/2008    Past Surgical History:  Procedure Laterality Date   COLONOSCOPY  07/2010   COLONOSCOPY  2017   JP-MAC-supre(exc)-SSP x3   HERNIA REPAIR  2010   Left    POLYPECTOMY  2017   SSP x3   PROSTATE SURGERY  12/2005   TREATMENT FISTULA ANAL  1980    Family History  Problem Relation Age of Onset   Leukemia Mother    Diabetes Father    Stroke Father    Cancer Maternal Grandmother    Colon cancer Maternal Grandmother 20   Colon polyps Maternal Grandmother 63   Stroke Paternal Grandfather    Breast cancer Sister 39   Esophageal cancer Neg Hx    Rectal cancer Neg Hx    Stomach cancer Neg Hx     Social History   Tobacco Use   Smoking status: Former    Types: Cigarettes    Quit date: 05/31/1968    Years since quitting: 54.4   Smokeless tobacco: Never  Vaping Use   Vaping Use: Never used  Substance Use Topics   Alcohol use: Yes    Alcohol/week: 0.0 standard drinks of alcohol    Comment: 1 per month   Drug use: No    Medications: {medication reviewed/display:3041432} Allergies as of 10/17/2022       Reactions   Amoxicillin Itching   Doxycycline Itching   Iodine    IV dye        Medication List         Accurate as of October 17, 2022 10:57 AM. If you have any questions, ask your nurse or doctor.          aspirin 81 MG tablet Take 81 mg by mouth daily.   diclofenac Sodium 1 % Gel Commonly known as: VOLTAREN Apply 4 g topically 4 (four) times daily. Cream as needed   Iron (Ferrous Sulfate) 325 (65 Fe) MG Tabs Take 1 tablet by mouth daily.   lisinopril 20 MG tablet Commonly known as: ZESTRIL TAKE 1 TABLET BY MOUTH DAILY   lisinopril 10 MG tablet Commonly known as: ZESTRIL TAKE 1 TABLET BY MOUTH DAILY  (ADDITION TO 20MG  FOR TOTAL 30MG  DAILY)   lovastatin 40 MG tablet Commonly known as: MEVACOR TAKE 1 TABLET BY MOUTH AT  BEDTIME   multivitamin tablet Take 1 tablet by mouth daily.   TART CHERRY PO Take 1,000 mg by mouth daily at 6 (six) AM.   triamcinolone cream 0.1 % Commonly known as: KENALOG Apply 1 application topically 2 (two) times daily as  needed.   Turmeric 500 MG Caps Take 1 capsule by mouth daily at 6 (six) AM.         ROS:  {Review of Systems:30496}  Blood pressure (!) 186/80, pulse 76, temperature 97.8 F (36.6 C), temperature source Oral, resp. rate 14, height 5\' 10"  (1.778 m), weight 181 lb (82.1 kg), SpO2 99 %. Physical Exam  Results: No results found for this or any previous visit (from the past 48 hour(s)).  No results found.   Assessment & Plan:  [name] is a 81 y.o. male with *** -*** -*** -Follow up ***  All questions were answered to the satisfaction of the patient and family***.  The risk and benefits of *** were discussed including but not limited to ***.  After careful consideration, [name] has decided to ***.    Lucretia Roers 10/17/2022, 10:57 AM

## 2022-10-19 NOTE — H&P (Signed)
[facility] History and Physical  Reason for Referral: Right sided inguinal hernia  Referring Physician: Hoyt Koch, MD    Chief Complaint   New Patient (Initial Visit)     Troy Jenkins is a 81 y.o. male.  HPI: Troy Jenkins is a very sweet and healthy 81 yo. He has noticed a bulge in the right groin for several months now and says it is getting larger. He denies any obstructive type symptoms. He had an open left inguinal hernia repair back in 2010 and had a Robotic Prostatectomy in 2007. He is otherwise active and has minimal health issues for his age. He says that the area causes him the most discomfort when he is active or moving around.   Past Medical History:  Diagnosis Date   Arthritis    generalized   Cancer ([place]) 2007   prostate   Cataract    not a surgical candidate at this time (03/07/2021)   GOUT 08/13/2007   HYPERLIPIDEMIA 08/13/2007   on meds   HYPERTENSION 08/13/2007   on meds   Ligament tear 06/22/12   left wrist    Skin cancer of arm 10/13   left arm; small skin cancer removed; low grade   VARIX, SCROTAL, LEFT 07/23/2008    Past Surgical History:  Procedure Laterality Date   COLONOSCOPY  07/2010   COLONOSCOPY  2017   JP-MAC-supre(exc)-SSP x3   HERNIA REPAIR  2010   Left    POLYPECTOMY  2017   SSP x3   PROSTATE SURGERY  12/2005   TREATMENT FISTULA ANAL  1980    Family History  Problem Relation Age of Onset   Leukemia Mother    Diabetes Father    Stroke Father    Cancer Maternal Grandmother    Colon cancer Maternal Grandmother 67   Colon polyps Maternal Grandmother 81   Stroke Paternal Grandfather    Breast cancer Sister 80   Esophageal cancer Neg Hx    Rectal cancer Neg Hx    Stomach cancer Neg Hx     Social History   Tobacco Use   Smoking status: Former    Types: Cigarettes    Quit date: 05/31/1968    Years since quitting: 54.4   Smokeless tobacco: Never  Vaping Use   Vaping Use: Never used  Substance Use  Topics   Alcohol use: Yes    Alcohol/week: 0.0 standard drinks of alcohol    Comment: 1 per month   Drug use: No    Medications: I have reviewed the patient's current medications. Allergies as of 10/17/2022       Reactions   Amoxicillin Itching   Doxycycline Itching   Iodine    IV dye        Medication List        Accurate as of October 17, 2022 10:57 AM. If you have any questions, ask your nurse or doctor.          aspirin 81 MG tablet Take 81 mg by mouth daily.   diclofenac Sodium 1 % Gel Commonly known as: VOLTAREN Apply 4 g topically 4 (four) times daily. Cream as needed   Iron (Ferrous Sulfate) 325 (65 Fe) MG Tabs Take 1 tablet by mouth daily.   lisinopril 20 MG tablet Commonly known as: ZESTRIL TAKE 1 TABLET BY MOUTH DAILY   lisinopril 10 MG tablet Commonly known as: ZESTRIL TAKE 1 TABLET BY MOUTH DAILY  (ADDITION TO 20MG FOR TOTAL 30MG DAILY)  lovastatin 40 MG tablet Commonly known as: MEVACOR TAKE 1 TABLET BY MOUTH AT  BEDTIME   multivitamin tablet Take 1 tablet by mouth daily.   TART CHERRY PO Take 1,000 mg by mouth daily at 6 (six) AM.   triamcinolone cream 0.1 % Commonly known as: KENALOG Apply 1 application topically 2 (two) times daily as needed.   Turmeric 500 MG Caps Take 1 capsule by mouth daily at 6 (six) AM.         ROS:  A comprehensive review of systems was negative except for: Gastrointestinal: positive for right inguinal hernia pain  Blood pressure (!) 186/80, pulse 76, temperature 97.8 F (36.6 C), temperature source Oral, resp. rate 14, height _0  (1.778 m), weight 181 lb (82.1 kg), SpO2 99 %. Physical Exam Vitals reviewed.  Constitutional:      Appearance: Normal appearance.  HENT:     Head: Atraumatic.     Nose: Nose normal.     Mouth/Throat:     Mouth: Mucous membranes are moist.  Cardiovascular:     Rate and Rhythm: Normal rate and regular rhythm.  Pulmonary:     Effort: Pulmonary effort is normal.      Breath sounds: Normal breath sounds.  Abdominal:     General: There is no distension.     Palpations: Abdomen is soft.     Tenderness: There is no abdominal tenderness.     Hernia: A hernia is present. Hernia is present in the right inguinal area. There is no hernia in the left inguinal area.     Comments: Reducible hernia on the right, no obvious hernia on the left  Musculoskeletal:        General: Normal range of motion.     Cervical back: Normal range of motion.  Skin:    General: Skin is warm.  Neurological:     General: No focal deficit present.     Mental Status: He is alert and oriented to person, place, and time.  Psychiatric:        Mood and Affect: Mood normal.        Thought Content: Thought content normal.     Results: personally reviewed- loop of small bowel in the right inguinal hernia  CLINICAL DATA: Right lower quadrant abdominal pain. Inguinal hernia.  EXAM: CT ABDOMEN AND PELVIS WITHOUT CONTRAST  TECHNIQUE: Multidetector CT imaging of the abdomen and pelvis was performed following the standard protocol without IV contrast.  RADIATION DOSE REDUCTION: This exam was performed according to the departmental dose-optimization program which includes automated exposure control, adjustment of the mA and/or kV according to patient size and/or use of iterative reconstruction technique.  COMPARISON: CT abdomen pelvis dated 07/09/2019.  FINDINGS: Evaluation of this exam is limited in the absence of intravenous contrast.  Lower chest: The visualized lung bases are clear.  No intra-abdominal free air or free fluid.  Hepatobiliary: The liver is unremarkable. No biliary dilatation. The gallbladder is unremarkable.  Pancreas: Unremarkable. No pancreatic ductal dilatation or surrounding inflammatory changes.  Spleen: Normal in size without focal abnormality.  Adrenals/Urinary Tract: The adrenal glands are unremarkable. The kidneys, visualized ureters, and  urinary bladder appear unremarkable.  Stomach/Bowel: There is a moderate-sized right inguinal hernia containing fat and a loop of distal small bowel. No evidence of obstruction or active inflammation. There is sigmoid diverticulosis without active inflammatory changes. There is moderate stool throughout the colon. The appendix is normal.  Vascular/Lymphatic: Mild aortoiliac atherosclerotic disease. The IVC is unremarkable. No  portal venous gas. There is no adenopathy.  Reproductive: Prostatectomy.  Other: Small fat containing umbilical hernia.  Musculoskeletal: Degenerative changes. No acute osseous pathology.  IMPRESSION: 1. No acute intra-abdominal or pelvic pathology. 2. Moderate-sized right inguinal hernia containing fat and a loop of distal small bowel. No evidence of obstruction or active inflammation. Normal appendix. 3. Sigmoid diverticulosis. 4. Aortic Atherosclerosis (ICD10-I70.0).   Electronically Signed By: Anner Crete M.D. On: 10/06/2022 03:21    Assessment & Plan:  Troy Jenkins is a 81 y.o. male with a right inguinal hernia. No obvious recurrence on the left.  Overall he is having more symptoms and does have bowel in the hernia on his CT scan.   Discussed the risk and benefits including, bleeding, infection, use of mesh, risk of recurrence, risk of nerve damage causing numbness or changes in sensation, risk of damage to the cord structures. The patient understands the risk and benefits of repair with mesh, and has decided to proceed.  We also discussed open versus robotic assisted laparoscopic surgery and the use of mesh. We discussed that I do both robotic and open repairs with mesh, and that these are considered equivalent. We discussed reasons for opting for laparoscopic surgery including if a bilateral repair is needed or if a patient has a recurrence after an open repair. We discussed the option of watch and wait in men and discussed that in 5 years some  studies report that 40% of men have crossed over to needing a hernia repair because the hernia has become larger or symptomatic. We discussed that women are not appropriate candidate for watchful waiting due to the risk of femoral hernias.   Plan for a robotic assisted laparoscopic right inguinal hernia repair with mesh, possible open, if too much scar tissue from this prostate surgery, possible bilateral repair if recurrence noted.      All questions were answered to the satisfaction of the patient.  Curlene Labrum, MD [facility] [address] [place], [place] [address] [phone] (office)

## 2022-10-27 ENCOUNTER — Other Ambulatory Visit: Payer: Self-pay | Admitting: Internal Medicine

## 2022-11-08 NOTE — Patient Instructions (Addendum)
Troy Jenkins  11/08/2022     '@PREFPERIOPPHARMACY'$ @   Your procedure is scheduled on  11/15/2022.   Report to [facility] at  0600 A.M.   Call this number if you have problems the morning of surgery:  [phone]  If you experience any cold or flu symptoms such as cough, fever, chills, shortness of breath, etc. between now and your scheduled surgery, please notify us at the above number.   Remember:  Do not eat or drink after midnight.      Take these medicines the morning of surgery with A SIP OF WATER                                              None        Do not wear jewelry, make-up or nail polish.  Do not wear lotions, powders, or perfumes, or deodorant.  Do not shave 48 hours prior to surgery.  Men may shave face and neck.  Do not bring valuables to the hospital.  [facility] is not responsible for any belongings or valuables.  Contacts, dentures or bridgework may not be worn into surgery.  Leave your suitcase in the car.  After surgery it may be brought to your room.  For patients admitted to the hospital, discharge time will be determined by your treatment team.  Patients discharged the day of surgery will not be allowed to drive home and must have someone with them for 24 hours.    Special instructions:   DO NOT smoke tobacco or vape for 24 hours before your procedure.  Please read over the following fact sheets that you were given. Coughing and Deep Breathing, Blood Transfusion Information, Surgical Site Infection Prevention, Anesthesia Post-op Instructions, and Care and Recovery After Surgery      Laparoscopic Inguinal Hernia Repair, Adult, Care After The following information offers guidance on how to care for yourself after your procedure. Your health care provider may also give you more specific instructions. If you have problems or questions, contact your health care provider. What can I expect after the procedure? After the procedure, it  is common to have: Pain. Swelling and bruising around the incision area. Scrotal swelling, in males. Some fluid or blood draining from your incisions. Follow these instructions at home: Medicines Take over-the-counter and prescription medicines only as told by your health care provider. Ask your health care provider if the medicine prescribed to you: Requires you to avoid driving or using machinery. Can cause constipation. You may need to take these actions to prevent or treat constipation: Drink enough fluid to keep your urine pale yellow. Take over-the-counter or prescription medicines. Eat foods that are high in fiber, such as beans, whole grains, and fresh fruits and vegetables. Limit foods that are high in fat and processed sugars, such as fried or sweet foods. Incision care  Follow instructions from your health care provider about how to take care of your incisions. Make sure you: Wash your hands with soap and water for at least 20 seconds before and after you change your bandage (dressing). If soap and water are not available, use hand sanitizer. Change your dressing as told by your health care provider. Leave stitches (sutures), skin glue, or adhesive strips in place. These skin closures may need to stay in place for 2  weeks or longer. If adhesive strip edges start to loosen and curl up, you may trim the loose edges. Do not remove adhesive strips completely unless your health care provider tells you to do that. Check your incision area every day for signs of infection. Check for: More redness, swelling, or pain. More fluid or blood. Warmth. Pus or a bad smell. Wear loose, soft clothing while your incisions heal. Managing pain and swelling If directed, put ice on the painful or swollen areas. To do this: Put ice in a plastic bag. Place a towel between your skin and the bag. Leave the ice on for 20 minutes, 2-3 times a day. Remove the ice if your skin turns bright red. This is  very important. If you cannot feel pain, heat, or cold, you have a greater risk of damage to the area.  Activity Do not lift anything that is heavier than 10 lb (4.5 kg), or the limit that you are told, until your health care provider says that it is safe. Ask your health care provider what activities are safe for you. A lot of activity during the first week after surgery can increase pain and swelling. For 1 week after your procedure: Avoid activities that take a lot of effort, such as exercise or sports. You may walk and climb stairs as needed for daily activity, but avoid long walks or climbing stairs for exercise. General instructions If you were given a sedative during the procedure, it can affect you for several hours. Do not drive or operate machinery until your health care provider says that it is safe. Do not take baths, swim, or use a hot tub until your health care provider approves. Ask your health care provider if you may take showers. You may only be allowed to take sponge baths. Do not use any products that contain nicotine or tobacco. These products include cigarettes, chewing tobacco, and vaping devices, such as e-cigarettes. If you need help quitting, ask your health care provider. Keep all follow-up visits. This is important. Contact a health care provider if: You have any of these signs of infection: More redness, swelling, or pain around your incisions or your groin area. More fluid or blood coming from an incision. Warmth coming from an incision. Pus or a bad smell coming from an incision. A fever or chills. You have more swelling in your scrotum, if you are male. You have severe pain and medicines do not help. You have abdominal pain or swelling. You cannot urinate or have a bowel movement. You faint or feel dizzy. You have nausea and vomiting. Get help right away if: You have redness, warmth, or pain in your leg. You have chest pain. You have problems  breathing. These symptoms may represent a serious problem that is an emergency. Do not wait to see if the symptoms will go away. Get medical help right away. Call your local emergency services (911 in the [place] Do not drive yourself to the hospital. Summary Pain, swelling, and bruising are common after the procedure. Check your incision area every day for signs of infection, such as more redness, swelling, or pain. Put ice on painful or swollen areas for 20 minutes, 2-3 times a day. This information is not intended to replace advice given to you by your health care provider. Make sure you discuss any questions you have with your health care provider. Document Revised: 06/29/2020 Document Reviewed: 06/29/2020 Elsevier Patient Education  [place] Anesthesia, Adult, Care After The  following information offers guidance on how to care for yourself after your procedure. Your health care provider may also give you more specific instructions. If you have problems or questions, contact your health care provider. What can I expect after the procedure? After the procedure, it is common for people to: Have pain or discomfort at the IV site. Have nausea or vomiting. Have a sore throat or hoarseness. Have trouble concentrating. Feel cold or chills. Feel weak, sleepy, or tired (fatigue). Have soreness and body aches. These can affect parts of the body that were not involved in surgery. Follow these instructions at home: For the time period you were told by your health care provider:  Rest. Do not participate in activities where you could fall or become injured. Do not drive or use machinery. Do not drink alcohol. Do not take sleeping pills or medicines that cause drowsiness. Do not make important decisions or sign legal documents. Do not take care of children on your own. General instructions Drink enough fluid to keep your urine pale yellow. If you have sleep apnea, surgery and  certain medicines can increase your risk for breathing problems. Follow instructions from your health care provider about wearing your sleep device: Anytime you are sleeping, including during daytime naps. While taking prescription pain medicines, sleeping medicines, or medicines that make you drowsy. Return to your normal activities as told by your health care provider. Ask your health care provider what activities are safe for you. Take over-the-counter and prescription medicines only as told by your health care provider. Do not use any products that contain nicotine or tobacco. These products include cigarettes, chewing tobacco, and vaping devices, such as e-cigarettes. These can delay incision healing after surgery. If you need help quitting, ask your health care provider. Contact a health care provider if: You have nausea or vomiting that does not get better with medicine. You vomit every time you eat or drink. You have pain that does not get better with medicine. You cannot urinate or have bloody urine. You develop a skin rash. You have a fever. Get help right away if: You have trouble breathing. You have chest pain. You vomit blood. These symptoms may be an emergency. Get help right away. Call 911. Do not wait to see if the symptoms will go away. Do not drive yourself to the hospital. Summary After the procedure, it is common to have a sore throat, hoarseness, nausea, vomiting, or to feel weak, sleepy, or fatigue. For the time period you were told by your health care provider, do not drive or use machinery. Get help right away if you have difficulty breathing, have chest pain, or vomit blood. These symptoms may be an emergency. This information is not intended to replace advice given to you by your health care provider. Make sure you discuss any questions you have with your health care provider. Document Revised: 01/27/2022 Document Reviewed: 01/27/2022 Elsevier Patient Education   [place]. How to Use Chlorhexidine Before Surgery Chlorhexidine gluconate (CHG) is a germ-killing (antiseptic) solution that is used to clean the skin. It can get rid of the bacteria that normally live on the skin and can keep them away for about 24 hours. To clean your skin with CHG, you may be given: A CHG solution to use in the shower or as part of a sponge bath. A prepackaged cloth that contains CHG. Cleaning your skin with CHG may help lower the risk for infection: While you are staying in the intensive  care unit of the hospital. If you have a vascular access, such as a central line, to provide short-term or long-term access to your veins. If you have a catheter to drain urine from your bladder. If you are on a ventilator. A ventilator is a machine that helps you breathe by moving air in and out of your lungs. After surgery. What are the risks? Risks of using CHG include: A skin reaction. Hearing loss, if CHG gets in your ears and you have a perforated eardrum. Eye injury, if CHG gets in your eyes and is not rinsed out. The CHG product catching fire. Make sure that you avoid smoking and flames after applying CHG to your skin. Do not use CHG: If you have a chlorhexidine allergy or have previously reacted to chlorhexidine. On babies younger than 8 months of age. How to use CHG solution Use CHG only as told by your health care provider, and follow the instructions on the label. Use the full amount of CHG as directed. Usually, this is one bottle. During a shower Follow these steps when using CHG solution during a shower (unless your health care provider gives you different instructions): Start the shower. Use your normal soap and shampoo to wash your face and hair. Turn off the shower or move out of the shower stream. Pour the CHG onto a clean washcloth. Do not use any type of brush or rough-edged sponge. Starting at your neck, lather your body down to your toes. Make sure you  follow these instructions: If you will be having surgery, pay special attention to the part of your body where you will be having surgery. Scrub this area for at least 1 minute. Do not use CHG on your head or face. If the solution gets into your ears or eyes, rinse them well with water. Avoid your genital area. Avoid any areas of skin that have broken skin, cuts, or scrapes. Scrub your back and under your arms. Make sure to wash skin folds. Let the lather sit on your skin for 1-2 minutes or as long as told by your health care provider. Thoroughly rinse your entire body in the shower. Make sure that all body creases and crevices are rinsed well. Dry off with a clean towel. Do not put any substances on your body afterward--such as powder, lotion, or perfume--unless you are told to do so by your health care provider. Only use lotions that are recommended by the manufacturer. Put on clean clothes or pajamas. If it is the night before your surgery, sleep in clean sheets.  During a sponge bath Follow these steps when using CHG solution during a sponge bath (unless your health care provider gives you different instructions): Use your normal soap and shampoo to wash your face and hair. Pour the CHG onto a clean washcloth. Starting at your neck, lather your body down to your toes. Make sure you follow these instructions: If you will be having surgery, pay special attention to the part of your body where you will be having surgery. Scrub this area for at least 1 minute. Do not use CHG on your head or face. If the solution gets into your ears or eyes, rinse them well with water. Avoid your genital area. Avoid any areas of skin that have broken skin, cuts, or scrapes. Scrub your back and under your arms. Make sure to wash skin folds. Let the lather sit on your skin for 1-2 minutes or as long as told by your health  care provider. Using a different clean, wet washcloth, thoroughly rinse your entire body.  Make sure that all body creases and crevices are rinsed well. Dry off with a clean towel. Do not put any substances on your body afterward--such as powder, lotion, or perfume--unless you are told to do so by your health care provider. Only use lotions that are recommended by the manufacturer. Put on clean clothes or pajamas. If it is the night before your surgery, sleep in clean sheets. How to use CHG prepackaged cloths Only use CHG cloths as told by your health care provider, and follow the instructions on the label. Use the CHG cloth on clean, dry skin. Do not use the CHG cloth on your head or face unless your health care provider tells you to. When washing with the CHG cloth: Avoid your genital area. Avoid any areas of skin that have broken skin, cuts, or scrapes. Before surgery Follow these steps when using a CHG cloth to clean before surgery (unless your health care provider gives you different instructions): Using the CHG cloth, vigorously scrub the part of your body where you will be having surgery. Scrub using a back-and-forth motion for 3 minutes. The area on your body should be completely wet with CHG when you are done scrubbing. Do not rinse. Discard the cloth and let the area air-dry. Do not put any substances on the area afterward, such as powder, lotion, or perfume. Put on clean clothes or pajamas. If it is the night before your surgery, sleep in clean sheets.  For general bathing Follow these steps when using CHG cloths for general bathing (unless your health care provider gives you different instructions). Use a separate CHG cloth for each area of your body. Make sure you wash between any folds of skin and between your fingers and toes. Wash your body in the following order, switching to a new cloth after each step: The front of your neck, shoulders, and chest. Both of your arms, under your arms, and your hands. Your stomach and groin area, avoiding the genitals. Your right  leg and foot. Your left leg and foot. The back of your neck, your back, and your buttocks. Do not rinse. Discard the cloth and let the area air-dry. Do not put any substances on your body afterward--such as powder, lotion, or perfume--unless you are told to do so by your health care provider. Only use lotions that are recommended by the manufacturer. Put on clean clothes or pajamas. Contact a health care provider if: Your skin gets irritated after scrubbing. You have questions about using your solution or cloth. You swallow any chlorhexidine. Call your local poison control center (1-(503) 306-2841 in the [place] Get help right away if: Your eyes itch badly, or they become very red or swollen. Your skin itches badly and is red or swollen. Your hearing changes. You have trouble seeing. You have swelling or tingling in your mouth or throat. You have trouble breathing. These symptoms may represent a serious problem that is an emergency. Do not wait to see if the symptoms will go away. Get medical help right away. Call your local emergency services (911 in the [place] Do not drive yourself to the hospital. Summary Chlorhexidine gluconate (CHG) is a germ-killing (antiseptic) solution that is used to clean the skin. Cleaning your skin with CHG may help to lower your risk for infection. You may be given CHG to use for bathing. It may be in a bottle or in a prepackaged cloth to  use on your skin. Carefully follow your health care provider's instructions and the instructions on the product label. Do not use CHG if you have a chlorhexidine allergy. Contact your health care provider if your skin gets irritated after scrubbing. This information is not intended to replace advice given to you by your health care provider. Make sure you discuss any questions you have with your health care provider. Document Revised: 02/27/2022 Document Reviewed: 01/10/2021 Elsevier Patient Education  [place]

## 2022-11-10 ENCOUNTER — Encounter (HOSPITAL_COMMUNITY)
Admission: RE | Admit: 2022-11-10 | Discharge: 2022-11-10 | Disposition: A | Discharge status: Home / Self Care | Payer: Medicare Other | Source: Ambulatory Visit | Attending: General Surgery | Admitting: General Surgery

## 2022-11-10 ENCOUNTER — Encounter (HOSPITAL_COMMUNITY): Payer: Self-pay

## 2022-11-10 VITALS — BP 173/84 | HR 64 | Resp 20 | Ht 70.0 in | Wt 177.0 lb

## 2022-11-10 DIAGNOSIS — Z01818 Encounter for other preprocedural examination: Secondary | ICD-10-CM | POA: Insufficient documentation

## 2022-11-10 DIAGNOSIS — D649 Anemia, unspecified: Secondary | ICD-10-CM | POA: Insufficient documentation

## 2022-11-10 DIAGNOSIS — I10 Essential (primary) hypertension: Secondary | ICD-10-CM | POA: Diagnosis not present

## 2022-11-10 LAB — CBC WITH DIFFERENTIAL/PLATELET
Abs Immature Granulocytes: 0.01 10*3/uL (ref 0.00–0.07)
Basophils Absolute: 0 10*3/uL (ref 0.0–0.1)
Basophils Relative: 1 %
Eosinophils Absolute: 0.1 10*3/uL (ref 0.0–0.5)
Eosinophils Relative: 3 %
HCT: 43.9 % (ref 39.0–52.0)
Hemoglobin: 15.3 g/dL (ref 13.0–17.0)
Immature Granulocytes: 0 %
Lymphocytes Relative: 29 %
Lymphs Abs: 1.3 10*3/uL (ref 0.7–4.0)
MCH: 31.2 pg (ref 26.0–34.0)
MCHC: 34.9 g/dL (ref 30.0–36.0)
MCV: 89.4 fL (ref 80.0–100.0)
Monocytes Absolute: 0.4 10*3/uL (ref 0.1–1.0)
Monocytes Relative: 10 %
Neutro Abs: 2.5 10*3/uL (ref 1.7–7.7)
Neutrophils Relative %: 57 %
Platelets: 206 10*3/uL (ref 150–400)
RBC: 4.91 MIL/uL (ref 4.22–5.81)
RDW: 12.4 % (ref 11.5–15.5)
WBC: 4.3 10*3/uL (ref 4.0–10.5)
nRBC: 0 % (ref 0.0–0.2)

## 2022-11-10 LAB — TYPE AND SCREEN
ABO/RH(D): B POS
Antibody Screen: NEGATIVE

## 2022-11-14 NOTE — Anesthesia Preprocedure Evaluation (Signed)
Anesthesia Evaluation  Patient identified by MRN, date of birth, ID band Patient awake    Reviewed: Allergy & Precautions, NPO status , Patient's Chart, lab work & pertinent test results  Airway Mallampati: II  TM Distance: >3 FB     Dental  (+) Teeth Intact, Dental Advisory Given   Pulmonary former smoker   breath sounds clear to auscultation       Cardiovascular hypertension,      Neuro/Psych  negative psych ROS   GI/Hepatic negative GI ROS, Neg liver ROS,,,  Endo/Other  negative endocrine ROS    Renal/GU negative Renal ROS     Musculoskeletal  (+) Arthritis , Osteoarthritis,    Abdominal   Peds  Hematology negative hematology ROS (+)   Anesthesia Other Findings Cancer 2007 Prostate, melanoma  Reproductive/Obstetrics negative OB ROS                             Anesthesia Physical Anesthesia Plan  ASA: 3  Anesthesia Plan: General   Post-op Pain Management:    Induction: Intravenous  PONV Risk Score and Plan: Ondansetron and Dexamethasone  Airway Management Planned: Oral ETT  Additional Equipment:   Intra-op Plan:   Post-operative Plan: Extubation in OR  Informed Consent: I have reviewed the patients History and Physical, chart, labs and discussed the procedure including the risks, benefits and alternatives for the proposed anesthesia with the patient or authorized representative who has indicated his/her understanding and acceptance.       Plan Discussed with: Anesthesiologist  Anesthesia Plan Comments:        Anesthesia Quick Evaluation

## 2022-11-15 ENCOUNTER — Other Ambulatory Visit: Payer: Self-pay

## 2022-11-15 ENCOUNTER — Encounter (HOSPITAL_COMMUNITY): Payer: Self-pay | Admitting: General Surgery

## 2022-11-15 ENCOUNTER — Encounter (HOSPITAL_COMMUNITY)
Admission: RE | Disposition: A | Discharge status: Home / Self Care | Payer: Self-pay | Source: Home / Self Care | Attending: General Surgery

## 2022-11-15 ENCOUNTER — Ambulatory Visit (HOSPITAL_COMMUNITY): Payer: Medicare Other | Admitting: Anesthesiology

## 2022-11-15 ENCOUNTER — Ambulatory Visit (HOSPITAL_BASED_OUTPATIENT_CLINIC_OR_DEPARTMENT_OTHER): Payer: Medicare Other | Admitting: Anesthesiology

## 2022-11-15 ENCOUNTER — Ambulatory Visit (HOSPITAL_COMMUNITY)
Admission: RE | Admit: 2022-11-15 | Discharge: 2022-11-15 | Disposition: A | Discharge status: Home / Self Care | Payer: Medicare Other | Attending: General Surgery | Admitting: General Surgery

## 2022-11-15 DIAGNOSIS — Z9079 Acquired absence of other genital organ(s): Secondary | ICD-10-CM | POA: Diagnosis not present

## 2022-11-15 DIAGNOSIS — K5730 Diverticulosis of large intestine without perforation or abscess without bleeding: Secondary | ICD-10-CM | POA: Insufficient documentation

## 2022-11-15 DIAGNOSIS — Z79899 Other long term (current) drug therapy: Secondary | ICD-10-CM | POA: Insufficient documentation

## 2022-11-15 DIAGNOSIS — M1990 Unspecified osteoarthritis, unspecified site: Secondary | ICD-10-CM | POA: Diagnosis not present

## 2022-11-15 DIAGNOSIS — I700 Atherosclerosis of aorta: Secondary | ICD-10-CM | POA: Diagnosis not present

## 2022-11-15 DIAGNOSIS — I10 Essential (primary) hypertension: Secondary | ICD-10-CM

## 2022-11-15 DIAGNOSIS — Z8546 Personal history of malignant neoplasm of prostate: Secondary | ICD-10-CM | POA: Insufficient documentation

## 2022-11-15 DIAGNOSIS — K4090 Unilateral inguinal hernia, without obstruction or gangrene, not specified as recurrent: Secondary | ICD-10-CM | POA: Insufficient documentation

## 2022-11-15 DIAGNOSIS — Z87891 Personal history of nicotine dependence: Secondary | ICD-10-CM

## 2022-11-15 DIAGNOSIS — Z85820 Personal history of malignant melanoma of skin: Secondary | ICD-10-CM | POA: Insufficient documentation

## 2022-11-15 HISTORY — PX: XI ROBOTIC ASSISTED INGUINAL HERNIA REPAIR WITH MESH: SHX6706

## 2022-11-15 SURGERY — REPAIR, HERNIA, INGUINAL, ROBOT-ASSISTED, LAPAROSCOPIC, USING MESH
Anesthesia: General | Site: Abdomen | Laterality: Right

## 2022-11-15 MED ORDER — PROPOFOL 10 MG/ML IV BOLUS
INTRAVENOUS | Status: DC | PRN
  Administered 2022-11-15: 180 mg via INTRAVENOUS

## 2022-11-15 MED ORDER — ONDANSETRON HCL 4 MG/2ML IJ SOLN: 4.0000 mg | Freq: Once | INTRAMUSCULAR | Status: DC | PRN

## 2022-11-15 MED ORDER — CHLORHEXIDINE GLUCONATE 0.12 % MT SOLN
OROMUCOSAL | Status: AC
  Administered 2022-11-15: 15 mL
  Filled 2022-11-15: qty 15

## 2022-11-15 MED ORDER — FENTANYL CITRATE (PF) 100 MCG/2ML IJ SOLN
INTRAMUSCULAR | Status: DC | PRN
  Administered 2022-11-15: 50 ug via INTRAVENOUS
  Administered 2022-11-15: 25 ug via INTRAVENOUS
  Administered 2022-11-15: 50 ug via INTRAVENOUS

## 2022-11-15 MED ORDER — CHLORHEXIDINE GLUCONATE CLOTH 2 % EX PADS: 6.0000 | MEDICATED_PAD | Freq: Once | CUTANEOUS | Status: DC

## 2022-11-15 MED ORDER — CHLORHEXIDINE GLUCONATE 0.12 % MT SOLN
15.0000 mL | Freq: Once | OROMUCOSAL | Status: AC
  Administered 2022-11-15: 15 mL via OROMUCOSAL

## 2022-11-15 MED ORDER — DEXAMETHASONE SODIUM PHOSPHATE 10 MG/ML IJ SOLN
INTRAMUSCULAR | Status: DC | PRN
  Administered 2022-11-15: 10 mg via INTRAVENOUS

## 2022-11-15 MED ORDER — HYDROCODONE-ACETAMINOPHEN 7.5-325 MG PO TABS: 1.0000 | ORAL_TABLET | Freq: Once | ORAL | Status: DC | PRN

## 2022-11-15 MED ORDER — BUPIVACAINE-EPINEPHRINE 0.25% -1:200000 IJ SOLN
INTRAMUSCULAR | Status: DC | PRN
  Administered 2022-11-15: 30 mL

## 2022-11-15 MED ORDER — ACETAMINOPHEN 500 MG PO TABS
1000.0000 mg | ORAL_TABLET | ORAL | Status: AC
  Administered 2022-11-15: 1000 mg via ORAL

## 2022-11-15 MED ORDER — STERILE WATER FOR IRRIGATION IR SOLN
Status: DC | PRN
  Administered 2022-11-15: 500 mL

## 2022-11-15 MED ORDER — BUPIVACAINE LIPOSOME 1.3 % IJ SUSP
INTRAMUSCULAR | Status: AC
  Filled 2022-11-15: qty 20

## 2022-11-15 MED ORDER — LIDOCAINE HCL (PF) 2 % IJ SOLN
INTRAMUSCULAR | Status: AC
  Filled 2022-11-15: qty 5

## 2022-11-15 MED ORDER — ONDANSETRON HCL 4 MG PO TABS: 4.0000 mg | ORAL_TABLET | Freq: Three times a day (TID) | ORAL | 1 refills | Status: AC | PRN

## 2022-11-15 MED ORDER — MEPERIDINE HCL 50 MG/ML IJ SOLN: 6.2500 mg | INTRAMUSCULAR | Status: DC | PRN

## 2022-11-15 MED ORDER — PHENYLEPHRINE 80 MCG/ML (10ML) SYRINGE FOR IV PUSH (FOR BLOOD PRESSURE SUPPORT)
PREFILLED_SYRINGE | INTRAVENOUS | Status: AC
  Filled 2022-11-15: qty 20

## 2022-11-15 MED ORDER — PROPOFOL 10 MG/ML IV BOLUS
INTRAVENOUS | Status: AC
  Filled 2022-11-15: qty 20

## 2022-11-15 MED ORDER — BUPIVACAINE-EPINEPHRINE (PF) 0.25% -1:200000 IJ SOLN
INTRAMUSCULAR | Status: AC
  Filled 2022-11-15: qty 30

## 2022-11-15 MED ORDER — HYDROMORPHONE HCL 1 MG/ML IJ SOLN: 0.2500 mg | INTRAMUSCULAR | Status: DC | PRN

## 2022-11-15 MED ORDER — PHENYLEPHRINE HCL (PRESSORS) 10 MG/ML IV SOLN
INTRAVENOUS | Status: DC | PRN
  Administered 2022-11-15: 100 ug via INTRAVENOUS
  Administered 2022-11-15: 80 ug via INTRAVENOUS
  Administered 2022-11-15: 160 ug via INTRAVENOUS
  Administered 2022-11-15: 100 ug via INTRAVENOUS
  Administered 2022-11-15: 1600 ug via INTRAVENOUS
  Administered 2022-11-15 (×3): 80 ug via INTRAVENOUS
  Administered 2022-11-15: 160 ug via INTRAVENOUS

## 2022-11-15 MED ORDER — ONDANSETRON HCL 4 MG/2ML IJ SOLN
INTRAMUSCULAR | Status: DC | PRN
  Administered 2022-11-15: 4 mg via INTRAVENOUS

## 2022-11-15 MED ORDER — OXYCODONE HCL 5 MG PO TABS: 5.0000 mg | ORAL_TABLET | ORAL | 0 refills | Status: DC | PRN

## 2022-11-15 MED ORDER — ACETAMINOPHEN 500 MG PO TABS
ORAL_TABLET | ORAL | Status: AC
  Filled 2022-11-15: qty 2

## 2022-11-15 MED ORDER — VANCOMYCIN HCL IN DEXTROSE 1-5 GM/200ML-% IV SOLN
INTRAVENOUS | Status: AC
  Filled 2022-11-15: qty 200

## 2022-11-15 MED ORDER — VANCOMYCIN HCL IN DEXTROSE 1-5 GM/200ML-% IV SOLN
1000.0000 mg | INTRAVENOUS | Status: AC
  Administered 2022-11-15: 1000 mg via INTRAVENOUS

## 2022-11-15 MED ORDER — LACTATED RINGERS IV SOLN: INTRAVENOUS | Status: DC

## 2022-11-15 MED ORDER — EPHEDRINE SULFATE (PRESSORS) 50 MG/ML IJ SOLN
INTRAMUSCULAR | Status: DC | PRN
  Administered 2022-11-15 (×2): 5 mg via INTRAVENOUS

## 2022-11-15 MED ORDER — ROCURONIUM 10MG/ML (10ML) SYRINGE FOR MEDFUSION PUMP - OPTIME
INTRAVENOUS | Status: DC | PRN
  Administered 2022-11-15: 5 mg via INTRAVENOUS
  Administered 2022-11-15: 50 mg via INTRAVENOUS
  Administered 2022-11-15: 5 mg via INTRAVENOUS

## 2022-11-15 MED ORDER — FENTANYL CITRATE (PF) 100 MCG/2ML IJ SOLN
INTRAMUSCULAR | Status: AC
  Filled 2022-11-15: qty 2

## 2022-11-15 MED ORDER — BUPIVACAINE LIPOSOME 1.3 % IJ SUSP
INTRAMUSCULAR | Status: DC | PRN
  Administered 2022-11-15: 20 mL

## 2022-11-15 MED ORDER — SUGAMMADEX SODIUM 200 MG/2ML IV SOLN
INTRAVENOUS | Status: DC | PRN
  Administered 2022-11-15: 200 mg via INTRAVENOUS

## 2022-11-15 MED ORDER — PHENYLEPHRINE 80 MCG/ML (10ML) SYRINGE FOR IV PUSH (FOR BLOOD PRESSURE SUPPORT)
PREFILLED_SYRINGE | INTRAVENOUS | Status: AC
  Filled 2022-11-15: qty 10

## 2022-11-15 SURGICAL SUPPLY — 52 items
ADH SKN CLS APL DERMABOND .7 (GAUZE/BANDAGES/DRESSINGS) ×1
BLADE SURG 15 STRL LF DISP TIS (BLADE) IMPLANT
BLADE SURG 15 STRL SS (BLADE) ×1
COVER MAYO STAND XLG (MISCELLANEOUS) ×2 IMPLANT
COVER SURGICAL LIGHT HANDLE (MISCELLANEOUS) ×2 IMPLANT
COVER TIP SHEARS 8 DVNC (MISCELLANEOUS) ×2 IMPLANT
COVER TIP SHEARS 8MM DA VINCI (MISCELLANEOUS) ×2
DEFOGGER SCOPE WARMER CLEARIFY (MISCELLANEOUS) IMPLANT
DERMABOND ADVANCED .7 DNX12 (GAUZE/BANDAGES/DRESSINGS) ×2 IMPLANT
DRAPE ARM DVNC X/XI (DISPOSABLE) ×6 IMPLANT
DRAPE COLUMN DVNC XI (DISPOSABLE) ×2 IMPLANT
DRAPE DA VINCI XI ARM (DISPOSABLE) ×3
DRAPE DA VINCI XI COLUMN (DISPOSABLE) ×1
ELECT REM PT RETURN 9FT ADLT (ELECTROSURGICAL) ×1
ELECTRODE REM PT RTRN 9FT ADLT (ELECTROSURGICAL) ×2 IMPLANT
GAUZE SPONGE 4X4 12PLY STRL (GAUZE/BANDAGES/DRESSINGS) ×2 IMPLANT
GLOVE BIO SURGEON STRL SZ 6.5 (GLOVE) ×4 IMPLANT
GLOVE BIOGEL PI IND STRL 6.5 (GLOVE) ×4 IMPLANT
GLOVE BIOGEL PI IND STRL 7.0 (GLOVE) ×4 IMPLANT
GLOVE SURG SS PI 7.5 STRL IVOR (GLOVE) IMPLANT
GOWN STRL REUS W/TWL LRG LVL3 (GOWN DISPOSABLE) ×4 IMPLANT
IRRIGATOR SUCT 8 DISP DVNC XI (IRRIGATION / IRRIGATOR) IMPLANT
IRRIGATOR SUCTION 8MM XI DISP (IRRIGATION / IRRIGATOR) ×1
IV NS IRRIG 3000ML ARTHROMATIC (IV SOLUTION) IMPLANT
KIT PINK PAD W/HEAD ARE REST (MISCELLANEOUS) ×1
KIT PINK PAD W/HEAD ARM REST (MISCELLANEOUS) ×2 IMPLANT
KIT TURNOVER KIT A (KITS) ×2 IMPLANT
MANIFOLD NEPTUNE II (INSTRUMENTS) ×2 IMPLANT
MESH 3DMAX MID 4X6 RT LRG (Mesh General) IMPLANT
NDL INSUFFLATION 14GA 120MM (NEEDLE) ×2 IMPLANT
NEEDLE HYPO 22GX1.5 SAFETY (NEEDLE) ×2 IMPLANT
NEEDLE INSUFFLATION 14GA 120MM (NEEDLE) ×1 IMPLANT
OBTURATOR OPTICAL STANDARD 8MM (TROCAR) ×1
OBTURATOR OPTICAL STND 8 DVNC (TROCAR) ×1
OBTURATOR OPTICALSTD 8 DVNC (TROCAR) ×2 IMPLANT
PACK LAP CHOLECYSTECTOMY (MISCELLANEOUS) ×2 IMPLANT
PENCIL SMOKE EVACUATOR (MISCELLANEOUS) ×2 IMPLANT
SEAL CANN UNIV 5-8 DVNC XI (MISCELLANEOUS) ×6 IMPLANT
SEAL XI 5MM-8MM UNIVERSAL (MISCELLANEOUS) ×3
SET TUBE SMOKE EVAC HIGH FLOW (TUBING) ×2 IMPLANT
SOL PREP POV-IOD 4OZ 10% (MISCELLANEOUS) ×2 IMPLANT
STAPLER CANNULA SEAL DVNC XI (STAPLE) ×2 IMPLANT
STAPLER CANNULA SEAL XI (STAPLE) ×1
SUT MNCRL AB 4-0 PS2 18 (SUTURE) ×2 IMPLANT
SUT V-LOC 90 ABS 3-0 VLT  V-20 (SUTURE) ×2
SUT V-LOC 90 ABS 3-0 VLT V-20 (SUTURE) ×4 IMPLANT
SUT VIC AB 2-0 CT2 27 (SUTURE) IMPLANT
SYR 30ML LL (SYRINGE) ×2 IMPLANT
TAPE TRANSPORE STRL 2 31045 (GAUZE/BANDAGES/DRESSINGS) ×2 IMPLANT
TRAY FOL W/BAG SLVR 16FR STRL (SET/KITS/TRAYS/PACK) ×2 IMPLANT
TRAY FOLEY W/BAG SLVR 16FR LF (SET/KITS/TRAYS/PACK) ×1
WATER STERILE IRR 500ML POUR (IV SOLUTION) ×2 IMPLANT

## 2022-11-15 NOTE — Interval H&P Note (Signed)
History and Physical Interval Note:  11/15/2022 7:21 AM  Troy Jenkins  has presented today for surgery, with the diagnosis of RIGHT INGUINAL HERNIA.  The various methods of treatment have been discussed with the patient and family. After consideration of risks, benefits and other options for treatment, the patient has consented to  Procedure(s): XI ROBOTIC [place] (Right) as a surgical intervention.  The patient's history has been reviewed, patient examined, no change in status, stable for surgery.  I have reviewed the patient's chart and labs.  Questions were answered to the patient's satisfaction.     Virl Cagey

## 2022-11-15 NOTE — Progress Notes (Signed)
[facility]  Updated his wife. Will see him 1/30 post op. Rx to [facility]. Will need to urinate before discharge home.   Curlene Labrum, MD [facility] [address] [place], [place] [address] [phone] (office)

## 2022-11-15 NOTE — Anesthesia Postprocedure Evaluation (Signed)
Anesthesia Post Note  Patient: Troy Jenkins  Procedure(s) Performed: XI ROBOTIC ASSISTED INGUINAL HERNIA REPAIR WITH MESH (Right: Abdomen)  Patient location during evaluation: PACU Anesthesia Type: General Level of consciousness: awake and alert Pain management: pain level controlled Vital Signs Assessment: post-procedure vital signs reviewed and stable Respiratory status: spontaneous breathing, nonlabored ventilation, respiratory function stable and patient connected to nasal cannula oxygen Cardiovascular status: blood pressure returned to baseline and stable Postop Assessment: no apparent nausea or vomiting Anesthetic complications: no   No notable events documented.   Last Vitals:  Vitals:   11/15/22 1253 11/15/22 1304  BP: (!) 154/80 (!) 117/96  Pulse: (!) 104 (!) 104  Resp: 19 18  Temp:  36.4 C  SpO2: 100% 100%    Last Pain:  Vitals:   11/15/22 1304  TempSrc: Axillary  PainSc: [place]

## 2022-11-15 NOTE — Transfer of Care (Signed)
Immediate Anesthesia Transfer of Care Note  Patient: Troy Jenkins  Procedure(s) Performed: XI ROBOTIC ASSISTED INGUINAL HERNIA REPAIR WITH MESH (Right: Abdomen)  Patient Location: PACU  Anesthesia Type:General  Level of Consciousness: awake, alert , and oriented  Airway & Oxygen Therapy: Patient Spontanous Breathing and Patient connected to face mask oxygen  Post-op Assessment: Report given to RN, Post -op Vital signs reviewed and stable, and Patient moving all extremities  Post vital signs: Reviewed and stable  Last Vitals:  Vitals Value Taken Time  BP    Temp    Pulse 99 11/15/22 1133  Resp 16 11/15/22 1133  SpO2 99 % 11/15/22 1133  Vitals shown include unvalidated device data.  Last Pain:  Vitals:   11/15/22 0700  PainSc: 0-No pain         Complications: No notable events documented.

## 2022-11-15 NOTE — Op Note (Signed)
[facility] Operative Note  11/15/22  Preoperative Diagnosis: Right inguinal Hernia   Postoperative Diagnosis: Same   Procedure(s) Performed: Robotic assisted laparoscopic right inguinal hernia repair with mesh   Surgeon: Lanell Matar. Constance Haw, MD   Assistants: No qualified resident was available    Anesthesia: General endotracheal   Anesthesiologist: Nicanor Alcon, MD    Specimens: None   Estimated Blood Loss: Minimal   Blood Replacement: None    Complications: None   Wound Class:Clean   Operative Indications: The patient has a right inguinal hernia that is symptomatic and they want repaired. We discussed robotic assisted laparoscopic inguinal hernia repair and risk of bleeding, infection, issues with chronic pain post operatively, use of mesh, risk of recurrence, chance of needing to repair a bilateral hernia, risk of injury to bowel or bladder, and risk of injury to cord structures for male patients.   Findings: Vas Deferens and cord structures identified and preserved Bard 3D Max Medium Weight Mesh, large size Hemostasis achieved   Procedure: The patient was taken to the operating room and placed supine. General endotracheal anesthesia was induced. Intravenous antibiotics were  administered per protocol.  A foley catheter was placed and a orogastric tube positioned to decompress the stomach. The abdomen was prepared and draped in the usual sterile fashion.   An incision was marked 18/20 cm above the pubic tubercle, slightly above the umbilicus. Veress needle inserted supraumbilical. Saline drop test noted to be positive with gradual increase in pressure after initiation of gas insufflation.  15 mm of pressure was achieved prior to removing the Veress needle and then placing a 8 mm port via the Optiview technique through the supraumbilical site.  Inspection of the area afterwards noted no injury to the surrounding organs during insertion of the needle and the  port.  2 port sites were marked 8 cm to the lateral sides of the initial port, and a 8 mm robotic port was placed on the left side, another 8 mm robotic port on the right side under direct supervision. The [place] was then brought into the operative field and docked to the ports.  Examination of the abdominal cavity noted a right inguinal hernia. There was some midline scar with epiploic fat adherent to the midline   A peritoneal flap was created approximately 4-6cm cephalad to the defect by using scissors with electrocautery.  Dissection was carried down towards the pubic tubercle, developing the myopectineal orifice view. There was some scarring in the area that made this more challenging, but I ultimately was able to get safely down to the pubic  tubercle and develop the space.  Laterally the flap was carried towards the ASIS.  A very fatty indirect hernia sac was noted, which carefully dissected away from the adjacent tissues to be fully reduced out of hernia cavity.  Any bleeding was controlled with combination of electrocautery and manual pressure.  Suction was used to evacuate some bleeding noted on the medial aspect.   After confirming adequate dissection and the peritoneal reflection completely down and away from the cord structures at the deep ring, a large Bard 3DMax Medium weight mesh was placed within the anterior abdominal wall and secured in place using 2-0 Vicryl immediately above the pubic tubercle and superior on the abdominal wall ensuring no vessels or nerves were incorporated.  After noting proper placement of the mesh with the peritoneal reflection deep to it, the previously created peritoneal flap was secured back up to the  anterior abdominal wall using running 3-0 V-Lock. Any holes created in the peritoneal flap were closed with the redundant peritoneal tissue covering the holes in the medial aspect of the peritoneum. All needles were then removed out of the abdominal cavity,  Xi platform undocked from the ports and removed off of operative field.  Exparel mixed with marcaine was infused as ilioinguinal block and at the port sites.   The abdomen was then desufflated and ports removed. All skin incisions were closed with a subcuticular stitch of Monocryl 4-0. Dermabond was applied. The testis was gently pulled down into its anatomic position in the scrotum.  Final inspection revealed acceptable hemostasis. All counts were correct at the end of the case. The patient was awakened from anesthesia and extubated without complication.  The patient went to the PACU in stable condition.   Curlene Labrum, MD [facility] [address] [place], [place] [address] [phone] (office)

## 2022-11-15 NOTE — Anesthesia Procedure Notes (Signed)
Procedure Name: Intubation Date/Time: 11/15/2022 7:51 AM  Performed by: Ollen Bowl, CRNAPre-anesthesia Checklist: Patient identified, Patient being monitored, Timeout performed, Emergency Drugs available and Suction available Patient Re-evaluated:Patient Re-evaluated prior to induction Oxygen Delivery Method: Circle system utilized Preoxygenation: Pre-oxygenation with 100% oxygen Induction Type: IV induction Ventilation: Mask ventilation without difficulty Laryngoscope Size: Mac and 3 Grade View: Grade I Tube type: Oral Tube size: 7.5 mm Number of attempts: 1 Airway Equipment and Method: Stylet Placement Confirmation: ETT inserted through vocal cords under direct vision, positive ETCO2 and breath sounds checked- equal and bilateral Secured at: 21 cm Tube secured with: Tape Dental Injury: Teeth and Oropharynx as per pre-operative assessment

## 2022-11-15 NOTE — Discharge Instructions (Signed)
Discharge Robotic Assisted Laparoscopic Surgery Instructions:  Common Complaints: Right shoulder pain is common after laparoscopic surgery.  This is secondary to the gas used in the surgery being trapped under the diaphragm.  Walk to help your body absorb the gas. This will improve in a few days. You may also have some gas and bruising into your scrotum. This will resolve with time. Call if you have any swelling or bruising that you feel is excessive.  Pain at the port sites are common, especially the larger port sites. This will improve with time.  Some nausea is common and poor appetite. The main goal is to stay hydrated the first few days after surgery.   Diet/ Activity: Diet as tolerated. You may not have an appetite, but it is important to stay hydrated.  Drink 64 ounces of water a day. Your appetite will return with time.  Shower per your regular routine daily.  Do not take hot showers. Take warm showers that are less than 10 minutes. Rest and listen to your body, but do not remain in bed all day.  Walk everyday for at least 15-20 minutes. Deep cough and move around every 1-2 hours in the first few days after surgery.  Do not lift > 10 lbs, perform excessive bending, pushing, pulling, squatting for 2-3 weeks after surgery.  Do not pick at the dermabond glue on your incision sites.  This glue film will remain in place for 1-2 weeks and will start to peel off.  Do not place lotions or balms on your incision unless instructed to specifically by Dr. Constance Haw.   Pain Expectations and Narcotics: -After surgery you will have pain associated with your incisions and this is normal. The pain is muscular and nerve pain, and will get better with time. -You are encouraged and expected to take non narcotic medications like tylenol and ibuprofen (when able) to treat pain as multiple modalities can aid with pain treatment. -Narcotics are only used when pain is severe or there is breakthrough pain. -You  are not expected to have a pain score of 0 after surgery, as we cannot prevent pain. A pain score of 3-4 that allows you to be functional, move, walk, and tolerate some activity is the goal. The pain will continue to improve over the days after surgery and is dependent on your surgery. -Due to [place] law, we are only able to give a certain amount of pain medication to treat post operative pain, and we only give additional narcotics on a patient by patient basis.  -For most laparoscopic surgery, studies have shown that the majority of patients only need 10-15 narcotic pills, and for open surgeries most patients only need 15-20.   -Having appropriate expectations of pain and knowledge of pain management with non narcotics is important as we do not want anyone to become addicted to narcotic pain medication.  -Using ice packs in the first 48 hours and heating pads after 48 hours, wearing an abdominal binder (when recommended), and using over the counter medications are all ways to help with pain management.   -Simple acts like meditation and mindfulness practices after surgery can also help with pain control and research has proven the benefit of these practices.  Medication: Take tylenol and ibuprofen as needed for pain control, alternating every 4-6 hours.  Example:  Tylenol '1000mg'$  @ 6am, 12noon, 6pm, 63mdnight (Do not exceed '4000mg'$  of tylenol a day). Ibuprofen '800mg'$  @ 9am, 3pm, 9pm, 3am (Do not exceed '3600mg'$  of ibuprofen  a day).  Take Roxicodone for breakthrough pain every 4 hours.  Take Colace for constipation related to narcotic pain medication. If you do not have a bowel movement in 2 days, take Miralax over the counter.  Drink plenty of water to also prevent constipation.   Contact Information: If you have questions or concerns, please call our office, [phone], Monday- Thursday 8AM-5PM and Friday 8AM-12Noon.  If it is after hours or on the weekend, please call [facility] Main Number, [phone],  [phone], and ask to speak to the surgeon on call for Dr. Constance Haw at [facility].

## 2022-11-21 ENCOUNTER — Encounter (HOSPITAL_COMMUNITY): Payer: Self-pay | Admitting: General Surgery

## 2022-12-12 ENCOUNTER — Encounter: Payer: Self-pay | Admitting: General Surgery

## 2022-12-12 ENCOUNTER — Ambulatory Visit (INDEPENDENT_AMBULATORY_CARE_PROVIDER_SITE_OTHER): Payer: Medicare Other | Admitting: General Surgery

## 2022-12-12 VITALS — BP 149/78 | HR 74 | Temp 98.2°F | Resp 14 | Ht 70.0 in | Wt 183.0 lb

## 2022-12-12 DIAGNOSIS — Z09 Encounter for follow-up examination after completed treatment for conditions other than malignant neoplasm: Secondary | ICD-10-CM

## 2022-12-12 DIAGNOSIS — K4090 Unilateral inguinal hernia, without obstruction or gangrene, not specified as recurrent: Secondary | ICD-10-CM

## 2022-12-12 NOTE — Patient Instructions (Signed)
Ok to start hitting some golf balls but would go slow.  Call if the fluid pocket is not shrinking or getting better.

## 2022-12-13 NOTE — Progress Notes (Signed)
[facility]  Doing well but does have some swelling. Used minimal to no narcotics.  BP (!) 149/78   Pulse 74   Temp 98.2 F (36.8 C) (Oral)   Resp 14   Ht '5\' 10"'$  (1.778 m)   Wt 183 lb (83 kg)   SpO2 98%   BMI 26.26 kg/m  Right inguinal region with swelling, firmness, feels like seroma, non mobile  Patient s/p robotic assisted laparoscopic inguinal hernia repair on mesh on the right.   Ok to start hitting some golf balls but would go slow.  Call if the fluid pocket is not shrinking or getting better.  This should get better in 2-3 months.   PRN follow up   Curlene Labrum, MD [facility] [address] [place],  [address] [phone] (office)

## 2022-12-29 ENCOUNTER — Encounter: Payer: Medicare Other | Admitting: Internal Medicine

## 2023-01-01 ENCOUNTER — Encounter: Payer: Self-pay | Admitting: Internal Medicine

## 2023-01-01 ENCOUNTER — Ambulatory Visit (INDEPENDENT_AMBULATORY_CARE_PROVIDER_SITE_OTHER): Payer: Medicare Other | Admitting: Internal Medicine

## 2023-01-01 ENCOUNTER — Ambulatory Visit (INDEPENDENT_AMBULATORY_CARE_PROVIDER_SITE_OTHER): Payer: Medicare Other

## 2023-01-01 VITALS — BP 142/86 | HR 77 | Temp 97.6°F | Ht 70.0 in | Wt 181.0 lb

## 2023-01-01 VITALS — BP 160/80 | HR 77 | Temp 97.6°F | Ht 70.0 in | Wt 181.0 lb

## 2023-01-01 DIAGNOSIS — E782 Mixed hyperlipidemia: Secondary | ICD-10-CM | POA: Diagnosis not present

## 2023-01-01 DIAGNOSIS — Z85820 Personal history of malignant melanoma of skin: Secondary | ICD-10-CM | POA: Diagnosis not present

## 2023-01-01 DIAGNOSIS — Z0000 Encounter for general adult medical examination without abnormal findings: Secondary | ICD-10-CM | POA: Diagnosis not present

## 2023-01-01 DIAGNOSIS — M1A9XX0 Chronic gout, unspecified, without tophus (tophi): Secondary | ICD-10-CM | POA: Diagnosis not present

## 2023-01-01 DIAGNOSIS — Z8546 Personal history of malignant neoplasm of prostate: Secondary | ICD-10-CM | POA: Diagnosis not present

## 2023-01-01 DIAGNOSIS — I10 Essential (primary) hypertension: Secondary | ICD-10-CM | POA: Diagnosis not present

## 2023-01-01 DIAGNOSIS — Z9889 Other specified postprocedural states: Secondary | ICD-10-CM

## 2023-01-01 LAB — CBC
HCT: 45.7 % (ref 39.0–52.0)
Hemoglobin: 15.7 g/dL (ref 13.0–17.0)
MCHC: 34.4 g/dL (ref 30.0–36.0)
MCV: 90.5 fl (ref 78.0–100.0)
Platelets: 233 10*3/uL (ref 150.0–400.0)
RBC: 5.05 Mil/uL (ref 4.22–5.81)
RDW: 12.7 % (ref 11.5–15.5)
WBC: 5.8 10*3/uL (ref 4.0–10.5)

## 2023-01-01 LAB — LIPID PANEL
Cholesterol: 198 mg/dL (ref 0–200)
HDL: 54.3 mg/dL (ref >39.00)
NonHDL: 143.48
Total CHOL/HDL Ratio: 4
Triglycerides: 218 mg/dL — ABNORMAL HIGH (ref 0.0–149.0)
VLDL: 43.6 mg/dL — ABNORMAL HIGH (ref 0.0–40.0)

## 2023-01-01 LAB — COMPREHENSIVE METABOLIC PANEL
ALT: 19 U/L (ref 0–53)
AST: 24 U/L (ref 0–37)
Albumin: 4.5 g/dL (ref 3.5–5.2)
Alkaline Phosphatase: 70 U/L (ref 39–117)
BUN: 11 mg/dL (ref 6–23)
CO2: 29 mEq/L (ref 19–32)
Calcium: 10.5 mg/dL (ref 8.4–10.5)
Chloride: 101 mEq/L (ref 96–112)
Creatinine, Ser: 1.01 mg/dL (ref 0.40–1.50)
GFR: 69.76 mL/min (ref >60.00)
Glucose, Bld: 102 mg/dL — ABNORMAL HIGH (ref 70–99)
Potassium: 5 mEq/L (ref 3.5–5.1)
Sodium: 140 mEq/L (ref 135–145)
Total Bilirubin: 0.9 mg/dL (ref 0.2–1.2)
Total Protein: 7.5 g/dL (ref 6.0–8.3)

## 2023-01-01 LAB — LDL CHOLESTEROL, DIRECT: Direct LDL: 114 mg/dL

## 2023-01-01 LAB — PSA: PSA: 0 ng/mL — ABNORMAL LOW (ref 0.10–4.00)

## 2023-01-01 LAB — URIC ACID: Uric Acid, Serum: 7.5 mg/dL (ref 4.0–7.8)

## 2023-01-01 LAB — HEMOGLOBIN A1C: Hgb A1c MFr Bld: 5.7 % (ref 4.6–6.5)

## 2023-01-01 NOTE — Patient Instructions (Addendum)
Please schedule your next Medicare Wellness Visit with your Nurse Health Advisor in 1 year by calling [phone].   Health Maintenance After Age 82 After age 67, you are at a higher risk for certain long-term diseases and infections as well as injuries from falls. Falls are a major cause of broken bones and head injuries in people who are older than age 67. Getting regular preventive care can help to keep you healthy and well. Preventive care includes getting regular testing and making lifestyle changes as recommended by your health care provider. Talk with your health care provider about: Which screenings and tests you should have. A screening is a test that checks for a disease when you have no symptoms. A diet and exercise plan that is right for you. What should I know about screenings and tests to prevent falls? Screening and testing are the best ways to find a health problem early. Early diagnosis and treatment give you the best chance of managing medical conditions that are common after age 79. Certain conditions and lifestyle choices may make you more likely to have a fall. Your health care provider may recommend: Regular vision checks. Poor vision and conditions such as cataracts can make you more likely to have a fall. If you wear glasses, make sure to get your prescription updated if your vision changes. Medicine review. Work with your health care provider to regularly review all of the medicines you are taking, including over-the-counter medicines. Ask your health care provider about any side effects that may make you more likely to have a fall. Tell your health care provider if any medicines that you take make you feel dizzy or sleepy. Strength and balance checks. Your health care provider may recommend certain tests to check your strength and balance while standing, walking, or changing positions. Foot health exam. Foot pain and numbness, as well as not wearing proper footwear, can make  you more likely to have a fall. Screenings, including: Osteoporosis screening. Osteoporosis is a condition that causes the bones to get weaker and break more easily. Blood pressure screening. Blood pressure changes and medicines to control blood pressure can make you feel dizzy. Depression screening. You may be more likely to have a fall if you have a fear of falling, feel depressed, or feel unable to do activities that you used to do. Alcohol use screening. Using too much alcohol can affect your balance and may make you more likely to have a fall. Follow these instructions at home: Lifestyle Do not drink alcohol if: Your health care provider tells you not to drink. If you drink alcohol: Limit how much you have to: 0-1 drink a day for women. 0-2 drinks a day for men. Know how much alcohol is in your drink. In the [place] one drink equals one 12 oz bottle of beer (355 mL), one 5 oz glass of wine (148 mL), or one 1 oz glass of hard liquor (44 mL). Do not use any products that contain nicotine or tobacco. These products include cigarettes, chewing tobacco, and vaping devices, such as e-cigarettes. If you need help quitting, ask your health care provider. Activity  Follow a regular exercise program to stay fit. This will help you maintain your balance. Ask your health care provider what types of exercise are appropriate for you. If you need a cane or walker, use it as recommended by your health care provider. Wear supportive shoes that have nonskid soles. Safety  Remove any tripping hazards, such as rugs, cords,  and clutter. Install safety equipment such as grab bars in bathrooms and safety rails on stairs. Keep rooms and walkways well-lit. General instructions Talk with your health care provider about your risks for falling. Tell your health care provider if: You fall. Be sure to tell your health care provider about all falls, even ones that seem minor. You feel dizzy, tiredness (fatigue),  or off-balance. Take over-the-counter and prescription medicines only as told by your health care provider. These include supplements. Eat a healthy diet and maintain a healthy weight. A healthy diet includes low-fat dairy products, low-fat (lean) meats, and fiber from whole grains, beans, and lots of fruits and vegetables. Stay current with your vaccines. Schedule regular health, dental, and eye exams. Summary Having a healthy lifestyle and getting preventive care can help to protect your health and wellness after age 68. Screening and testing are the best way to find a health problem early and help you avoid having a fall. Early diagnosis and treatment give you the best chance for managing medical conditions that are more common for people who are older than age 51. Falls are a major cause of broken bones and head injuries in people who are older than age 60. Take precautions to prevent a fall at home. Work with your health care provider to learn what changes you can make to improve your health and wellness and to prevent falls. This information is not intended to replace advice given to you by your health care provider. Make sure you discuss any questions you have with your health care provider. Document Revised: 03/21/2021 Document Reviewed: 03/21/2021 Elsevier Patient Education  [employer].

## 2023-01-01 NOTE — Assessment & Plan Note (Signed)
Seeing dermatology regularly and no new lesions today.

## 2023-01-01 NOTE — Assessment & Plan Note (Signed)
Checking lipid panel and adjust as needed lovastatin 40 mg daily for LDL <100 goal.

## 2023-01-01 NOTE — Assessment & Plan Note (Signed)
Checking uric acid and adjust as needed. No flare of gout and not on medication at this time.

## 2023-01-01 NOTE — Assessment & Plan Note (Signed)
No symptoms, checking PSA for stability.

## 2023-01-01 NOTE — Assessment & Plan Note (Signed)
Flu shot yearly. Covid-19 counseled. Pneumonia complete. Shingrix complete. Tetanus due 2031. Colonoscopy aged out. Counseled about sun safety and mole surveillance. Counseled about the dangers of distracted driving. Given 10 year screening recommendations.

## 2023-01-01 NOTE — Progress Notes (Signed)
   Subjective:   Patient ID: Troy Jenkins, male    DOB: 1941-05-03, 82 y.o.   MRN: [id number]  HPI The patient is here for physical.  PMH, [facility], social history reviewed and updated  Review of Systems  Constitutional: Negative.   HENT: Negative.    Eyes: Negative.   Respiratory:  Negative for cough, chest tightness and shortness of breath.   Cardiovascular:  Negative for chest pain, palpitations and leg swelling.  Gastrointestinal:  Negative for abdominal distention, abdominal pain, constipation, diarrhea, nausea and vomiting.  Musculoskeletal: Negative.   Skin: Negative.   Neurological: Negative.   Psychiatric/Behavioral: Negative.      Objective:  Physical Exam Constitutional:      Appearance: He is well-developed.  HENT:     Head: Normocephalic and atraumatic.  Cardiovascular:     Rate and Rhythm: Normal rate and regular rhythm.  Pulmonary:     Effort: Pulmonary effort is normal. No respiratory distress.     Breath sounds: Normal breath sounds. No wheezing or rales.  Abdominal:     General: Bowel sounds are normal. There is no distension.     Palpations: Abdomen is soft.     Tenderness: There is no abdominal tenderness. There is no rebound.     Comments: Seroma firm   Musculoskeletal:     Cervical back: Normal range of motion.  Skin:    General: Skin is warm and dry.  Neurological:     Mental Status: He is alert and oriented to person, place, and time.     Coordination: Coordination normal.     Vitals:   01/01/23 0939 01/01/23 0958  BP: (!) 142/86 (!) 160/80  Pulse: 77   Temp: 97.6 F (36.4 C)   TempSrc: Oral   SpO2: 99%   Weight: 181 lb (82.1 kg)   Height: 5' 10"$  (1.778 m)     Assessment & Plan:

## 2023-01-01 NOTE — Assessment & Plan Note (Signed)
BP mildly elevated has not taken meds today. Taking lisinopril 30 mg daily currently. Checking CMP and CBC and adjust as needed.

## 2023-01-01 NOTE — Progress Notes (Signed)
Subjective:   Troy Jenkins is a 82 y.o. male who presents for Medicare Annual/Subsequent preventive examination.  Review of Systems    No ROS. Medicare Wellness Visit. Additional risk factors are reflected in social history. Cardiac Risk Factors include: advanced age (>35mn, >>1women);dyslipidemia;hypertension;male gender     Objective:    Today's Vitals   01/01/23 0854 01/01/23 0902  BP: (!) 152/86 (!) 142/86  Pulse: 77   Temp: 97.6 F (36.4 C)   TempSrc: Temporal   SpO2: 99%   Weight: 181 lb (82.1 kg)   Height: 5' 10"$  (1.778 m)    Body mass index is 25.97 kg/m.     01/01/2023    9:09 AM 11/15/2022    6:26 AM 11/10/2022   10:05 AM 12/17/2018   10:37 AM 12/14/2017   10:04 AM 11/16/2015    9:43 AM  Advanced Directives  Does Patient Have a Medical Advance Directive? Yes No No Yes Yes Yes  Type of [place]ar[place]of [place] will   [place]Living will [place] will [place]un[place] will  Does patient want to make changes to medical advance directive? No - Patient declined     No - Patient declined  Copy of [place] Chart? No - copy requested   No - copy requested No - copy requested No - copy requested  Would patient like information on creating a medical advance directive?  No - Patient declined No - Patient declined       Current Medications (verified) Outpatient Encounter Medications as of 01/01/2023  Medication Sig   aspirin 81 MG tablet Take 81 mg by mouth daily.   diclofenac Sodium (VOLTAREN) 1 % GEL Apply 4 g topically 4 (four) times daily as needed (bone spur). Cream as needed   Iron, Ferrous Sulfate, 325 (65 Fe) MG TABS Take 325 mg by mouth daily.   lisinopril (ZESTRIL) 10 MG tablet TAKE 1 TABLET BY MOUTH DAILY  (ADDITION TO 20MG FOR TOTAL 30MG DAILY) (Patient taking differently: Take 10 mg by mouth at bedtime. TAKE 1 TABLET BY MOUTH  DAILY (ADDITION  TO 20MG FOR TOTAL 30MG DAILY))   lisinopril (ZESTRIL) 20 MG tablet TAKE 1 TABLET BY MOUTH DAILY   lovastatin (MEVACOR) 40 MG tablet TAKE 1 TABLET BY MOUTH AT  BEDTIME   Multiple Vitamin (MULTIVITAMIN) tablet Take 1 tablet by mouth daily.   ondansetron (ZOFRAN) 4 MG tablet Take 1 tablet (4 mg total) by mouth every 8 (eight) hours as needed.   TART CHERRY PO Take 1,000 mg by mouth daily.   tetrahydrozoline-zinc (VISINE-AC) 0.05-0.25 % ophthalmic solution Place 2 drops into both eyes 3 (three) times daily as needed (dry eyes).   triamcinolone (KENALOG) 0.1 % Apply 1 application topically 2 (two) times daily as needed.   Turmeric 500 MG CAPS Take 500 mg by mouth daily.   No facility-administered encounter medications on file as of 01/01/2023.    Allergies (verified) Amoxicillin, Doxycycline, and Iodine   History: Past Medical History:  Diagnosis Date   Arthritis    generalized   Cancer ([place] 2007   prostate   Cataract    not a surgical candidate at this time (03/07/2021)   GOUT 08/13/2007   HYPERLIPIDEMIA 08/13/2007   on meds   HYPERTENSION 08/13/2007   on meds   Ligament tear 06/22/12   left wrist    Skin cancer of arm 10/13   left arm; small  skin cancer removed; low grade   VARIX, SCROTAL, LEFT 07/23/2008   Past Surgical History:  Procedure Laterality Date   COLONOSCOPY  07/2010   COLONOSCOPY  2017   JP-MAC-supre(exc)-SSP x3   HERNIA REPAIR  2010   Left    POLYPECTOMY  2017   SSP x3   PROSTATE SURGERY  12/2005   TREATMENT FISTULA ANAL  1980   XI ROBOTIC ASSISTED INGUINAL HERNIA REPAIR WITH MESH Right 11/15/2022   Procedure: XI ROBOTIC ASSISTED INGUINAL HERNIA REPAIR WITH MESH;  Surgeon: Virl Cagey, MD;  Location: [facility];  Service: General;  Laterality: Right;   Family History  Problem Relation Age of Onset   Leukemia Mother    Diabetes Father    Stroke Father    Cancer Maternal Grandmother    Colon cancer Maternal Grandmother 81   Colon polyps Maternal  Grandmother 81   Stroke Paternal Grandfather    Breast cancer Sister 41   Esophageal cancer Neg Hx    Rectal cancer Neg Hx    Stomach cancer Neg Hx    Social History   Socioeconomic History   Marital status: Married    Spouse name: Not on file   Number of children: 0   Years of education: Not on file   Highest education level: Not on file  Occupational History   Occupation: retired  Tobacco Use   Smoking status: Former    Types: Cigarettes    Quit date: 05/31/1968    Years since quitting: 60.6   Smokeless tobacco: Never  Vaping Use   Vaping Use: Never used  Substance and Sexual Activity   Alcohol use: Yes    Alcohol/week: 0.0 standard drinks of alcohol    Comment: 1 per month   Drug use: No   Sexual activity: Not Currently  Other Topics Concern   Not on file  Social History Narrative   HS[employer], [place]- textile degree. Married 1965. No children. Work - retired from [employer]. Manages Theatre manager. Advanced Care Planning - raised the issue for future discussion.   Social Determinants of Health   Financial Resource Strain: Low Risk  (01/01/2023)   Overall Financial Resource Strain (CARDIA)    Difficulty of Paying Living Expenses: Not hard at all  Food Insecurity: No Food Insecurity (01/01/2023)   Hunger Vital Sign    Worried About Running Out of Food in the Last Year: Never true    Ran Out of Food in the Last Year: Never true  Transportation Needs: No Transportation Needs (01/01/2023)   PRAPARE - Hydrologist (Medical): No    Lack of Transportation (Non-Medical): No  Physical Activity: Sufficiently Active (01/01/2023)   Exercise Vital Sign    Days of Exercise per Week: 5 days    Minutes of Exercise per Session: 60 min  Stress: No Stress Concern Present (01/01/2023)   [place]    Feeling of Stress : Not at all  Social Connections: Moderately Integrated  (01/01/2023)   Social Connection and Isolation Panel [NHANES]    Frequency of Communication with Friends and Family: More than three times a week    Frequency of Social Gatherings with Friends and Family: More than three times a week    Attends Religious Services: Never    Marine scientist or Organizations: Yes    Attends Music therapist: More than 4 times per year    Marital Status:  Married    Tobacco Counseling Counseling given: Not Answered   Clinical Intake:  Pre-visit preparation completed: Yes  Pain : No/denies pain     BMI - recorded: 25 Nutritional Status: BMI 25 -29 Overweight Nutritional Risks: None Diabetes: No  How often do you need to have someone help you when you read instructions, pamphlets, or other written materials from your doctor or pharmacy?: 1 - Never What is the last grade level you completed in school?: 4 years of college  Interpreter Needed?: No  Information entered by :: Jillene Bucks, [place]   Activities of Daily Living    01/01/2023    9:09 AM 11/10/2022   10:06 AM  In your present state of health, do you have any difficulty performing the following activities:  Hearing? 0   Vision? 0   Difficulty concentrating or making decisions? 0   Walking or climbing stairs? 0   Dressing or bathing? 0   Doing errands, shopping? 0 0  Preparing Food and eating ? N   Using the Toilet? N   In the past six months, have you accidently leaked urine? N   Do you have problems with loss of bowel control? N   Managing your Medications? N   Managing your Finances? N   Housekeeping or managing your Housekeeping? N     Patient Care Team: Hoyt Koch, MD as PCP - General (Internal Medicine) Debara Pickett Nadean Corwin, MD as PCP - Cardiology (Cardiology) Allyn Kenner, MD (Dermatology) Raynelle Bring, MD (Urology)  Indicate any recent Medical Services you may have received from other than [facility] providers in the past year (date may be  approximate).     Assessment:   This is a routine wellness examination for Darryn.  Hearing/Vision screen Patient denied any hearing difficulty. No hearing aids. Patient wears glasses.  Dietary issues and exercise activities discussed: Current Exercise Habits: Home exercise routine, Type of exercise: walking;Other - see comments (golf and yardwork), Time (Minutes): 60, Frequency (Times/Week): 5, Weekly Exercise (Minutes/Week): 300, Intensity: Mild, Exercise limited by: None identified   Goals Addressed             This Visit's Progress    Patient Stated       Continue to stay healthy       Depression Screen    01/01/2023    9:08 AM 12/28/2021    8:00 AM 12/27/2020   10:23 AM 12/19/2019    8:45 AM 12/17/2018    9:30 AM 12/14/2017   10:06 AM 12/14/2017    9:24 AM  PHQ 2/9 Scores  PHQ - 2 Score 0 0 0 0 0 0 0  PHQ- 9 Score      0     Fall Risk    01/01/2023    9:09 AM 10/17/2022   10:08 AM 12/28/2021    8:00 AM 12/27/2020   10:23 AM 12/19/2019    8:45 AM  Fall Risk   Falls in the past year? 0 0 0 0 0  Number falls in past yr: 0  0 0   Injury with Fall? 0  0 0   Risk for fall due to : No Fall Risks      Follow up Falls evaluation completed Falls evaluation completed       [place]:  Any stairs in or around the home? Yes  If so, are there any without handrails? No  Home free of loose throw rugs in walkways, pet  beds, electrical cords, etc? Yes  Adequate lighting in your home to reduce risk of falls? Yes   ASSISTIVE DEVICES UTILIZED TO PREVENT FALLS:  Life alert? No  Use of a cane, walker or w/c? No  Grab bars in the bathroom? No  Shower chair or bench in shower? No  Elevated toilet seat or a handicapped toilet? No   TIMED UP AND GO:  Was the test performed? No .  Length of time to ambulate 10 feet: N/A sec.   Patient stated that he has no issues with gait or balance; does not use any assistive devices.  Cognitive  Function: Patient is cogitatively intact.    12/14/2017   10:09 AM  MMSE - Mini Mental State Exam  Orientation to time 5  Orientation to Place 5  Registration 3  Attention/ Calculation 5  Recall 3  Language- name 2 objects 2  Language- repeat 1  Language- follow 3 step command 3  Language- read & follow direction 1  Write a sentence 1  Copy design 1  Total score 30        01/01/2023    9:11 AM  6CIT Screen  What Year? 0 points  What month? 0 points  What time? 0 points  Count back from 20 0 points  Months in reverse 0 points  Repeat phrase 0 points  Total Score 0 points    Immunizations Immunization History  Administered Date(s) Administered   Fluad Quad(high Dose 65+) 07/09/2019, 09/11/2020, 07/29/2021   Influenza Split 08/13/2013, 09/14/2015, 08/13/2016, 08/13/2017   Influenza Whole 09/01/2012   Influenza, High Dose Seasonal PF 08/28/2016, 08/13/2017, 09/04/2018   Influenza-Unspecified 08/13/2009, 08/13/2010, 08/14/2011, 07/27/2013, 08/18/2014, 09/28/2015, 09/11/2018, 09/11/2020, 08/09/2022   PFIZER(Purple Top)SARS-COV-2 Vaccination 01/08/2020, 01/28/2020, 09/11/2020   Pneumococcal Conjugate-13 11/26/2013, 08/17/2014, 11/14/2014   Pneumococcal Polysaccharide-23 11/29/2015   Pneumococcal-Unspecified 08/27/2009   Td 09/30/2009   Td (Adult),5 Lf Tetanus Toxid, Preservative Free 05/06/2003   Tdap 05/06/2003, 11/13/2012, 12/19/2019   Zoster Recombinat (Shingrix) 07/10/2018, 09/17/2018    TDAP status: Up to date  Flu Vaccine status: Up to date  Pneumococcal vaccine status: Up to date  Covid-19 vaccine status: Completed vaccines  Qualifies for Shingles Vaccine? Yes   Zostavax completed No   Shingrix Completed?: Yes  Screening Tests Health Maintenance  Topic Date Due   COVID-19 Vaccine (4 - 2023-24 season) 07/14/2022   Medicare Annual Wellness (AWV)  01/02/2024   DTaP/Tdap/Td (5 - Td or Tdap) 12/18/2029   Pneumonia Vaccine 47+ Years old  Completed    INFLUENZA VACCINE  Completed   Zoster Vaccines- Shingrix  Completed   HPV VACCINES  Aged Out   COLONOSCOPY (Pts 45-39yr Insurance coverage will need to be confirmed)  Discontinued    Health Maintenance  Health Maintenance Due  Topic Date Due   COVID-19 Vaccine (4 - 2023-24 season) 07/14/2022    Colorectal cancer screening: Type of screening: Colonoscopy. Completed 03/21/2021. Repeat every N/A years  Lung Cancer Screening: (Low Dose CT Chest recommended if Age 82-80years, 30 pack-year currently smoking OR have quit w/in 15years.) does not qualify.   Lung Cancer Screening Referral: N/A  Additional Screening:  Hepatitis C Screening: does not qualify; Completed N/A  Vision Screening: Recommended annual ophthalmology exams for early detection of glaucoma and other disorders of the eye. Is the patient up to date with their annual eye exam?  Yes  Who is the provider or what is the name of the office in which the patient attends annual eye exams?  [facility] If pt is not established with a provider, would they like to be referred to a provider to establish care? No .   Dental Screening: Recommended annual dental exams for proper oral hygiene  Community Resource Referral / Chronic Care Management: CRR required this visit?  No   CCM required this visit?  No      Plan:     I have personally reviewed and noted the following in the patient's chart:   Medical and social history Use of alcohol, tobacco or illicit drugs  Current medications and supplements including opioid prescriptions. Patient is not currently taking opioid prescriptions. Functional ability and status Nutritional status Physical activity Advanced directives List of other physicians Hospitalizations, surgeries, and ER visits in previous 12 months Vitals Screenings to include cognitive, depression, and falls Referrals and appointments  In addition, I have reviewed and discussed with patient certain preventive  protocols, quality metrics, and best practice recommendations. A written personalized care plan for preventive services as well as general preventive health recommendations were provided to patient.     Rossie Muskrat, CMA   01/01/2023   Nurse Notes: N/A

## 2023-03-19 ENCOUNTER — Other Ambulatory Visit: Payer: Self-pay | Admitting: Internal Medicine

## 2023-05-31 ENCOUNTER — Other Ambulatory Visit: Payer: Self-pay | Admitting: Podiatry

## 2023-05-31 ENCOUNTER — Encounter: Payer: Self-pay | Admitting: Podiatry

## 2023-05-31 ENCOUNTER — Ambulatory Visit (INDEPENDENT_AMBULATORY_CARE_PROVIDER_SITE_OTHER): Payer: Medicare Other

## 2023-05-31 ENCOUNTER — Ambulatory Visit: Payer: Medicare Other | Admitting: Podiatry

## 2023-05-31 DIAGNOSIS — M7660 Achilles tendinitis, unspecified leg: Secondary | ICD-10-CM

## 2023-05-31 DIAGNOSIS — M7662 Achilles tendinitis, left leg: Secondary | ICD-10-CM | POA: Diagnosis not present

## 2023-05-31 DIAGNOSIS — M7661 Achilles tendinitis, right leg: Secondary | ICD-10-CM

## 2023-05-31 NOTE — Progress Notes (Signed)
Subjective:  Patient ID: Troy Jenkins, male    DOB: 11-03-1941,  MRN: [id number] HPI Chief Complaint  Patient presents with   Foot Pain    Pt came in today for pain in his right foot. Pt stated " the back of my hill on my right heel is very sore it started about a week ago I have bone spur on my left hill"    82 y.o. male presents with the above complaint.   ROS: Denies fever chills nausea vomit muscle aches pains calf pain back pain chest pain shortness of breath.  Feels that this may have started while playing golf.  But has started to feel a lot better since has not been playing golf over the last few days.  Past Medical History:  Diagnosis Date   Arthritis    generalized   Cancer ([facility]) 2007   prostate   Cataract    not a surgical candidate at this time (03/07/2021)   GOUT 08/13/2007   HYPERLIPIDEMIA 08/13/2007   on meds   HYPERTENSION 08/13/2007   on meds   Ligament tear 06/22/12   left wrist    Skin cancer of arm 10/13   left arm; small skin cancer removed; low grade   VARIX, SCROTAL, LEFT 07/23/2008   Past Surgical History:  Procedure Laterality Date   COLONOSCOPY  07/2010   COLONOSCOPY  2017   JP-MAC-supre(exc)-SSP x3   HERNIA REPAIR  2010   Left    POLYPECTOMY  2017   SSP x3   PROSTATE SURGERY  12/2005   TREATMENT FISTULA ANAL  1980   XI ROBOTIC ASSISTED INGUINAL HERNIA REPAIR WITH MESH Right 11/15/2022   Procedure: XI ROBOTIC ASSISTED INGUINAL HERNIA REPAIR WITH MESH;  Surgeon: Lucretia Roers, MD;  Location: [facility];  Service: General;  Laterality: Right;    Current Outpatient Medications:    aspirin 81 MG tablet, Take 81 mg by mouth daily., Disp: , Rfl:    diclofenac Sodium (VOLTAREN) 1 % GEL, Apply 4 g topically 4 (four) times daily as needed (bone spur). Cream as needed, Disp: , Rfl:    Iron, Ferrous Sulfate, 325 (65 Fe) MG TABS, Take 325 mg by mouth daily., Disp: , Rfl:    lisinopril (ZESTRIL) 10 MG tablet, TAKE 1 TABLET BY MOUTH DAILY  WITH 20MG   TABLET FOR A TOTAL  DAILY DOSE OF 30MG , Disp: 100 tablet, Rfl: 2   lisinopril (ZESTRIL) 20 MG tablet, TAKE 1 TABLET BY MOUTH DAILY, Disp: 100 tablet, Rfl: 2   lovastatin (MEVACOR) 40 MG tablet, TAKE 1 TABLET BY MOUTH AT  BEDTIME, Disp: 100 tablet, Rfl: 2   Multiple Vitamin (MULTIVITAMIN) tablet, Take 1 tablet by mouth daily., Disp: , Rfl:    ondansetron (ZOFRAN) 4 MG tablet, Take 1 tablet (4 mg total) by mouth every 8 (eight) hours as needed., Disp: 30 tablet, Rfl: 1   TART CHERRY PO, Take 1,000 mg by mouth daily., Disp: , Rfl:    tetrahydrozoline-zinc (VISINE-AC) 0.05-0.25 % ophthalmic solution, Place 2 drops into both eyes 3 (three) times daily as needed (dry eyes)., Disp: , Rfl:    triamcinolone (KENALOG) 0.1 %, Apply 1 application topically 2 (two) times daily as needed., Disp: 30 g, Rfl: 3   Turmeric 500 MG CAPS, Take 500 mg by mouth daily., Disp: , Rfl:   Allergies  Allergen Reactions   Amoxicillin Itching   Doxycycline Itching   Iodine Hives    IV dye (Contrast Dye) (Around 1960)  Review of Systems Objective:  There were no vitals filed for this visit.  General: Well developed, nourished, in no acute distress, alert and oriented x3   Dermatological: Skin is warm, dry and supple bilateral. Nails x 10 are well maintained; remaining integument appears unremarkable at this time. There are no open sores, no preulcerative lesions, no rash or signs of infection present.  Vascular: Dorsalis Pedis artery and Posterior Tibial artery pedal pulses are 2/4 bilateral with immedate capillary fill time. Pedal hair growth present. No varicosities and no lower extremity edema present bilateral.   Neruologic: Grossly intact via light touch bilateral. Vibratory intact via tuning fork bilateral. Protective threshold with Semmes Wienstein monofilament intact to all pedal sites bilateral. Patellar and Achilles deep tendon reflexes 2+ bilateral. No Babinski or clonus noted bilateral.   Musculoskeletal:  No gross boney pedal deformities bilateral. No pain, crepitus, or limitation noted with foot and ankle range of motion bilateral. Muscular strength 5/5 in all groups tested bilateral.  Mild tenderness on palpation posterior aspect of the right heel over the left with some fluctuance consistent with a bursitis.  Gait: Unassisted, Nonantalgic.    Radiographs:  Radiographs taken today demonstrate an osseously mature individual with good mineralization of the bone.  Posterior calcaneal heel spurs are noted approximately oriented.  With some soft tissue increase in density at the Achilles insertion site bilaterally right greater than left.  Assessment & Plan:   Assessment: Retrocalcaneal bursitis pre-Achilles bursitis.  Achilles tendinitis bilateral right greater than left.  Plan: Discussed etiology pathology conservative surgical therapies at this point discussed treatment options.  At this point he states that he would like to see if it is going to continue to improve we will follow-up with him on an as-needed basis.     Edel Rivero T. [place], [place]

## 2023-06-14 ENCOUNTER — Ambulatory Visit: Payer: Medicare Other | Admitting: Podiatry

## 2023-06-14 ENCOUNTER — Encounter: Payer: Self-pay | Admitting: Podiatry

## 2023-06-14 DIAGNOSIS — B351 Tinea unguium: Secondary | ICD-10-CM | POA: Diagnosis not present

## 2023-06-14 DIAGNOSIS — M7661 Achilles tendinitis, right leg: Secondary | ICD-10-CM

## 2023-06-14 DIAGNOSIS — L603 Nail dystrophy: Secondary | ICD-10-CM | POA: Diagnosis not present

## 2023-06-14 DIAGNOSIS — M7660 Achilles tendinitis, unspecified leg: Secondary | ICD-10-CM

## 2023-06-14 MED ORDER — DEXAMETHASONE SODIUM PHOSPHATE 120 MG/30ML IJ SOLN
2.0000 mg | Freq: Once | INTRAMUSCULAR | Status: AC
Start: 2023-06-14 — End: 2023-06-14
  Administered 2023-06-14: 2 mg via INTRA_ARTICULAR

## 2023-06-14 NOTE — Progress Notes (Signed)
Troy Jenkins presents today states that he is Troy Jenkins is still very tender and he is unable to play golf currently.  He states that is still sore really has never gotten much better.  He is also concerned about his thick toenails.  Objective: Vital signs are stable alert and oriented x 3.  Pulses are palpable.  There is no erythema edema salines drainage or he does have an area of fluctuance on the posterior aspect of the calcaneus at the insertion of the Troy Jenkins tendon.  This is moderately tender on palpation to him.  Thick yellow dystrophic nail hallux plate and the lesser digits right.  Assessment: Troy Jenkins bursitis right.  Nail dystrophy right.  Plan: Discussed etiology pathology and surgical therapies at this point I injected the bursa with 4 mg dexamethasone local anesthetic recommended anti-inflammatories and ice regularly also referred him to physical therapy.  Took samples of the nails to be sent for pathologic evaluation this is a new diagnosis.  We will consider MRI if he fails to improve.

## 2023-07-05 ENCOUNTER — Ambulatory Visit (HOSPITAL_COMMUNITY)
Admission: RE | Admit: 2023-07-05 | Discharge: 2023-07-05 | Disposition: A | Discharge status: Home / Self Care | Payer: Medicare Other | Source: Ambulatory Visit | Attending: Cardiovascular Disease | Admitting: Cardiovascular Disease

## 2023-07-05 ENCOUNTER — Other Ambulatory Visit (HOSPITAL_COMMUNITY): Payer: Self-pay | Admitting: *Deleted

## 2023-07-05 DIAGNOSIS — I6523 Occlusion and stenosis of bilateral carotid arteries: Secondary | ICD-10-CM | POA: Diagnosis not present

## 2023-07-05 DIAGNOSIS — E041 Nontoxic single thyroid nodule: Secondary | ICD-10-CM

## 2023-07-12 ENCOUNTER — Ambulatory Visit
Admission: RE | Admit: 2023-07-12 | Discharge: 2023-07-12 | Disposition: A | Discharge status: Home / Self Care | Payer: Medicare Other | Source: Ambulatory Visit | Attending: Internal Medicine | Admitting: Internal Medicine

## 2023-07-12 ENCOUNTER — Other Ambulatory Visit: Payer: Medicare Other

## 2023-07-12 DIAGNOSIS — E041 Nontoxic single thyroid nodule: Secondary | ICD-10-CM

## 2023-07-14 ENCOUNTER — Other Ambulatory Visit: Payer: Self-pay | Admitting: Internal Medicine

## 2023-07-18 ENCOUNTER — Other Ambulatory Visit: Payer: Self-pay | Admitting: Internal Medicine

## 2023-07-18 DIAGNOSIS — E041 Nontoxic single thyroid nodule: Secondary | ICD-10-CM

## 2023-07-24 ENCOUNTER — Other Ambulatory Visit: Payer: Self-pay

## 2023-07-24 ENCOUNTER — Ambulatory Visit (HOSPITAL_COMMUNITY): Payer: Medicare Other | Attending: Podiatry | Admitting: Physical Therapy

## 2023-07-24 ENCOUNTER — Encounter (HOSPITAL_COMMUNITY): Payer: Self-pay | Admitting: Physical Therapy

## 2023-07-24 DIAGNOSIS — M7660 Achilles tendinitis, unspecified leg: Secondary | ICD-10-CM | POA: Diagnosis not present

## 2023-07-24 DIAGNOSIS — M79671 Pain in right foot: Secondary | ICD-10-CM | POA: Diagnosis not present

## 2023-07-24 DIAGNOSIS — R262 Difficulty in walking, not elsewhere classified: Secondary | ICD-10-CM | POA: Diagnosis not present

## 2023-07-24 DIAGNOSIS — M6281 Muscle weakness (generalized): Secondary | ICD-10-CM | POA: Diagnosis not present

## 2023-07-24 NOTE — Therapy (Signed)
OUTPATIENT PHYSICAL THERAPY LOWER EXTREMITY EVALUATION   Patient Name: Troy Jenkins MRN: [id number] DOB:02-21-1941, 82 y.o., male Today's Date: 07/24/2023  END OF SESSION:  PT End of Session - 07/24/23 0945     Visit Number 1    Number of Visits 6    Date for PT Re-Evaluation 09/04/23    Authorization Type [employer] Medicare -- auth requested 07/24/23    Progress Note Due on Visit 10    PT Start Time 0946    PT Stop Time 1030    PT Time Calculation (min) 44 min    Activity Tolerance Patient tolerated treatment well    Behavior During Therapy [facility] for tasks assessed/performed             Past Medical History:  Diagnosis Date   Arthritis    generalized   Cancer (HCC) 2007   prostate   Cataract    not a surgical candidate at this time (03/07/2021)   GOUT 08/13/2007   HYPERLIPIDEMIA 08/13/2007   on meds   HYPERTENSION 08/13/2007   on meds   Ligament tear 06/22/12   left wrist    Skin cancer of arm 10/13   left arm; small skin cancer removed; low grade   VARIX, SCROTAL, LEFT 07/23/2008   Past Surgical History:  Procedure Laterality Date   COLONOSCOPY  07/2010   COLONOSCOPY  2017   JP-MAC-supre(exc)-SSP x3   HERNIA REPAIR  2010   Left    POLYPECTOMY  2017   SSP x3   PROSTATE SURGERY  12/2005   TREATMENT FISTULA ANAL  1980   XI ROBOTIC ASSISTED INGUINAL HERNIA REPAIR WITH MESH Right 11/15/2022   Procedure: XI ROBOTIC ASSISTED INGUINAL HERNIA REPAIR WITH MESH;  Surgeon: Lucretia Roers, MD;  Location: [facility];  Service: General;  Laterality: Right;   Patient Active Problem List   Diagnosis Date Noted   RUQ pain 12/28/2021   Hx of melanoma excision 12/28/2021   H/O prostate cancer 11/27/2013   Routine health maintenance 11/26/2011   Hyperlipidemia 08/13/2007   Gout 08/13/2007   Hypertension 08/13/2007    PCP: Myrlene Broker, MD  REFERRING PROVIDER: Elinor Parkinson, DPM  REFERRING DIAG: M76.60 (ICD-10-CM) - Achilles tendinitis, unspecified  laterality  THERAPY DIAG:  Pain in right foot  Muscle weakness (generalized)  Difficulty in walking, not elsewhere classified  Rationale for Evaluation and Treatment: Rehabilitation  ONSET DATE: ~2 months ago  SUBJECTIVE:   SUBJECTIVE STATEMENT: Pt states he has bursitis on the back of his heel and bone spurs. Pt states it bothers the right foot the most. Thinks it might have been brought on playing golf and walking a lot. Pt states he did get a shot in it which helped. Reports he has been careful with how much walking he does. States he has gotten new shoe inserts which has also helped.   PERTINENT HISTORY: none  PAIN:  Are you having pain? Yes: NPRS scale: 1 currently, at worst >8/10 Pain location: R heel Pain description: aching Aggravating factors: Increased walking >1 mile Relieving factors: Ice pack, injection  PRECAUTIONS: None  RED FLAGS: None   WEIGHT BEARING RESTRICTIONS: No  FALLS:  Has patient fallen in last 6 months? No  LIVING ENVIRONMENT: Lives with: lives with their spouse Lives in: House/apartment Stairs: Yes: Internal: 10 steps; on right going up Has following equipment at home: None  OCCUPATION: Retired -- gardening, bee keeping, golfing  PLOF: Independent  PATIENT GOALS: Improve pain to baseline levels  to return to golfing  NEXT MD VISIT: n/a  OBJECTIVE:   DIAGNOSTIC FINDINGS: x-rays indicating bone spurs  PATIENT SURVEYS:  Lower Extremity Functional Score: 55 / 80 = 68.8 %  COGNITION: Overall cognitive status: Within functional limits for tasks assessed     SENSATION: WFL  EDEMA:  None  MUSCLE LENGTH: Hamstrings: did not assess  POSTURE: No Significant postural limitations  PALPATION: TTP insertion of achilles tendon into calcaneus on R  LOWER EXTREMITY ROM:  Active ROM Right eval Left eval  Hip flexion    Hip extension    Hip abduction    Hip adduction    Hip internal rotation    Hip external rotation    Knee  flexion    Knee extension    Ankle dorsiflexion 10 17  Ankle plantarflexion 45 45  Ankle inversion 30 45  Ankle eversion 30 35   (Blank rows = not tested)  LOWER EXTREMITY MMT:  MMT Right eval Left eval  Hip flexion 5 5  Hip extension    Hip abduction    Hip adduction    Hip internal rotation    Hip external rotation    Knee flexion 5 5  Knee extension 5 5  Ankle dorsiflexion 5 5  Ankle plantarflexion 5 5  Ankle inversion 5 5  Ankle eversion 5 5   (Blank rows = not tested)  LOWER EXTREMITY SPECIAL TESTS:  Not tested  FUNCTIONAL TESTS:  L SLS: >30 sec, R SLS: 18.78 sec  GAIT: Distance walked: in clinic Assistive device utilized: None Level of assistance: Complete Independence Comments: normal pattern   TODAY'S TREATMENT:                                                                                                                              DATE: 07/24/23 See HEP below   PATIENT EDUCATION:  Education details: Exam findings, POC, initial HEP Person educated: Patient Education method: Explanation, Demonstration, and Handouts Education comprehension: verbalized understanding, returned demonstration, and needs further education  HOME EXERCISE PROGRAM: Access Code: [email or web address] URL: [email or web address] Date: 07/24/2023 Prepared by: Vernon Prey April Kirstie Peri  Exercises - Standing Gastroc Stretch  - 1 x daily - 7 x weekly - 3 sets - 1 min hold - Standing Soleus Stretch  - 1 x daily - 7 x weekly - 3 sets - 1 min hold - Heel Raise on Step  - 1 x daily - 7 x weekly - 1 sets - 10 reps - Standing Heel Raise with Toes Turned Out  - 1 x daily - 7 x weekly - 1 sets - 10 reps - Standing Heel Raise with Toes Turned In  - 1 x daily - 7 x weekly - 1 sets - 10 reps  ASSESSMENT:  CLINICAL IMPRESSION: Patient is a 82 y.o. y.o. male who was seen today for physical therapy evaluation and treatment for left plantar fasciitis. Assessment was significant for  decreased R ankle  ROM and diminished stability and endurance compared to L affecting activities such as standing and locomotion level resulting in diminished participation with shopping, community activity, yard work, and recreational activities such as golfing. Pt will benefit from PT to address these deficits for return to baseline status.   OBJECTIVE IMPAIRMENTS: decreased activity tolerance, decreased balance, decreased endurance, difficulty walking, decreased ROM, decreased strength, increased fascial restrictions, impaired flexibility, improper body mechanics, and pain.   ACTIVITY LIMITATIONS: standing and locomotion level  PARTICIPATION LIMITATIONS: shopping, community activity, yard work, and golfing  PERSONAL FACTORS: Age and Time since onset of injury/illness/exacerbation are also affecting patient's functional outcome.   REHAB POTENTIAL: Good  CLINICAL DECISION MAKING: Stable/uncomplicated  EVALUATION COMPLEXITY: Low   GOALS: Goals reviewed with patient? Yes  SHORT TERM GOALS: Target date: 08/14/2023  Pt will be ind with HEP Baseline: Goal status: INITIAL  LONG TERM GOALS: Target date: 09/04/2023   Pt will demo L = R ankle ROM Baseline:  Goal status: INITIAL  2.  Pt will demo L = R SLS for improved balance and stability with recreational tasks Baseline:  Goal status: INITIAL  3.  Pt will have improved LEFS to at least 78.8% to demo MCID Baseline:  Goal status: INITIAL  4.  Pt will be able to return to walking at least 2 miles with <1/10 pain Baseline:  Goal status: INITIAL   PLAN:  PT FREQUENCY: 1x/week  PT DURATION: 6 weeks  PLANNED INTERVENTIONS: Therapeutic exercises, Therapeutic activity, Neuromuscular re-education, Balance training, Gait training, Patient/Family education, Self Care, Joint mobilization, Stair training, Dry Needling, Electrical stimulation, Cryotherapy, Moist heat, Taping, Vasopneumatic device, Ultrasound, Ionotophoresis 4mg /ml  Dexamethasone, Manual therapy, and Re-evaluation  PLAN FOR NEXT SESSION: Assess response to HEP. Continue Achilles tendon stretching/strengthening, single leg stability, endurance   Kathey Simer April Ma L Ausha Sieh, PT 07/24/2023, 12:42 PM

## 2023-07-30 ENCOUNTER — Encounter (HOSPITAL_COMMUNITY): Payer: Medicare Other

## 2023-07-31 ENCOUNTER — Ambulatory Visit (HOSPITAL_COMMUNITY): Payer: Medicare Other

## 2023-07-31 DIAGNOSIS — R262 Difficulty in walking, not elsewhere classified: Secondary | ICD-10-CM | POA: Diagnosis not present

## 2023-07-31 DIAGNOSIS — M6281 Muscle weakness (generalized): Secondary | ICD-10-CM | POA: Diagnosis not present

## 2023-07-31 DIAGNOSIS — M79671 Pain in right foot: Secondary | ICD-10-CM | POA: Diagnosis not present

## 2023-07-31 DIAGNOSIS — M7660 Achilles tendinitis, unspecified leg: Secondary | ICD-10-CM | POA: Diagnosis not present

## 2023-07-31 NOTE — Therapy (Signed)
OUTPATIENT PHYSICAL THERAPY LOWER EXTREMITY TREATMENT   Patient Name: Troy Jenkins MRN: [id number] DOB:10/09/1941, 82 y.o., male Today's Date: 07/31/2023  END OF SESSION:  PT End of Session - 07/31/23 0803     Visit Number 2    Number of Visits 6    Date for PT Re-Evaluation 09/04/23    Authorization Type U[employer] Medicare (approved 6 visits)    Authorization Time Period 07/24/23-09/04/23    Authorization - Visit Number 1    Authorization - Number of Visits 6    Progress Note Due on Visit 10    PT Start Time 0800    PT Stop Time 0845    PT Time Calculation (min) 45 min    Activity Tolerance Patient tolerated treatment well    Behavior During Therapy [facility] for tasks assessed/performed             Past Medical History:  Diagnosis Date   Arthritis    generalized   Cancer (HCC) 2007   prostate   Cataract    not a surgical candidate at this time (03/07/2021)   GOUT 08/13/2007   HYPERLIPIDEMIA 08/13/2007   on meds   HYPERTENSION 08/13/2007   on meds   Ligament tear 06/22/12   left wrist    Skin cancer of arm 10/13   left arm; small skin cancer removed; low grade   VARIX, SCROTAL, LEFT 07/23/2008   Past Surgical History:  Procedure Laterality Date   COLONOSCOPY  07/2010   COLONOSCOPY  2017   JP-MAC-supre(exc)-SSP x3   HERNIA REPAIR  2010   Left    POLYPECTOMY  2017   SSP x3   PROSTATE SURGERY  12/2005   TREATMENT FISTULA ANAL  1980   XI ROBOTIC ASSISTED INGUINAL HERNIA REPAIR WITH MESH Right 11/15/2022   Procedure: XI ROBOTIC ASSISTED INGUINAL HERNIA REPAIR WITH MESH;  Surgeon: Lucretia Roers, MD;  Location: [facility];  Service: General;  Laterality: Right;   Patient Active Problem List   Diagnosis Date Noted   RUQ pain 12/28/2021   Hx of melanoma excision 12/28/2021   H/O prostate cancer 11/27/2013   Routine health maintenance 11/26/2011   Hyperlipidemia 08/13/2007   Gout 08/13/2007   Hypertension 08/13/2007    PCP: Myrlene Broker, MD  REFERRING  PROVIDER: Elinor Parkinson, DPM  REFERRING DIAG: M76.60 (ICD-10-CM) - Achilles tendinitis, unspecified laterality  THERAPY DIAG:  No diagnosis found.  Rationale for Evaluation and Treatment: Rehabilitation  ONSET DATE: ~2 months ago  SUBJECTIVE:   SUBJECTIVE STATEMENT: Patient denies any pain today. Reports that he's having some hard time with the heel raises as his calves can get sore.  EVAL: Pt states he has bursitis on the back of his heel and bone spurs. Pt states it bothers the right foot the most. Thinks it might have been brought on playing golf and walking a lot. Pt states he did get a shot in it which helped. Reports he has been careful with how much walking he does. States he has gotten new shoe inserts which has also helped.   PERTINENT HISTORY: none  PAIN:  Are you having pain? Yes: NPRS scale: 1 currently, at worst >8/10 Pain location: R heel Pain description: aching Aggravating factors: Increased walking >1 mile Relieving factors: Ice pack, injection  PRECAUTIONS: None  RED FLAGS: None   WEIGHT BEARING RESTRICTIONS: No  FALLS:  Has patient fallen in last 6 months? No  LIVING ENVIRONMENT: Lives with: lives with their spouse Lives in:  House/apartment Stairs: Yes: Internal: 10 steps; on right going up Has following equipment at home: None  OCCUPATION: Retired -- gardening, bee keeping, golfing  PLOF: Independent  PATIENT GOALS: Improve pain to baseline levels to return to golfing  NEXT MD VISIT: n/a  OBJECTIVE:   DIAGNOSTIC FINDINGS: x-rays indicating bone spurs  PATIENT SURVEYS:  Lower Extremity Functional Score: 55 / 80 = 68.8 %  COGNITION: Overall cognitive status: Within functional limits for tasks assessed     SENSATION: WFL  EDEMA:  None  MUSCLE LENGTH: Hamstrings: did not assess  POSTURE: No Significant postural limitations  PALPATION: TTP insertion of achilles tendon into calcaneus on R  LOWER EXTREMITY ROM:  Active ROM  Right eval Left eval  Hip flexion    Hip extension    Hip abduction    Hip adduction    Hip internal rotation    Hip external rotation    Knee flexion    Knee extension    Ankle dorsiflexion 10 17  Ankle plantarflexion 45 45  Ankle inversion 30 45  Ankle eversion 30 35   (Blank rows = not tested)  LOWER EXTREMITY MMT:  MMT Right eval Left eval  Hip flexion 5 5  Hip extension    Hip abduction    Hip adduction    Hip internal rotation    Hip external rotation    Knee flexion 5 5  Knee extension 5 5  Ankle dorsiflexion 5 5  Ankle plantarflexion 5 5  Ankle inversion 5 5  Ankle eversion 5 5   (Blank rows = not tested)  LOWER EXTREMITY SPECIAL TESTS:  Not tested  FUNCTIONAL TESTS:  L SLS: >30 sec, R SLS: 18.78 sec  GAIT: Distance walked: in clinic Assistive device utilized: None Level of assistance: Complete Independence Comments: normal pattern   TODAY'S TREATMENT:                                                                                                                              DATE:  07/31/23 Standing gastrocnemius slant board stretch x 30" x 3 Standing soleus slant board stretch x 30" x 3 Standing heel raises with toes out x 10 x 2 Mini squats on a counter with adductor squeeze x 3" x 10 Seated plantar fascia stretch x 30" x 2 Seated plantar fascia self-mob with a ball x 1' each Seated ankle DF, eversion, RTB x 10 x 2 Seated piriformis stretch x 30" x 2   07/24/23 See HEP below   PATIENT EDUCATION:  Education details: Written HEP updated, provided and reviewed Person educated: Patient Education method: Explanation, Demonstration, and Handouts Education comprehension: verbalized understanding, returned demonstration, and needs further education  HOME EXERCISE PROGRAM: Access Code: [email or web address] URL: [email or web address] 07/31/23 - Seated Plantar Fascia Stretch  - 1-2 x daily - 7 x weekly - 3 reps - 30 hold - Seated Plantar  Fascia Mobilization with Small Ball  - 1-2 x daily -  7 x weekly - Seated Ankle Dorsiflexion with Resistance  - 1-2 x daily - 7 x weekly - 2 sets - 10 reps - Seated Ankle Eversion with Resistance  - 1-2 x daily - 7 x weekly - 2 sets - 10 reps - Seated Piriformis Stretch  - 1-2 x daily - 7 x weekly - 3 reps - 30 hold - Mini Squat with Counter Support  - 1-2 x daily - 7 x weekly - 2 sets - 10 reps - 3 hold  Date: 07/24/2023 Prepared by: Vernon Prey April Kirstie Peri  Exercises - Standing Gastroc Stretch  - 1 x daily - 7 x weekly - 3 sets - 1 min hold - Standing Soleus Stretch  - 1 x daily - 7 x weekly - 3 sets - 1 min hold - Heel Raise on Step  - 1 x daily - 7 x weekly - 1 sets - 10 reps - Standing Heel Raise with Toes Turned Out  - 1 x daily - 7 x weekly - 1 sets - 10 reps - Standing Heel Raise with Toes Turned In  - 1 x daily - 7 x weekly - 1 sets - 10 reps  ASSESSMENT:  CLINICAL IMPRESSION: Interventions today were geared towards LE strengthening and mobility. Tolerated all activities without worsening of symptoms. Demonstrated appropriate levels of fatigue. Provided slight amount of cueing to ensure correct execution of activity with good carry-over. To date, skilled PT is required to address the impairments and improve function.  EVAL: Patient is a 82 y.o. y.o. male who was seen today for physical therapy evaluation and treatment for left plantar fasciitis. Assessment was significant for decreased R ankle ROM and diminished stability and endurance compared to L affecting activities such as standing and locomotion level resulting in diminished participation with shopping, community activity, yard work, and recreational activities such as golfing. Pt will benefit from PT to address these deficits for return to baseline status.   OBJECTIVE IMPAIRMENTS: decreased activity tolerance, decreased balance, decreased endurance, difficulty walking, decreased ROM, decreased strength, increased fascial  restrictions, impaired flexibility, improper body mechanics, and pain.   ACTIVITY LIMITATIONS: standing and locomotion level  PARTICIPATION LIMITATIONS: shopping, community activity, yard work, and golfing  PERSONAL FACTORS: Age and Time since onset of injury/illness/exacerbation are also affecting patient's functional outcome.   REHAB POTENTIAL: Good  CLINICAL DECISION MAKING: Stable/uncomplicated  EVALUATION COMPLEXITY: Low   GOALS: Goals reviewed with patient? Yes  SHORT TERM GOALS: Target date: 08/14/2023  Pt will be ind with HEP Baseline: Goal status: INITIAL  LONG TERM GOALS: Target date: 09/04/2023   Pt will demo L = R ankle ROM Baseline:  Goal status: INITIAL  2.  Pt will demo L = R SLS for improved balance and stability with recreational tasks Baseline:  Goal status: INITIAL  3.  Pt will have improved LEFS to at least 78.8% to demo MCID Baseline:  Goal status: INITIAL  4.  Pt will be able to return to walking at least 2 miles with <1/10 pain Baseline:  Goal status: INITIAL   PLAN:  PT FREQUENCY: 1x/week  PT DURATION: 6 weeks  PLANNED INTERVENTIONS: Therapeutic exercises, Therapeutic activity, Neuromuscular re-education, Balance training, Gait training, Patient/Family education, Self Care, Joint mobilization, Stair training, Dry Needling, Electrical stimulation, Cryotherapy, Moist heat, Taping, Vasopneumatic device, Ultrasound, Ionotophoresis 4mg /ml Dexamethasone, Manual therapy, and Re-evaluation  PLAN FOR NEXT SESSION: Continue POC and may progress as tolerated with emphasis on Achilles tendon stretching/strengthening, single leg stability, endurance  Tish Frederickson. Marlyn Tondreau, PT, DPT, OCS Board-Certified Clinical Specialist in Orthopedic PT PT Compact Privilege # ([place]): [id number]  07/31/2023, 8:03 AM

## 2023-08-07 ENCOUNTER — Ambulatory Visit (HOSPITAL_COMMUNITY): Payer: Medicare Other

## 2023-08-07 DIAGNOSIS — M79671 Pain in right foot: Secondary | ICD-10-CM | POA: Diagnosis not present

## 2023-08-07 DIAGNOSIS — M6281 Muscle weakness (generalized): Secondary | ICD-10-CM

## 2023-08-07 DIAGNOSIS — R262 Difficulty in walking, not elsewhere classified: Secondary | ICD-10-CM | POA: Diagnosis not present

## 2023-08-07 DIAGNOSIS — M7660 Achilles tendinitis, unspecified leg: Secondary | ICD-10-CM | POA: Diagnosis not present

## 2023-08-07 NOTE — Therapy (Signed)
OUTPATIENT PHYSICAL THERAPY LOWER EXTREMITY TREATMENT   Patient Name: Troy Jenkins MRN: [id number] DOB:[birth year], 82 y.o., male Today's Date: 08/07/2023  END OF SESSION:  PT End of Session - 08/07/23 0851     Visit Number 3    Number of Visits 6    Date for PT Re-Evaluation 09/04/23    Authorization Type [employer] Medicare (approved 6 visits)    Authorization Time Period 07/24/23-09/04/23    Authorization - Visit Number 2    Authorization - Number of Visits 6    Progress Note Due on Visit 10    PT Start Time 0845    PT Stop Time 0930    PT Time Calculation (min) 45 min    Activity Tolerance Patient tolerated treatment well    Behavior During Therapy [facility] for tasks assessed/performed            Past Medical History:  Diagnosis Date   Arthritis    generalized   Cancer (HCC) 2007   prostate   Cataract    not a surgical candidate at this time (03/07/2021)   GOUT 08/13/2007   HYPERLIPIDEMIA 08/13/2007   on meds   HYPERTENSION 08/13/2007   on meds   Ligament tear 06/22/12   left wrist    Skin cancer of arm 10/13   left arm; small skin cancer removed; low grade   VARIX, SCROTAL, LEFT 07/23/2008   Past Surgical History:  Procedure Laterality Date   COLONOSCOPY  07/2010   COLONOSCOPY  2017   JP-MAC-supre(exc)-SSP x3   HERNIA REPAIR  2010   Left    POLYPECTOMY  2017   SSP x3   PROSTATE SURGERY  12/2005   TREATMENT FISTULA ANAL  1980   XI ROBOTIC ASSISTED INGUINAL HERNIA REPAIR WITH MESH Right 11/15/2022   Procedure: XI ROBOTIC ASSISTED INGUINAL HERNIA REPAIR WITH MESH;  Surgeon: Lucretia Roers, MD;  Location: [facility];  Service: General;  Laterality: Right;   Patient Active Problem List   Diagnosis Date Noted   RUQ pain 12/28/2021   Hx of melanoma excision 12/28/2021   H/O prostate cancer 11/27/2013   Routine health maintenance 11/26/2011   Hyperlipidemia 08/13/2007   Gout 08/13/2007   Hypertension 08/13/2007    PCP: Myrlene Broker, MD  REFERRING  PROVIDER: Elinor Parkinson, DPM  REFERRING DIAG: M76.60 (ICD-10-CM) - Achilles tendinitis, unspecified laterality  THERAPY DIAG:  Pain in right foot  Muscle weakness (generalized)  Difficulty in walking, not elsewhere classified  Rationale for Evaluation and Treatment: Rehabilitation  ONSET DATE: ~2 months ago  SUBJECTIVE:   SUBJECTIVE STATEMENT: Patient is doing well today. Patient states that he doesn't have any issues with the HEP. Denies pain any pain and was able to tolerate prolonged hours of standing while [place] the golf [place] yesterday.  EVAL: Pt states he has bursitis on the back of his heel and bone spurs. Pt states it bothers the right foot the most. Thinks it might have been brought on playing golf and walking a lot. Pt states he did get a shot in it which helped. Reports he has been careful with how much walking he does. States he has gotten new shoe inserts which has also helped.   PERTINENT HISTORY: none  PAIN:  Are you having pain? Yes: NPRS scale: 1 currently, at worst >8/10 Pain location: R heel Pain description: aching Aggravating factors: Increased walking >1 mile Relieving factors: Ice pack, injection  PRECAUTIONS: None  RED FLAGS: None   WEIGHT BEARING  RESTRICTIONS: No  FALLS:  Has patient fallen in last 6 months? No  LIVING ENVIRONMENT: Lives with: lives with their spouse Lives in: House/apartment Stairs: Yes: Internal: 10 steps; on right going up Has following equipment at home: None  OCCUPATION: Retired -- gardening, bee keeping, golfing  PLOF: Independent  PATIENT GOALS: Improve pain to baseline levels to return to golfing  NEXT MD VISIT: n/a  OBJECTIVE:   DIAGNOSTIC FINDINGS: x-rays indicating bone spurs  PATIENT SURVEYS:  Lower Extremity Functional Score: 55 / 80 = 68.8 %  COGNITION: Overall cognitive status: Within functional limits for tasks assessed     SENSATION: WFL  EDEMA:  None  MUSCLE LENGTH: Hamstrings: did not  assess  POSTURE: No Significant postural limitations  PALPATION: TTP insertion of achilles tendon into calcaneus on R  LOWER EXTREMITY ROM:  Active ROM Right eval Left eval  Hip flexion    Hip extension    Hip abduction    Hip adduction    Hip internal rotation    Hip external rotation    Knee flexion    Knee extension    Ankle dorsiflexion 10 17  Ankle plantarflexion 45 45  Ankle inversion 30 45  Ankle eversion 30 35   (Blank rows = not tested)  LOWER EXTREMITY MMT:  MMT Right eval Left eval  Hip flexion 5 5  Hip extension    Hip abduction    Hip adduction    Hip internal rotation    Hip external rotation    Knee flexion 5 5  Knee extension 5 5  Ankle dorsiflexion 5 5  Ankle plantarflexion 5 5  Ankle inversion 5 5  Ankle eversion 5 5   (Blank rows = not tested)  LOWER EXTREMITY SPECIAL TESTS:  Not tested  FUNCTIONAL TESTS:  L SLS: >30 sec, R SLS: 18.78 sec  GAIT: Distance walked: in clinic Assistive device utilized: None Level of assistance: Complete Independence Comments: normal pattern   TODAY'S TREATMENT:                                                                                                                              DATE:  08/07/23 Standing gastrocnemius slant board stretch x 30" x 3 Standing soleus slant board stretch x 30" x 3 Standing heel raises with toes out x 10 x 2 x 2 lbs Mini squats on a counter with adductor squeeze x 3" x 10 Seated plantar fascia stretch x 30" x 3 Seated plantar fascia self-mob with a ball x 1' each Seated ankle DF, eversion,  green TB x 10 x 2 Seated toe yoga x 10 x 2 Seated piriformis stretch x 30" x 2  07/31/23 Standing gastrocnemius slant board stretch x 30" x 3 Standing soleus slant board stretch x 30" x 3 Standing heel raises with toes out x 10 x 2 Mini squats on a counter with adductor squeeze x 3" x 10 Seated plantar fascia stretch x  30" x 2 Seated plantar fascia self-mob with a ball x 1'  each Seated ankle DF, eversion, RTB x 10 x 2 Seated piriformis stretch x 30" x 2   07/24/23 See HEP below   PATIENT EDUCATION:  Education details: Written HEP updated, provided and reviewed Person educated: Patient Education method: Explanation, Demonstration, and Handouts Education comprehension: verbalized understanding, returned demonstration, and needs further education  HOME EXERCISE PROGRAM: Access Code: [email or web address] URL: [email or web address] 08/07/23 - Toe Yoga - Alternating Great Toe and Lesser Toe Extension  - 1-2 x daily - 7 x weekly - 2 sets - 10 reps  07/31/23 - Seated Plantar Fascia Stretch  - 1-2 x daily - 7 x weekly - 3 reps - 30 hold - Seated Plantar Fascia Mobilization with Small Ball  - 1-2 x daily - 7 x weekly - Seated Ankle Dorsiflexion with Resistance  - 1-2 x daily - 7 x weekly - 2 sets - 10 reps - Seated Ankle Eversion with Resistance  - 1-2 x daily - 7 x weekly - 2 sets - 10 reps - Seated Piriformis Stretch  - 1-2 x daily - 7 x weekly - 3 reps - 30 hold - Mini Squat with Counter Support  - 1-2 x daily - 7 x weekly - 2 sets - 10 reps - 3 hold  Date: 07/24/2023 Prepared by: Vernon Prey April Kirstie Peri  Exercises - Standing Gastroc Stretch  - 1 x daily - 7 x weekly - 3 sets - 1 min hold - Standing Soleus Stretch  - 1 x daily - 7 x weekly - 3 sets - 1 min hold - Heel Raise on Step  - 1 x daily - 7 x weekly - 1 sets - 10 reps - Standing Heel Raise with Toes Turned Out  - 1 x daily - 7 x weekly - 1 sets - 10 reps - Standing Heel Raise with Toes Turned In  - 1 x daily - 7 x weekly - 1 sets - 10 reps  ASSESSMENT:  CLINICAL IMPRESSION: Interventions today were geared towards LE strengthening and mobility. Tolerated all activities without worsening of symptoms including the progression. Moderate difficulty lifting the big toe during toe yoga. Demonstrated appropriate levels of fatigue. Provided little to no amount of cueing to ensure correct execution of  activity with good carry-over. To date, skilled PT is required to address the impairments and improve function.  EVAL: Patient is a 82 y.o. y.o. male who was seen today for physical therapy evaluation and treatment for left plantar fasciitis. Assessment was significant for decreased R ankle ROM and diminished stability and endurance compared to L affecting activities such as standing and locomotion level resulting in diminished participation with shopping, community activity, yard work, and recreational activities such as golfing. Pt will benefit from PT to address these deficits for return to baseline status.   OBJECTIVE IMPAIRMENTS: decreased activity tolerance, decreased balance, decreased endurance, difficulty walking, decreased ROM, decreased strength, increased fascial restrictions, impaired flexibility, improper body mechanics, and pain.   ACTIVITY LIMITATIONS: standing and locomotion level  PARTICIPATION LIMITATIONS: shopping, community activity, yard work, and golfing  PERSONAL FACTORS: Age and Time since onset of injury/illness/exacerbation are also affecting patient's functional outcome.   REHAB POTENTIAL: Good  CLINICAL DECISION MAKING: Stable/uncomplicated  EVALUATION COMPLEXITY: Low   GOALS: Goals reviewed with patient? Yes  SHORT TERM GOALS: Target date: 08/14/2023  Pt will be ind with HEP Baseline: Goal status: INITIAL  LONG TERM GOALS: Target date: 09/04/2023  Pt will demo L = R ankle ROM Baseline:  Goal status: INITIAL  2.  Pt will demo L = R SLS for improved balance and stability with recreational tasks Baseline:  Goal status: INITIAL  3.  Pt will have improved LEFS to at least 78.8% to demo MCID Baseline:  Goal status: INITIAL  4.  Pt will be able to return to walking at least 2 miles with <1/10 pain Baseline:  Goal status: INITIAL   PLAN:  PT FREQUENCY: 1x/week  PT DURATION: 6 weeks  PLANNED INTERVENTIONS: Therapeutic exercises, Therapeutic  activity, Neuromuscular re-education, Balance training, Gait training, Patient/Family education, Self Care, Joint mobilization, Stair training, Dry Needling, Electrical stimulation, Cryotherapy, Moist heat, Taping, Vasopneumatic device, Ultrasound, Ionotophoresis 4mg /ml Dexamethasone, Manual therapy, and Re-evaluation  PLAN FOR NEXT SESSION: Continue POC and may progress as tolerated with emphasis on Achilles tendon stretching/strengthening, single leg stability, endurance   Iantha Fallen L. Romario Tith, PT, DPT, OCS Board-Certified Clinical Specialist in Orthopedic PT PT Compact Privilege # ([place]): [phone]  08/07/2023, 9:31 AM

## 2023-08-13 ENCOUNTER — Ambulatory Visit (HOSPITAL_COMMUNITY): Payer: Medicare Other | Admitting: Physical Therapy

## 2023-08-13 ENCOUNTER — Encounter (HOSPITAL_COMMUNITY): Payer: Self-pay | Admitting: Physical Therapy

## 2023-08-13 DIAGNOSIS — M6281 Muscle weakness (generalized): Secondary | ICD-10-CM | POA: Diagnosis not present

## 2023-08-13 DIAGNOSIS — R262 Difficulty in walking, not elsewhere classified: Secondary | ICD-10-CM | POA: Diagnosis not present

## 2023-08-13 DIAGNOSIS — M79671 Pain in right foot: Secondary | ICD-10-CM

## 2023-08-13 DIAGNOSIS — M7660 Achilles tendinitis, unspecified leg: Secondary | ICD-10-CM | POA: Diagnosis not present

## 2023-08-13 NOTE — Therapy (Signed)
OUTPATIENT PHYSICAL THERAPY LOWER EXTREMITY TREATMENT   Patient Name: Troy Jenkins MRN: [id number] DOB:March 03, 1941, 82 y.o., male Today's Date: 08/13/2023  END OF SESSION:  PT End of Session - 08/13/23 0933     Visit Number 4    Number of Visits 6    Date for PT Re-Evaluation 09/04/23    Authorization Type [employer] Medicare (approved 6 visits)    Authorization Time Period 07/24/23-09/04/23    Authorization - Visit Number 4    Authorization - Number of Visits 6    Progress Note Due on Visit 10    PT Start Time 0930    PT Stop Time 1010    PT Time Calculation (min) 40 min    Activity Tolerance Patient tolerated treatment well    Behavior During Therapy [facility] for tasks assessed/performed             Past Medical History:  Diagnosis Date   Arthritis    generalized   Cancer (HCC) 2007   prostate   Cataract    not a surgical candidate at this time (03/07/2021)   GOUT 08/13/2007   HYPERLIPIDEMIA 08/13/2007   on meds   HYPERTENSION 08/13/2007   on meds   Ligament tear 06/22/12   left wrist    Skin cancer of arm 10/13   left arm; small skin cancer removed; low grade   VARIX, SCROTAL, LEFT 07/23/2008   Past Surgical History:  Procedure Laterality Date   COLONOSCOPY  07/2010   COLONOSCOPY  2017   JP-MAC-supre(exc)-SSP x3   HERNIA REPAIR  2010   Left    POLYPECTOMY  2017   SSP x3   PROSTATE SURGERY  12/2005   TREATMENT FISTULA ANAL  1980   XI ROBOTIC ASSISTED INGUINAL HERNIA REPAIR WITH MESH Right 11/15/2022   Procedure: XI ROBOTIC ASSISTED INGUINAL HERNIA REPAIR WITH MESH;  Surgeon: Lucretia Roers, MD;  Location: [facility];  Service: General;  Laterality: Right;   Patient Active Problem List   Diagnosis Date Noted   RUQ pain 12/28/2021   Hx of melanoma excision 12/28/2021   H/O prostate cancer 11/27/2013   Routine health maintenance 11/26/2011   Hyperlipidemia 08/13/2007   Gout 08/13/2007   Hypertension 08/13/2007    PCP: Myrlene Broker, MD  REFERRING  PROVIDER: Elinor Parkinson, DPM  REFERRING DIAG: M76.60 (ICD-10-CM) - Achilles tendinitis, unspecified laterality  THERAPY DIAG:  Pain in right foot  Muscle weakness (generalized)  Difficulty in walking, not elsewhere classified  Rationale for Evaluation and Treatment: Rehabilitation  ONSET DATE: ~2 months ago  SUBJECTIVE:   SUBJECTIVE STATEMENT: Pt states he can barely walk this morning. Increased pain today -- thinks it may have been from trying to pick up debris for ~1 hour in his yard during the weekend. Tried to rest it yesterday and only do some stretches.   EVAL: Pt states he has bursitis on the back of his heel and bone spurs. Pt states it bothers the right foot the most. Thinks it might have been brought on playing golf and walking a lot. Pt states he did get a shot in it which helped. Reports he has been careful with how much walking he does. States he has gotten new shoe inserts which has also helped.   PERTINENT HISTORY: none  PAIN:  Are you having pain? Yes: NPRS scale: 1 currently, at worst >8/10 Pain location: R heel Pain description: aching Aggravating factors: Increased walking >1 mile Relieving factors: Ice pack, injection  PRECAUTIONS:  None  RED FLAGS: None   WEIGHT BEARING RESTRICTIONS: No  FALLS:  Has patient fallen in last 6 months? No  LIVING ENVIRONMENT: Lives with: lives with their spouse Lives in: House/apartment Stairs: Yes: Internal: 10 steps; on right going up Has following equipment at home: None  OCCUPATION: Retired -- gardening, bee keeping, golfing  PLOF: Independent  PATIENT GOALS: Improve pain to baseline levels to return to golfing  NEXT MD VISIT: n/a  OBJECTIVE:   DIAGNOSTIC FINDINGS: x-rays indicating bone spurs  PATIENT SURVEYS:  Lower Extremity Functional Score: 55 / 80 = 68.8 %  COGNITION: Overall cognitive status: Within functional limits for tasks assessed     SENSATION: WFL  EDEMA:  None  MUSCLE  LENGTH: Hamstrings: did not assess  POSTURE: No Significant postural limitations  PALPATION: TTP insertion of achilles tendon into calcaneus on R  LOWER EXTREMITY ROM:  Active ROM Right eval Left eval  Hip flexion    Hip extension    Hip abduction    Hip adduction    Hip internal rotation    Hip external rotation    Knee flexion    Knee extension    Ankle dorsiflexion 10 17  Ankle plantarflexion 45 45  Ankle inversion 30 45  Ankle eversion 30 35   (Blank rows = not tested)  LOWER EXTREMITY MMT:  MMT Right eval Left eval  Hip flexion 5 5  Hip extension    Hip abduction    Hip adduction    Hip internal rotation    Hip external rotation    Knee flexion 5 5  Knee extension 5 5  Ankle dorsiflexion 5 5  Ankle plantarflexion 5 5  Ankle inversion 5 5  Ankle eversion 5 5   (Blank rows = not tested)  LOWER EXTREMITY SPECIAL TESTS:  Not tested  FUNCTIONAL TESTS:  L SLS: >30 sec, R SLS: 18.78 sec  GAIT: Distance walked: in clinic Assistive device utilized: None Level of assistance: Complete Independence Comments: normal pattern   TODAY'S TREATMENT:                                                                                                                              DATE:  08/13/23 Nustep L4 x 5 min Manual therapy:  IASTM, STM & TPR R gastroc/soleus  Skilled assessment and palpation for TPDN  Trigger Point Dry-Needling  Treatment instructions: Expect mild to moderate muscle soreness. S/S of pneumothorax if dry needled over a lung field, and to seek immediate medical attention should they occur. Patient verbalized understanding of these instructions and education.  Patient Consent Given: Yes Education handout provided: Yes Muscles treated: R gastroc Electrical stimulation performed: No Parameters: N/A Treatment response/outcome: Twitch response and decreased muscle tension Prone  Soleus stretch with strap x 30"  Gastroc stretch with strap x  30"  Knee flexion/ext with ankle DF x10 Standing  Gastroc stretch 2x 30"  Soleus stretch 2x30"  Heel raises  toes out and then toes in 2x10  Toe raises with toes out and then toes in 2x10    08/07/23 Standing gastrocnemius slant board stretch x 30" x 3 Standing soleus slant board stretch x 30" x 3 Standing heel raises with toes out x 10 x 2 x 2 lbs Mini squats on a counter with adductor squeeze x 3" x 10 Seated plantar fascia stretch x 30" x 3 Seated plantar fascia self-mob with a ball x 1' each Seated ankle DF, eversion,  green TB x 10 x 2 Seated toe yoga x 10 x 2 Seated piriformis stretch x 30" x 2  07/31/23 Standing gastrocnemius slant board stretch x 30" x 3 Standing soleus slant board stretch x 30" x 3 Standing heel raises with toes out x 10 x 2 Mini squats on a counter with adductor squeeze x 3" x 10 Seated plantar fascia stretch x 30" x 2 Seated plantar fascia self-mob with a ball x 1' each Seated ankle DF, eversion, RTB x 10 x 2 Seated piriformis stretch x 30" x 2   07/24/23 See HEP below   PATIENT EDUCATION:  Education details: Written HEP updated, provided and reviewed Person educated: Patient Education method: Explanation, Demonstration, and Handouts Education comprehension: verbalized understanding, returned demonstration, and needs further education  HOME EXERCISE PROGRAM: Access Code: [email or web address] URL: [email or web address] 08/07/23 - Toe Yoga - Alternating Great Toe and Lesser Toe Extension  - 1-2 x daily - 7 x weekly - 2 sets - 10 reps  07/31/23 - Seated Plantar Fascia Stretch  - 1-2 x daily - 7 x weekly - 3 reps - 30 hold - Seated Plantar Fascia Mobilization with Small Ball  - 1-2 x daily - 7 x weekly - Seated Ankle Dorsiflexion with Resistance  - 1-2 x daily - 7 x weekly - 2 sets - 10 reps - Seated Ankle Eversion with Resistance  - 1-2 x daily - 7 x weekly - 2 sets - 10 reps - Seated Piriformis Stretch  - 1-2 x daily - 7 x weekly - 3 reps - 30  hold - Mini Squat with Counter Support  - 1-2 x daily - 7 x weekly - 2 sets - 10 reps - 3 hold  Date: 07/24/2023 Prepared by: Vernon Prey April Kirstie Peri  Exercises - Standing Gastroc Stretch  - 1 x daily - 7 x weekly - 3 sets - 1 min hold - Standing Soleus Stretch  - 1 x daily - 7 x weekly - 3 sets - 1 min hold - Heel Raise on Step  - 1 x daily - 7 x weekly - 1 sets - 10 reps - Standing Heel Raise with Toes Turned Out  - 1 x daily - 7 x weekly - 1 sets - 10 reps - Standing Heel Raise with Toes Turned In  - 1 x daily - 7 x weekly - 1 sets - 10 reps  ASSESSMENT:  CLINICAL IMPRESSION: Pt comes in with increased flare up of his achilles bursitis. 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 Worked in standing for strengthening within pain free range. PT to continue to work and progress foot/ankle strengthening to reduce strain/tightness in his gastroc/soleus next session.  EVAL: Patient is a 82 y.o. y.o. male who was seen today for physical therapy evaluation and treatment for left achilles tendonitis. Assessment was significant for decreased R ankle ROM and diminished stability and endurance compared to L affecting activities such as standing  and locomotion level resulting in diminished participation with shopping, community activity, yard work, and recreational activities such as golfing. Pt will benefit from PT to address these deficits for return to baseline status.   OBJECTIVE IMPAIRMENTS: decreased activity tolerance, decreased balance, decreased endurance, difficulty walking, decreased ROM, decreased strength, increased fascial restrictions, impaired flexibility, improper body mechanics, and pain.     GOALS: Goals reviewed with patient? Yes  SHORT TERM GOALS: Target date: 08/14/2023  Pt will be ind with HEP Baseline: Goal status: INITIAL  LONG TERM GOALS: Target date: 09/04/2023   Pt will demo L = R ankle ROM Baseline:  Goal  status: INITIAL  2.  Pt will demo L = R SLS for improved balance and stability with recreational tasks Baseline:  Goal status: INITIAL  3.  Pt will have improved LEFS to at least 78.8% to demo MCID Baseline:  Goal status: INITIAL  4.  Pt will be able to return to walking at least 2 miles with <1/10 pain Baseline:  Goal status: INITIAL   PLAN:  PT FREQUENCY: 1x/week  PT DURATION: 6 weeks  PLANNED INTERVENTIONS: Therapeutic exercises, Therapeutic activity, Neuromuscular re-education, Balance training, Gait training, Patient/Family education, Self Care, Joint mobilization, Stair training, Dry Needling, Electrical stimulation, Cryotherapy, Moist heat, Taping, Vasopneumatic device, Ultrasound, Ionotophoresis 4mg /ml Dexamethasone, Manual therapy, and Re-evaluation  PLAN FOR NEXT SESSION: Intrinsic foot strengthening (consider working on foam). Continue POC and may progress as tolerated with emphasis on gastroc/soleus stretching/strengthening, single leg stability, endurance,    Farron Lafond April Ma L Dorena Dorfman, PT 08/13/2023, 9:33 AM

## 2023-08-23 ENCOUNTER — Ambulatory Visit (HOSPITAL_COMMUNITY): Payer: Medicare Other | Attending: Podiatry

## 2023-08-23 DIAGNOSIS — M6281 Muscle weakness (generalized): Secondary | ICD-10-CM | POA: Insufficient documentation

## 2023-08-23 DIAGNOSIS — R262 Difficulty in walking, not elsewhere classified: Secondary | ICD-10-CM | POA: Diagnosis not present

## 2023-08-23 DIAGNOSIS — M79671 Pain in right foot: Secondary | ICD-10-CM | POA: Diagnosis not present

## 2023-08-23 NOTE — Therapy (Signed)
OUTPATIENT PHYSICAL THERAPY LOWER EXTREMITY TREATMENT   Patient Name: Troy Jenkins MRN: [id number] DOB:[birth year], 82 y.o., male Today's Date: 08/23/2023  END OF SESSION:  PT End of Session - 08/23/23 0809     Visit Number 5    Number of Visits 6    Date for PT Re-Evaluation 09/04/23    Authorization Type UHC Medicare (approved 6 visits)    Authorization Time Period 07/24/23-09/04/23    Authorization - Visit Number 5    Authorization - Number of Visits 6    Progress Note Due on Visit 10    PT Start Time 0800    PT Stop Time 0840    PT Time Calculation (min) 40 min    Activity Tolerance Patient tolerated treatment well    Behavior During Therapy [facility] for tasks assessed/performed              Past Medical History:  Diagnosis Date   Arthritis    generalized   Cancer (HCC) 2007   prostate   Cataract    not a surgical candidate at this time (03/07/2021)   GOUT 08/13/2007   HYPERLIPIDEMIA 08/13/2007   on meds   HYPERTENSION 08/13/2007   on meds   Ligament tear 06/22/12   left wrist    Skin cancer of arm 10/13   left arm; small skin cancer removed; low grade   VARIX, SCROTAL, LEFT 07/23/2008   Past Surgical History:  Procedure Laterality Date   COLONOSCOPY  07/2010   COLONOSCOPY  2017   JP-MAC-supre(exc)-SSP x3   HERNIA REPAIR  2010   Left    POLYPECTOMY  2017   SSP x3   PROSTATE SURGERY  12/2005   TREATMENT FISTULA ANAL  1980   XI ROBOTIC ASSISTED INGUINAL HERNIA REPAIR WITH MESH Right 11/15/2022   Procedure: XI ROBOTIC ASSISTED INGUINAL HERNIA REPAIR WITH MESH;  Surgeon: Lucretia Roers, MD;  Location: [facility];  Service: General;  Laterality: Right;   Patient Active Problem List   Diagnosis Date Noted   RUQ pain 12/28/2021   Hx of melanoma excision 12/28/2021   H/O prostate cancer 11/27/2013   Routine health maintenance 11/26/2011   Hyperlipidemia 08/13/2007   Gout 08/13/2007   Hypertension 08/13/2007    PCP: Myrlene Broker, MD  REFERRING  PROVIDER: Elinor Parkinson, DPM  REFERRING DIAG: M76.60 (ICD-10-CM) - Achilles tendinitis, unspecified laterality  THERAPY DIAG:  Pain in right foot  Muscle weakness (generalized)  Difficulty in walking, not elsewhere classified  Rationale for Evaluation and Treatment: Rehabilitation  ONSET DATE: ~2 months ago  SUBJECTIVE:   SUBJECTIVE STATEMENT: Doing well today. Patient denies any pain.  EVAL: Pt states he has bursitis on the back of his heel and bone spurs. Pt states it bothers the right foot the most. Thinks it might have been brought on playing golf and walking a lot. Pt states he did get a shot in it which helped. Reports he has been careful with how much walking he does. States he has gotten new shoe inserts which has also helped.   PERTINENT HISTORY: none  PAIN:  Are you having pain? Yes: NPRS scale: 1 currently, at worst >8/10 Pain location: R heel Pain description: aching Aggravating factors: Increased walking >1 mile Relieving factors: Ice pack, injection  PRECAUTIONS: None  RED FLAGS: None   WEIGHT BEARING RESTRICTIONS: No  FALLS:  Has patient fallen in last 6 months? No  LIVING ENVIRONMENT: Lives with: lives with their spouse Lives in: House/apartment Stairs:  Yes: Internal: 10 steps; on right going up Has following equipment at home: None  OCCUPATION: Retired -- gardening, bee keeping, golfing  PLOF: Independent  PATIENT GOALS: Improve pain to baseline levels to return to golfing  NEXT MD VISIT: n/a  OBJECTIVE:   DIAGNOSTIC FINDINGS: x-rays indicating bone spurs  PATIENT SURVEYS:  Lower Extremity Functional Score: 55 / 80 = 68.8 %  COGNITION: Overall cognitive status: Within functional limits for tasks assessed     SENSATION: WFL  EDEMA:  None  MUSCLE LENGTH: Hamstrings: did not assess  POSTURE: No Significant postural limitations  PALPATION: TTP insertion of achilles tendon into calcaneus on R  LOWER EXTREMITY ROM:  Active  ROM Right eval Left eval  Hip flexion    Hip extension    Hip abduction    Hip adduction    Hip internal rotation    Hip external rotation    Knee flexion    Knee extension    Ankle dorsiflexion 10 17  Ankle plantarflexion 45 45  Ankle inversion 30 45  Ankle eversion 30 35   (Blank rows = not tested)  LOWER EXTREMITY MMT:  MMT Right eval Left eval  Hip flexion 5 5  Hip extension    Hip abduction    Hip adduction    Hip internal rotation    Hip external rotation    Knee flexion 5 5  Knee extension 5 5  Ankle dorsiflexion 5 5  Ankle plantarflexion 5 5  Ankle inversion 5 5  Ankle eversion 5 5   (Blank rows = not tested)  LOWER EXTREMITY SPECIAL TESTS:  Not tested  FUNCTIONAL TESTS:  L SLS: >30 sec, R SLS: 18.78 sec  GAIT: Distance walked: in clinic Assistive device utilized: None Level of assistance: Complete Independence Comments: normal pattern   TODAY'S TREATMENT:                                                                                                                              DATE:  08/23/23 NuStep, L5, seat 9, arm 5 x 5' Seated piriformis stretch x 30" x 3 Gastrocnemius slant board stretch x 30" x 3 R soleus slant board stretch x 30" x 3 Standing heel raises with toes out x 10 x 2 x 3 lbs Standing toe yoga x 10 x 2 Seated plantar fascia stretch x 30" x 3 Standing plantar fascia self-mob with a ball x 1' each R lunges on a foam with lateral pull from green TB x 3" x 10 x 2 Mini squats with adductor squeeze on a foam x 3" x 10 x 2  08/13/23 Nustep L4 x 5 min Manual therapy:  IASTM, STM & TPR R gastroc/soleus  Skilled assessment and palpation for TPDN  Trigger Point Dry-Needling  Treatment instructions: Expect mild to moderate muscle soreness. S/S of pneumothorax if dry needled over a lung field, and to seek immediate medical attention should they occur. Patient verbalized understanding  of these instructions and education.  Patient  Consent Given: Yes Education handout provided: Yes Muscles treated: R gastroc Electrical stimulation performed: No Parameters: N/A Treatment response/outcome: Twitch response and decreased muscle tension Prone  Soleus stretch with strap x 30"  Gastroc stretch with strap x 30"  Knee flexion/ext with ankle DF x10 Standing  Gastroc stretch 2x 30"  Soleus stretch 2x30"  Heel raises toes out and then toes in 2x10  Toe raises with toes out and then toes in 2x10    08/07/23 Standing gastrocnemius slant board stretch x 30" x 3 Standing soleus slant board stretch x 30" x 3 Standing heel raises with toes out x 10 x 2 x 2 lbs Mini squats on a counter with adductor squeeze x 3" x 10 Seated plantar fascia stretch x 30" x 3 Seated plantar fascia self-mob with a ball x 1' each Seated ankle DF, eversion,  green TB x 10 x 2 Seated toe yoga x 10 x 2 Seated piriformis stretch x 30" x 2  07/31/23 Standing gastrocnemius slant board stretch x 30" x 3 Standing soleus slant board stretch x 30" x 3 Standing heel raises with toes out x 10 x 2 Mini squats on a counter with adductor squeeze x 3" x 10 Seated plantar fascia stretch x 30" x 2 Seated plantar fascia self-mob with a ball x 1' each Seated ankle DF, eversion, RTB x 10 x 2 Seated piriformis stretch x 30" x 2   07/24/23 See HEP below   PATIENT EDUCATION:  Education details: Written HEP updated, provided and reviewed Person educated: Patient Education method: Explanation, Demonstration, and Handouts Education comprehension: verbalized understanding, returned demonstration, and needs further education  HOME EXERCISE PROGRAM: Access Code: [email or web address] URL: [email or web address] 08/07/23 - Toe Yoga - Alternating Great Toe and Lesser Toe Extension  - 1-2 x daily - 7 x weekly - 2 sets - 10 reps  07/31/23 - Seated Plantar Fascia Stretch  - 1-2 x daily - 7 x weekly - 3 reps - 30 hold - Seated Plantar Fascia Mobilization with Small  Ball  - 1-2 x daily - 7 x weekly - Seated Ankle Dorsiflexion with Resistance  - 1-2 x daily - 7 x weekly - 2 sets - 10 reps - Seated Ankle Eversion with Resistance  - 1-2 x daily - 7 x weekly - 2 sets - 10 reps - Seated Piriformis Stretch  - 1-2 x daily - 7 x weekly - 3 reps - 30 hold - Mini Squat with Counter Support  - 1-2 x daily - 7 x weekly - 2 sets - 10 reps - 3 hold  Date: 07/24/2023 Prepared by: Vernon Prey April Kirstie Peri  Exercises - Standing Gastroc Stretch  - 1 x daily - 7 x weekly - 3 sets - 1 min hold - Standing Soleus Stretch  - 1 x daily - 7 x weekly - 3 sets - 1 min hold - Heel Raise on Step  - 1 x daily - 7 x weekly - 1 sets - 10 reps - Standing Heel Raise with Toes Turned Out  - 1 x daily - 7 x weekly - 1 sets - 10 reps - Standing Heel Raise with Toes Turned In  - 1 x daily - 7 x weekly - 1 sets - 10 reps  ASSESSMENT:  CLINICAL IMPRESSION: Interventions today were geared towards LE flexibility and strength. Tolerated all activities without worsening of symptoms. Demonstrated appropriate levels of fatigue. Provided slight amount of cueing to  ensure correct execution of activity with good carry-over. To date, skilled PT is required to address the impairments and improve function.   EVAL: Patient is a 82 y.o. y.o. male who was seen today for physical therapy evaluation and treatment for left achilles tendonitis. Assessment was significant for decreased R ankle ROM and diminished stability and endurance compared to L affecting activities such as standing and locomotion level resulting in diminished participation with shopping, community activity, yard work, and recreational activities such as golfing. Pt will benefit from PT to address these deficits for return to baseline status.   OBJECTIVE IMPAIRMENTS: decreased activity tolerance, decreased balance, decreased endurance, difficulty walking, decreased ROM, decreased strength, increased fascial restrictions, impaired  flexibility, improper body mechanics, and pain.     GOALS: Goals reviewed with patient? Yes  SHORT TERM GOALS: Target date: 08/14/2023  Pt will be ind with HEP Baseline: Goal status: INITIAL  LONG TERM GOALS: Target date: 09/04/2023   Pt will demo L = R ankle ROM Baseline:  Goal status: INITIAL  2.  Pt will demo L = R SLS for improved balance and stability with recreational tasks Baseline:  Goal status: INITIAL  3.  Pt will have improved LEFS to at least 78.8% to demo MCID Baseline:  Goal status: INITIAL  4.  Pt will be able to return to walking at least 2 miles with <1/10 pain Baseline:  Goal status: INITIAL   PLAN:  PT FREQUENCY: 1x/week  PT DURATION: 6 weeks  PLANNED INTERVENTIONS: Therapeutic exercises, Therapeutic activity, Neuromuscular re-education, Balance training, Gait training, Patient/Family education, Self Care, Joint mobilization, Stair training, Dry Needling, Electrical stimulation, Cryotherapy, Moist heat, Taping, Vasopneumatic device, Ultrasound, Ionotophoresis 4mg /ml Dexamethasone, Manual therapy, and Re-evaluation  PLAN FOR NEXT SESSION: Continue POC and may progress as tolerated with emphasis on gastroc/soleus stretching/strengthening, single leg stability, endurance. Re-assess next visit.   Tish Frederickson. Enoch Moffa, PT, DPT, OCS Board-Certified Clinical Specialist in Orthopedic PT PT Compact Privilege # ([place]): [phone] T  08/23/2023, 8:10 AM

## 2023-08-27 ENCOUNTER — Encounter (HOSPITAL_COMMUNITY): Payer: Medicare Other | Admitting: Physical Therapy

## 2023-08-27 ENCOUNTER — Encounter (HOSPITAL_COMMUNITY): Payer: Medicare Other

## 2023-09-04 ENCOUNTER — Ambulatory Visit (HOSPITAL_COMMUNITY): Payer: Medicare Other

## 2023-09-04 DIAGNOSIS — M6281 Muscle weakness (generalized): Secondary | ICD-10-CM

## 2023-09-04 DIAGNOSIS — R262 Difficulty in walking, not elsewhere classified: Secondary | ICD-10-CM

## 2023-09-04 DIAGNOSIS — M79671 Pain in right foot: Secondary | ICD-10-CM | POA: Diagnosis not present

## 2023-09-04 NOTE — Therapy (Signed)
OUTPATIENT PHYSICAL THERAPY LOWER EXTREMITY PROGRESS/DISCHARGE NOTE   Patient Name: Troy Jenkins MRN: [id number] DOB:08-Oct-1941, 82 y.o., male Today's Date: 09/04/2023  END OF SESSION:  PT End of Session - 09/04/23 0858     Visit Number 6 (P)     Number of Visits 6 (P)     Date for PT Re-Evaluation 09/04/23 (P)     Authorization Type UHC Medicare (approved 6 visits) (P)     Authorization Time Period 07/24/23-09/04/23 (P)     Authorization - Visit Number 6 (P)     Authorization - Number of Visits 6 (P)     PT Start Time 0800 (P)     PT Stop Time 0840 (P)     PT Time Calculation (min) 40 min (P)             Past Medical History:  Diagnosis Date   Arthritis    generalized   Cancer (HCC) 2007   prostate   Cataract    not a surgical candidate at this time (03/07/2021)   GOUT 08/13/2007   HYPERLIPIDEMIA 08/13/2007   on meds   HYPERTENSION 08/13/2007   on meds   Ligament tear 06/22/12   left wrist    Skin cancer of arm 10/13   left arm; small skin cancer removed; low grade   VARIX, SCROTAL, LEFT 07/23/2008   Past Surgical History:  Procedure Laterality Date   COLONOSCOPY  07/2010   COLONOSCOPY  2017   JP-MAC-supre(exc)-SSP x3   HERNIA REPAIR  2010   Left    POLYPECTOMY  2017   SSP x3   PROSTATE SURGERY  12/2005   TREATMENT FISTULA ANAL  1980   XI ROBOTIC ASSISTED INGUINAL HERNIA REPAIR WITH MESH Right 11/15/2022   Procedure: XI ROBOTIC ASSISTED INGUINAL HERNIA REPAIR WITH MESH;  Surgeon: Lucretia Roers, MD;  Location: [facility];  Service: General;  Laterality: Right;   Patient Active Problem List   Diagnosis Date Noted   RUQ pain 12/28/2021   Hx of melanoma excision 12/28/2021   H/O prostate cancer 11/27/2013   Routine health maintenance 11/26/2011   Hyperlipidemia 08/13/2007   Gout 08/13/2007   Hypertension 08/13/2007   Progress Note Reporting Period 07/24/23 to 09/04/23  See note below for Objective Data and Assessment of Progress/Goals.   PCP:  Myrlene Broker, MD  REFERRING PROVIDER: Elinor Parkinson, [place]  REFERRING DIAG: [phone] (ICD-10-CM) - Achilles tendinitis, unspecified laterality  THERAPY DIAG:  Pain in right foot  Muscle weakness (generalized)  Difficulty in walking, not elsewhere classified  Rationale for Evaluation and Treatment: Rehabilitation  ONSET DATE: ~2 months ago  SUBJECTIVE:   SUBJECTIVE STATEMENT: Doing well today. Patient denies any pain especially on the last few days. Denies any pain at the moment and is now able to put enough pressure on the foot. Went to play golf over the weekend and the heels did not bother him. Patient reports that he's around 80% better. Reports that he's back to how he was before the pain started. Patient thinks that he's ready to be d/c from PT.  EVAL: Pt states he has bursitis on the back of his heel and bone spurs. Pt states it bothers the right foot the most. Thinks it might have been brought on playing golf and walking a lot. Pt states he did get a shot in it which helped. Reports he has been careful with how much walking he does. States he has gotten new shoe inserts which has also  helped.   PERTINENT HISTORY: none  PAIN:  Are you having pain? No (09/04/23)  PRECAUTIONS: None  RED FLAGS: None   WEIGHT BEARING RESTRICTIONS: No  FALLS:  Has patient fallen in last 6 months? No  LIVING ENVIRONMENT: Lives with: lives with their spouse Lives in: House/apartment Stairs: Yes: Internal: 10 steps; on right going up Has following equipment at home: None  OCCUPATION: Retired -- gardening, bee keeping, golfing  PLOF: Independent  PATIENT GOALS: Improve pain to baseline levels to return to golfing  NEXT MD VISIT: n/a  OBJECTIVE: All findings are from initial evaluation unless otherwise dated  DIAGNOSTIC FINDINGS: x-rays indicating bone spurs  PATIENT SURVEYS:  Lower Extremity Functional Score: 09/04/23: 59/80 = 73.8% from 55/80 = 68.8  %  COGNITION: Overall cognitive status: Within functional limits for tasks assessed     SENSATION: WFL  EDEMA:  None  MUSCLE LENGTH: Hamstrings: did not assess  POSTURE: No Significant postural limitations  PALPATION: No TTP on insertion of achilles tendon into calcaneus on R (09/04/23)  LOWER EXTREMITY ROM:  Active ROM Right eval Left eval Right 09/04/23 Left 09/04/23  Hip flexion      Hip extension      Hip abduction      Hip adduction      Hip internal rotation      Hip external rotation      Knee flexion      Knee extension      Ankle dorsiflexion 10 17 10 10   Ankle plantarflexion 45 45 60 60  Ankle inversion 30 45 45 45  Ankle eversion 30 35 10 10   (Blank rows = not tested)  LOWER EXTREMITY MMT:  MMT Right Eval & 09/04/23 Left Eval & 09/04/23  Hip flexion 5 5  Hip extension    Hip abduction    Hip adduction    Hip internal rotation    Hip external rotation    Knee flexion 5 5  Knee extension 5 5  Ankle dorsiflexion 5 5  Ankle plantarflexion 5 5  Ankle inversion 5 5  Ankle eversion 5 5   (Blank rows = not tested)  LOWER EXTREMITY SPECIAL TESTS:  Not tested  FUNCTIONAL TESTS:  L SLS: 09/04/23: 47.18 sec >30 sec R SLS: 09/04/23 >30 sec from 18.78 sec  GAIT: Distance walked: in clinic Assistive device utilized: None Level of assistance: Complete Independence Comments: normal pattern   TODAY'S TREATMENT:                                                                                                                              DATE:  09/04/23 Progress note (LEFS, ROM, MMT, SLS) Seated ankle eversion, RTB x 10 x 2 Seated ankle DF, RTB x 10 x 2 Seated piriformis stretch x 30" x 2  08/23/23 NuStep, L5, seat 9, arm 5 x 5' Seated piriformis stretch x 30" x 3 Gastrocnemius slant board stretch x 30" x 3  R soleus slant board stretch x 30" x 3 Standing heel raises with toes out x 10 x 2 x 3 lbs Standing toe yoga x 10 x 2 Seated plantar  fascia stretch x 30" x 3 Standing plantar fascia self-mob with a ball x 1' each R lunges on a foam with lateral pull from green TB x 3" x 10 x 2 Mini squats with adductor squeeze on a foam x 3" x 10 x 2  08/13/23 Nustep L4 x 5 min Manual therapy:  IASTM, STM & TPR R gastroc/soleus  Skilled assessment and palpation for TPDN  Trigger Point Dry-Needling  Treatment instructions: Expect mild to moderate muscle soreness. S/S of pneumothorax if dry needled over a lung field, and to seek immediate medical attention should they occur. Patient verbalized understanding of these instructions and education.  Patient Consent Given: Yes Education handout provided: Yes Muscles treated: R gastroc Electrical stimulation performed: No Parameters: N/A Treatment response/outcome: Twitch response and decreased muscle tension Prone  Soleus stretch with strap x 30"  Gastroc stretch with strap x 30"  Knee flexion/ext with ankle DF x10 Standing  Gastroc stretch 2x 30"  Soleus stretch 2x30"  Heel raises toes out and then toes in 2x10  Toe raises with toes out and then toes in 2x10    08/07/23 Standing gastrocnemius slant board stretch x 30" x 3 Standing soleus slant board stretch x 30" x 3 Standing heel raises with toes out x 10 x 2 x 2 lbs Mini squats on a counter with adductor squeeze x 3" x 10 Seated plantar fascia stretch x 30" x 3 Seated plantar fascia self-mob with a ball x 1' each Seated ankle DF, eversion,  green TB x 10 x 2 Seated toe yoga x 10 x 2 Seated piriformis stretch x 30" x 2  07/31/23 Standing gastrocnemius slant board stretch x 30" x 3 Standing soleus slant board stretch x 30" x 3 Standing heel raises with toes out x 10 x 2 Mini squats on a counter with adductor squeeze x 3" x 10 Seated plantar fascia stretch x 30" x 2 Seated plantar fascia self-mob with a ball x 1' each Seated ankle DF, eversion, RTB x 10 x 2 Seated piriformis stretch x 30" x 2   07/24/23 See HEP  below   PATIENT EDUCATION:  Education details: Written HEP updated, provided and reviewed Person educated: Patient Education method: Explanation, Demonstration, and Handouts Education comprehension: verbalized understanding and returned demonstration  HOME EXERCISE PROGRAM: Access Code: [email or web address] URL: [email or web address] 09/04/23 (Patient gave excellent verbal understanding)  - Verbally instructed patient to not do the heel raises on a step anymore.  - Perform HEP 3x/week for the next month then taper to 2x/week for the following month and to 1x/week on the succeeding month.  08/07/23 - Toe Yoga - Alternating Great Toe and Lesser Toe Extension  - 1-2 x daily - 7 x weekly - 2 sets - 10 reps  07/31/23 - Seated Plantar Fascia Stretch  - 1-2 x daily - 7 x weekly - 3 reps - 30 hold - Seated Plantar Fascia Mobilization with Small Ball  - 1-2 x daily - 7 x weekly - Seated Ankle Dorsiflexion with Resistance  - 1-2 x daily - 7 x weekly - 2 sets - 10 reps - Seated Ankle Eversion with Resistance  - 1-2 x daily - 7 x weekly - 2 sets - 10 reps - Seated Piriformis Stretch  - 1-2 x daily - 7 x weekly -  3 reps - 30 hold - Mini Squat with Counter Support  - 1-2 x daily - 7 x weekly - 2 sets - 10 reps - 3 hold  Date: 07/24/2023 Prepared by: Vernon Prey April Kirstie Peri  Exercises - Standing Gastroc Stretch  - 1 x daily - 7 x weekly - 3 sets - 1 min hold - Standing Soleus Stretch  - 1 x daily - 7 x weekly - 3 sets - 1 min hold - Heel Raise on Step  - 1 x daily - 7 x weekly - 1 sets - 10 reps - Standing Heel Raise with Toes Turned Out  - 1 x daily - 7 x weekly - 1 sets - 10 reps - Standing Heel Raise with Toes Turned In  - 1 x daily - 7 x weekly - 1 sets - 10 reps  ASSESSMENT:  CLINICAL IMPRESSION: Patient demonstrated continued improvements in function as indicated by positive significant changes in pain levels and ROM. Although the LEFS score did not show clinically significant  improvement, patient reports that he's back to his baseline before his heels were aggravated. In addition, patient states that he's been playing golf without any issues from his heels and that he has met his stated rehab goals. With this, skilled PT is not required anymore at this time and patient is d/c from skilled PT services. Patient can be d/c to HEP to facilitate carry-over of the gains from rehab. Interventions today were geared towards review of some of the exercises on his HEP. Tolerated all activities without worsening of symptoms. Demonstrated appropriate levels of fatigue. Provided no cueing to ensure correct execution of activity with good carry-over.   EVAL: Patient is a 82 y.o. y.o. male who was seen today for physical therapy evaluation and treatment for left achilles tendonitis. Assessment was significant for decreased R ankle ROM and diminished stability and endurance compared to L affecting activities such as standing and locomotion level resulting in diminished participation with shopping, community activity, yard work, and recreational activities such as golfing. Pt will benefit from PT to address these deficits for return to baseline status.   OBJECTIVE IMPAIRMENTS: decreased activity tolerance, decreased balance, decreased endurance, difficulty walking, decreased ROM, decreased strength, increased fascial restrictions, impaired flexibility, improper body mechanics, and pain.     GOALS: Goals reviewed with patient? Yes  SHORT TERM GOALS: Target date: 08/14/2023  Pt will be ind with HEP Baseline: Goal status: MET  LONG TERM GOALS: Target date: 09/04/2023   Pt will demo L = R ankle ROM Baseline:  Goal status: MET  2.  Pt will demo L = R SLS for improved balance and stability with recreational tasks Baseline:  Goal status: MET  3.  Pt will have improved LEFS to at least 78.8% to demo MCID Baseline:  Goal status: NOT MET  4.  Pt will be able to return to walking at  least 2 miles with <1/10 pain Baseline:  Goal status: MET   PLAN:  PT FREQUENCY:  0  PT DURATION: other: 0  PLANNED INTERVENTIONS:  D/C from skilled PT. D/C to HEP  PHYSICAL THERAPY DISCHARGE SUMMARY  Visits from Start of Care: 6  Current functional level related to goals / functional outcomes: See above   Remaining deficits: See above   Education / Equipment: See above   Patient agrees to discharge. Patient goals were partially met. Patient is being discharged due to meeting the stated rehab goals.    Tish Frederickson. Lavonne Kinderman, PT,  DPT, OCS Board-Certified Clinical Specialist in Orthopedic PT PT Compact Privilege # ([place]): [phone] T  09/04/2023, 8:59 AM

## 2023-09-06 DIAGNOSIS — Z1283 Encounter for screening for malignant neoplasm of skin: Secondary | ICD-10-CM | POA: Diagnosis not present

## 2023-09-06 DIAGNOSIS — X32XXXD Exposure to sunlight, subsequent encounter: Secondary | ICD-10-CM | POA: Diagnosis not present

## 2023-09-06 DIAGNOSIS — L570 Actinic keratosis: Secondary | ICD-10-CM | POA: Diagnosis not present

## 2023-09-06 DIAGNOSIS — D225 Melanocytic nevi of trunk: Secondary | ICD-10-CM | POA: Diagnosis not present

## 2023-10-07 ENCOUNTER — Ambulatory Visit
Admission: EM | Admit: 2023-10-07 | Discharge: 2023-10-07 | Disposition: A | Discharge status: Home / Self Care | Payer: Medicare Other | Attending: Nurse Practitioner | Admitting: Nurse Practitioner

## 2023-10-07 ENCOUNTER — Ambulatory Visit: Payer: Medicare Other

## 2023-10-07 DIAGNOSIS — M79671 Pain in right foot: Secondary | ICD-10-CM | POA: Diagnosis not present

## 2023-10-07 DIAGNOSIS — S60811A Abrasion of right wrist, initial encounter: Secondary | ICD-10-CM

## 2023-10-07 DIAGNOSIS — S92901A Unspecified fracture of right foot, initial encounter for closed fracture: Secondary | ICD-10-CM | POA: Diagnosis not present

## 2023-10-07 DIAGNOSIS — S00411A Abrasion of right ear, initial encounter: Secondary | ICD-10-CM

## 2023-10-07 DIAGNOSIS — S9031XA Contusion of right foot, initial encounter: Secondary | ICD-10-CM | POA: Diagnosis not present

## 2023-10-07 DIAGNOSIS — M19071 Primary osteoarthritis, right ankle and foot: Secondary | ICD-10-CM | POA: Diagnosis not present

## 2023-10-07 NOTE — ED Triage Notes (Addendum)
Pt reports he fell about 2 hours ago hitting his right ear on some brick blocks, right foot swollen, and left knee pain.  Pts 2nd fall in 3 weeks where he hit his head. Pt has a wound/lac to his

## 2023-10-07 NOTE — Discharge Instructions (Signed)
X-ray of the right foot does show 2 possible fractures.  I would like for you to follow-up with orthopedics within the next 24 hours for reevaluation. Wear the cam boot provided today with any weightbearing.  Do not ambulate on the foot without use of the boot. May take over-the-counter Tylenol as needed for pain or discomfort. RICE therapy, rest, ice, compression, and elevation.  Apply ice for 20 minutes, remove for 1 hour, repeat as much as possible. I provided you information for [facility] [place], you will need to call and make an appointment, I have also provided information for [facility] which you are able to walk in without an appointment. Follow-up as needed.

## 2023-10-07 NOTE — ED Provider Notes (Signed)
[facility]    CSN: [id number] Arrival date & time: 10/07/23  1333      History   Chief Complaint No chief complaint on file.   HPI Troy Jenkins is a 82 y.o. male.   The history is provided by the patient.   Patient presents for complaints of right foot pain, and left knee pain after fall earlier this afternoon.  Patient also states that he hit the right ear when he fell.  He has bleeding to the right ear at present.  Patient states that he was going into his basement, and missed a step.  States he did hit his head, denies loss of consciousness or being on blood thinners.  Also states that he does remember the fall.  Patient has pain and swelling to the right foot, and difficulty bearing weight.  He states that his knee feels okay, denies inability to ambulate or swelling of the right knee.  Patient reports he has fallen twice within the past 3 weeks.  Past Medical History:  Diagnosis Date   Arthritis    generalized   Cancer (HCC) 2007   prostate   Cataract    not a surgical candidate at this time (03/07/2021)   GOUT 08/13/2007   HYPERLIPIDEMIA 08/13/2007   on meds   HYPERTENSION 08/13/2007   on meds   Ligament tear 06/22/12   left wrist    Skin cancer of arm 10/13   left arm; small skin cancer removed; low grade   VARIX, SCROTAL, LEFT 07/23/2008    Patient Active Problem List   Diagnosis Date Noted   RUQ pain 12/28/2021   Hx of melanoma excision 12/28/2021   H/O prostate cancer 11/27/2013   Routine health maintenance 11/26/2011   Hyperlipidemia 08/13/2007   Gout 08/13/2007   Hypertension 08/13/2007    Past Surgical History:  Procedure Laterality Date   COLONOSCOPY  07/2010   COLONOSCOPY  2017   JP-MAC-supre(exc)-SSP x3   HERNIA REPAIR  2010   Left    POLYPECTOMY  2017   SSP x3   PROSTATE SURGERY  12/2005   TREATMENT FISTULA ANAL  1980   XI ROBOTIC ASSISTED INGUINAL HERNIA REPAIR WITH MESH Right 11/15/2022   Procedure: XI ROBOTIC ASSISTED  INGUINAL HERNIA REPAIR WITH MESH;  Surgeon: Lucretia Roers, MD;  Location: [facility];  Service: General;  Laterality: Right;       Home Medications    Prior to Admission medications   Medication Sig Start Date End Date Taking? Authorizing Provider  aspirin 81 MG tablet Take 81 mg by mouth daily.    [provider]  diclofenac Sodium (VOLTAREN) 1 % GEL Apply 4 g topically 4 (four) times daily as needed (bone spur). Cream as needed    [provider]  Iron, Ferrous Sulfate, 325 (65 Fe) MG TABS Take 325 mg by mouth daily.    [provider]  lisinopril (ZESTRIL) 10 MG tablet TAKE 1 TABLET BY MOUTH DAILY  WITH 20MG  TABLET FOR A TOTAL  DAILY DOSE OF 30MG  03/20/23   Myrlene Broker, MD  lisinopril (ZESTRIL) 20 MG tablet TAKE 1 TABLET BY MOUTH DAILY 03/20/23   Myrlene Broker, MD  lovastatin (MEVACOR) 40 MG tablet TAKE 1 TABLET BY MOUTH AT  BEDTIME 07/17/23   Myrlene Broker, MD  Multiple Vitamin (MULTIVITAMIN) tablet Take 1 tablet by mouth daily.    [provider]  ondansetron (ZOFRAN) 4 MG tablet Take 1 tablet (4 mg total)  by mouth every 8 (eight) hours as needed. 11/15/22 11/15/23  Lucretia Roers, MD  TART CHERRY PO Take 1,000 mg by mouth daily.    [provider]  tetrahydrozoline-zinc (VISINE-AC) 0.05-0.25 % ophthalmic solution Place 2 drops into both eyes 3 (three) times daily as needed (dry eyes).    [provider]  triamcinolone (KENALOG) 0.1 % Apply 1 application topically 2 (two) times daily as needed. 12/06/20   Hilts, Casimiro Needle, MD  Turmeric 500 MG CAPS Take 500 mg by mouth daily.    [provider]    Family History Family History  Problem Relation Age of Onset   Leukemia Mother    Diabetes Father    Stroke Father    Cancer Maternal Grandmother    Colon cancer Maternal Grandmother 70   Colon polyps Maternal Grandmother 10   Stroke Paternal Grandfather    Breast cancer Sister 59   Esophageal cancer Neg  Hx    Rectal cancer Neg Hx    Stomach cancer Neg Hx     Social History Social History   Tobacco Use   Smoking status: Former    Current packs/day: 0.00    Types: Cigarettes    Quit date: 05/31/1968    Years since quitting: 55.3   Smokeless tobacco: Never  Vaping Use   Vaping status: Never Used  Substance Use Topics   Alcohol use: Yes    Alcohol/week: 0.0 standard drinks of alcohol    Comment: 1 per month   Drug use: No     Allergies   Amoxicillin, Doxycycline, and Iodine   Review of Systems Review of Systems Per HPI  Physical Exam Triage Vital Signs ED Triage Vitals  Encounter Vitals Group     BP 10/07/23 1424 (!) 153/91     Systolic BP Percentile --      Diastolic BP Percentile --      Pulse Rate 10/07/23 1424 88     Resp 10/07/23 1424 18     Temp 10/07/23 1424 98.4 F (36.9 C)     Temp Source 10/07/23 1424 Oral     SpO2 10/07/23 1424 99 %     Weight --      Height --      Head Circumference --      Peak Flow --      Pain Score 10/07/23 1427 5     Pain Loc --      Pain Education --      Exclude from Growth Chart --    No data found.  Updated Vital Signs BP (!) 153/91 (BP Location: Right Arm)   Pulse 88   Temp 98.4 F (36.9 C) (Oral)   Resp 18   SpO2 99%   Visual Acuity Right Eye Distance:   Left Eye Distance:   Bilateral Distance:    Right Eye Near:   Left Eye Near:    Bilateral Near:     Physical Exam Vitals and nursing note reviewed.  Constitutional:      General: He is not in acute distress.    Appearance: Normal appearance.  HENT:     Head: Normocephalic.     Left Ear: Tympanic membrane, ear canal and external ear normal.  Eyes:     Extraocular Movements: Extraocular movements intact.     Conjunctiva/sclera: Conjunctivae normal.     Pupils: Pupils are equal, round, and reactive to light.  Cardiovascular:     Pulses: Normal pulses.     Heart  sounds: Normal heart sounds.  Pulmonary:     Effort: Pulmonary effort is normal. No  respiratory distress.     Breath sounds: Normal breath sounds. No stridor. No wheezing, rhonchi or rales.  Musculoskeletal:     Cervical back: Normal range of motion.     Right ankle: Normal.     Right foot: Decreased range of motion. Normal capillary refill. Swelling and tenderness present. Normal pulse.     Comments: Moderate swelling noted to the lateral aspect of the right foot.  There is no bruising, redness, or obvious deformity present.  Skin:    General: Skin is warm and dry.     Comments: Abrasion noted to the right ear.  Mild bleeding present.  Abrasion also noted to the right forearm, no bleeding present.  Neurological:     General: No focal deficit present.     Mental Status: He is alert and oriented to person, place, and time.  Psychiatric:        Mood and Affect: Mood normal.        Behavior: Behavior normal.      UC Treatments / Results  Labs (all labs ordered are listed, but only abnormal results are displayed) Labs Reviewed - No data to display  EKG   Radiology DG Foot Complete Right  Result Date: 10/07/2023 CLINICAL DATA:  Fall with lateral foot pain and bruising EXAM: RIGHT FOOT COMPLETE - 3+ VIEW COMPARISON:  05/31/2023 FINDINGS: Suspicion of acute dorsal cortical avulsion at the navicular with overlying soft tissue swelling. Suspected fracture fragments at the level of the calcaneal cuboidal articulation. Degenerative changes at the first MTP joint. Vascular calcifications. IMPRESSION: 1. Suspicion of acute dorsal cortical avulsion at the navicular with overlying soft tissue swelling. 2. Suspected fracture fragments at the level of the calcaneal cuboidal articulation. Suggest CT for further assessment. Electronically Signed   By: Jasmine Pang M.D.   On: 10/07/2023 15:54    Procedures Procedures (including critical care time)  Medications Ordered in UC Medications - No data to display  Initial Impression / Assessment and Plan / UC Course  I have reviewed  the triage vital signs and the nursing notes.  Pertinent labs & imaging results that were available during my care of the patient were reviewed by me and considered in my medical decision making (see chart for details).  X-ray of the right foot with suspicion of acute dorsal cortical avulsion fracture at the navicular and suspected fracture fragments at the level of the calcaneal cuboidal articulation.  CT was recommended for further assessment.  Will have patient follow-up with orthopedics for further evaluation.  Cam boot was placed to provide compression and support until patient is able to follow-up with orthopedics.  Patient advised to wear the cam boot with any ambulation or weightbearing.  Supportive care recommendations were provided and discussed with the patient to include over-the-counter analgesics, and RICE therapy.  Discussed wound care with the patient regarding the abrasions.  Patient was given information for orthopedics to follow-up within the next 24 hours.  Patient was in agreement with this plan of care and verbalized understanding.  All questions were answered.  Patient stable for discharge.  Final Clinical Impressions(s) / UC Diagnoses   Final diagnoses:  Closed fracture of right foot, initial encounter  Abrasion of right ear, initial encounter  Abrasion of right wrist, initial encounter     Discharge Instructions      X-ray of the right foot does show 2 possible fractures.  I would like for you to follow-up with orthopedics within the next 24 hours for reevaluation. Wear the cam boot provided today with any weightbearing.  Do not ambulate on the foot without use of the boot. May take over-the-counter Tylenol as needed for pain or discomfort. RICE therapy, rest, ice, compression, and elevation.  Apply ice for 20 minutes, remove for 1 hour, repeat as much as possible. I provided you information for [facility] [place], you will need to call and make an appointment, I  have also provided information for [facility] which you are able to walk in without an appointment. Follow-up as needed.     ED Prescriptions   None    PDMP not reviewed this encounter.   Abran Cantor, NP 10/07/23 [phone]

## 2023-10-08 ENCOUNTER — Other Ambulatory Visit (HOSPITAL_COMMUNITY): Payer: Self-pay | Admitting: Otolaryngology

## 2023-10-08 ENCOUNTER — Telehealth: Payer: Self-pay

## 2023-10-08 DIAGNOSIS — M79671 Pain in right foot: Secondary | ICD-10-CM | POA: Diagnosis not present

## 2023-10-08 DIAGNOSIS — M25562 Pain in left knee: Secondary | ICD-10-CM | POA: Diagnosis not present

## 2023-10-08 NOTE — Telephone Encounter (Signed)
Patient left message about needing to schedule an appointment. I returned his call and got the message that the call couldn't be completed at this time.

## 2023-10-10 ENCOUNTER — Ambulatory Visit (HOSPITAL_COMMUNITY)
Admission: RE | Admit: 2023-10-10 | Discharge: 2023-10-10 | Disposition: A | Discharge status: Home / Self Care | Payer: Medicare Other | Source: Ambulatory Visit | Attending: Otolaryngology | Admitting: Otolaryngology

## 2023-10-10 DIAGNOSIS — S92251A Displaced fracture of navicular [scaphoid] of right foot, initial encounter for closed fracture: Secondary | ICD-10-CM | POA: Diagnosis not present

## 2023-10-10 DIAGNOSIS — S92001A Unspecified fracture of right calcaneus, initial encounter for closed fracture: Secondary | ICD-10-CM | POA: Diagnosis not present

## 2023-10-10 DIAGNOSIS — M79671 Pain in right foot: Secondary | ICD-10-CM | POA: Insufficient documentation

## 2023-10-10 DIAGNOSIS — M19071 Primary osteoarthritis, right ankle and foot: Secondary | ICD-10-CM | POA: Diagnosis not present

## 2023-10-15 DIAGNOSIS — S92001A Unspecified fracture of right calcaneus, initial encounter for closed fracture: Secondary | ICD-10-CM | POA: Diagnosis not present

## 2023-10-15 DIAGNOSIS — S92254A Nondisplaced fracture of navicular [scaphoid] of right foot, initial encounter for closed fracture: Secondary | ICD-10-CM | POA: Diagnosis not present

## 2023-11-26 DIAGNOSIS — S92254D Nondisplaced fracture of navicular [scaphoid] of right foot, subsequent encounter for fracture with routine healing: Secondary | ICD-10-CM | POA: Diagnosis not present

## 2023-11-26 DIAGNOSIS — S92001D Unspecified fracture of right calcaneus, subsequent encounter for fracture with routine healing: Secondary | ICD-10-CM | POA: Diagnosis not present

## 2023-11-29 ENCOUNTER — Other Ambulatory Visit: Payer: Self-pay | Admitting: Internal Medicine

## 2023-11-30 ENCOUNTER — Other Ambulatory Visit: Payer: Self-pay | Admitting: Internal Medicine

## 2024-01-02 ENCOUNTER — Ambulatory Visit: Payer: Medicare Other

## 2024-01-02 VITALS — Ht 70.0 in | Wt 175.0 lb

## 2024-01-02 DIAGNOSIS — Z0000 Encounter for general adult medical examination without abnormal findings: Secondary | ICD-10-CM

## 2024-01-02 NOTE — Patient Instructions (Signed)
[name] , Thank you for taking time to come for your Medicare Wellness Visit. I appreciate your ongoing commitment to your health goals. Please review the following plan we discussed and let me know if I can assist you in the future.   Referrals/Orders/Follow-Ups/Clinician Recommendations: It was very nice to talk with you today.  Keep up the good work.  This is a list of the screening recommended for you and due dates:  Health Maintenance  Topic Date Due   COVID-19 Vaccine (5 - 2024-25 season) 07/15/2023   Medicare Annual Wellness Visit  01/01/2025   DTaP/Tdap/Td vaccine (5 - Td or Tdap) 12/18/2029   Pneumonia Vaccine  Completed   Flu Shot  Completed   Zoster (Shingles) Vaccine  Completed   HPV Vaccine  Aged Out   Colon Cancer Screening  Discontinued    Advanced directives: (Copy Requested) Please bring a copy of your health care power of attorney and living will to the office to be added to your chart at your convenience.  Next Medicare Annual Wellness Visit scheduled for next year: Yes

## 2024-01-02 NOTE — Progress Notes (Cosign Needed Addendum)
 Subjective:   Troy Jenkins is a 83 y.o. male who presents for Medicare Annual/Subsequent preventive examination.  Visit Complete: Virtual I connected with  Troy Jenkins on 01/02/24 by a audio enabled telemedicine application and verified that I am speaking with the correct person using two identifiers.  Patient Location: Home  Provider Location: Home Office  I discussed the limitations of evaluation and management by telemedicine. The patient expressed understanding and agreed to proceed.  Vital Signs: Because this visit was a virtual/telehealth visit, some criteria may be missing or patient reported. Any vitals not documented were not able to be obtained and vitals that have been documented are patient reported.   Cardiac Risk Factors include: advanced age (>82men, >59 women);hypertension;male gender;dyslipidemia     Objective:    Today's Vitals   01/02/24 0806  Weight: 175 lb (79.4 kg)  Height: 5\' 10"  (1.778 m)   Body mass index is 25.11 kg/m.     01/02/2024    8:22 AM 07/24/2023    9:48 AM 01/01/2023    9:09 AM 11/15/2022    6:26 AM 11/10/2022   10:05 AM 12/17/2018   10:37 AM 12/14/2017   10:04 AM  Advanced Directives  Does Patient Have a Medical Advance Directive? Yes Yes Yes No No Yes Yes  Type of Estate agent of [place];Living will Healthcare Power of [place];Living will Healthcare Power of [place];Living will   Healthcare Power of [place];Living will Healthcare Power of [place];Living will  Does patient want to make changes to medical advance directive?  No - Patient declined No - Patient declined      Copy of Healthcare Power of Attorney in Chart? No - copy requested  No - copy requested   No - copy requested No - copy requested  Would patient like information on creating a medical advance directive?    No - Patient declined No - Patient declined      Current Medications (verified) Outpatient Encounter Medications as of 01/02/2024   Medication Sig   aspirin 81 MG tablet Take 81 mg by mouth daily.   diclofenac Sodium (VOLTAREN) 1 % GEL Apply 4 g topically 4 (four) times daily as needed (bone spur). Cream as needed   Iron, Ferrous Sulfate, 325 (65 Fe) MG TABS Take 325 mg by mouth daily.   lisinopril (ZESTRIL) 10 MG tablet TAKE 1 TABLET BY MOUTH DAILY  WITH 20 MG TABLET FOR A TOTAL  DAILY DOSE OF 30 MG   lisinopril (ZESTRIL) 20 MG tablet TAKE 1 TABLET BY MOUTH DAILY   lovastatin (MEVACOR) 40 MG tablet TAKE 1 TABLET BY MOUTH AT  BEDTIME   Multiple Vitamin (MULTIVITAMIN) tablet Take 1 tablet by mouth daily.   TART CHERRY PO Take 1,000 mg by mouth daily.   tetrahydrozoline-zinc (VISINE-AC) 0.05-0.25 % ophthalmic solution Place 2 drops into both eyes 3 (three) times daily as needed (dry eyes).   triamcinolone (KENALOG) 0.1 % Apply 1 application topically 2 (two) times daily as needed.   Turmeric 500 MG CAPS Take 500 mg by mouth daily.   No facility-administered encounter medications on file as of 01/02/2024.    Allergies (verified) Amoxicillin, Doxycycline, and Iodine   History: Past Medical History:  Diagnosis Date   Arthritis    generalized   Cancer (HCC) 2007   prostate   Cataract    not a surgical candidate at this time (03/07/2021)   GOUT 08/13/2007   HYPERLIPIDEMIA 08/13/2007   on meds   HYPERTENSION 08/13/2007  on meds   Ligament tear 06/22/12   left wrist    Skin cancer of arm 10/13   left arm; small skin cancer removed; low grade   VARIX, SCROTAL, LEFT 07/23/2008   Past Surgical History:  Procedure Laterality Date   COLONOSCOPY  07/2010   COLONOSCOPY  2017   JP-MAC-supre(exc)-SSP x3   HERNIA REPAIR  2010   Left    POLYPECTOMY  2017   SSP x3   PROSTATE SURGERY  12/2005   TREATMENT FISTULA ANAL  1980   XI ROBOTIC ASSISTED INGUINAL HERNIA REPAIR WITH MESH Right 11/15/2022   Procedure: XI ROBOTIC ASSISTED INGUINAL HERNIA REPAIR WITH MESH;  Surgeon: Lucretia Roers, MD;  Location: [facility];  Service:  General;  Laterality: Right;   Family History  Problem Relation Age of Onset   Leukemia Mother    Diabetes Father    Stroke Father    Cancer Maternal Grandmother    Colon cancer Maternal Grandmother 34   Colon polyps Maternal Grandmother 12   Stroke Paternal Grandfather    Breast cancer Sister 25   Esophageal cancer Neg Hx    Rectal cancer Neg Hx    Stomach cancer Neg Hx    Social History   Socioeconomic History   Marital status: Married    Spouse name: Troy Jenkins   Number of children: 0   Years of education: Not on file   Highest education level: Not on file  Occupational History   Occupation: retired  Tobacco Use   Smoking status: Former    Current packs/day: 0.00    Types: Cigarettes    Quit date: 05/31/1968    Years since quitting: 55.6   Smokeless tobacco: Never  Vaping Use   Vaping status: Never Used  Substance and Sexual Activity   Alcohol use: Yes    Comment: rarly   Drug use: No   Sexual activity: Not Currently  Other Topics Concern   Not on file  Social History Narrative   HS[employer], [employer]- textile degree. Married 1965. No children. 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      Lives with wife-2025   Social Drivers of Health   Financial Resource Strain: Low Risk  (01/02/2024)   Overall Financial Resource Strain (CARDIA)    Difficulty of Paying Living Expenses: Not very hard  Food Insecurity: No Food Insecurity (01/02/2024)   Hunger Vital Sign    Worried About Running Out of Food in the Last Year: Never true    Ran Out of Food in the Last Year: Never true  Transportation Needs: No Transportation Needs (01/02/2024)   PRAPARE - Administrator, Civil Service (Medical): No    Lack of Transportation (Non-Medical): No  Physical Activity: Inactive (01/02/2024)   Exercise Vital Sign    Days of Exercise per Week: 0 days    Minutes of Exercise per Session: 0 min   Stress: No Stress Concern Present (01/02/2024)   [employer] of Occupational Health - Occupational Stress Questionnaire    Feeling of Stress : Not at all  Social Connections: Moderately Integrated (01/02/2024)   Social Connection and Isolation Panel [NHANES]    Frequency of Communication with Friends and Family: More than three times a week    Frequency of Social Gatherings with Friends and Family: Once a week    Attends Religious Services: Never    Database administrator or Organizations:  Yes    Attends Banker Meetings: Never    Marital Status: Married    Tobacco Counseling Counseling given: Not Answered   Clinical Intake:  Pre-visit preparation completed: Yes  Pain : No/denies pain     BMI - recorded: 25.11 Nutritional Status: BMI 25 -29 Overweight Nutritional Risks: None Diabetes: No  How often do you need to have someone help you when you read instructions, pamphlets, or other written materials from your doctor or pharmacy?: 1 - Never  Interpreter Needed?: No  Information entered by :: Troy Jenkins, RMA   Activities of Daily Living    01/02/2024    8:17 AM  In your present state of health, do you have any difficulty performing the following activities:  Hearing? 0  Vision? 0  Difficulty concentrating or making decisions? 0  Walking or climbing stairs? 0  Dressing or bathing? 0  Doing errands, shopping? 0  Preparing Food and eating ? N  Using the Toilet? N  In the past six months, have you accidently leaked urine? N  Do you have problems with loss of bowel control? N  Managing your Medications? N  Managing your Finances? N  Housekeeping or managing your Housekeeping? N    Patient Care Team: Myrlene Broker, MD as PCP - General (Internal Medicine) Rennis Golden Lisette Abu, MD as PCP - Cardiology (Cardiology) Nita Sells, MD (Dermatology) Heloise Purpura, MD (Urology)  Indicate any recent Medical Services you may have received from other  than [facility] providers in the past year (date may be approximate).     Assessment:   This is a routine wellness examination for Troy Jenkins.  Hearing/Vision screen Hearing Screening - Comments:: Denies hearing difficulties   Vision Screening - Comments:: Wears eyeglasses   Goals Addressed             This Visit's Progress    Patient Stated   On track    Continue be as healthy and as independent as possible.       Depression Screen    01/02/2024    8:28 AM 01/01/2023    9:59 AM 01/01/2023    9:08 AM 12/28/2021    8:00 AM 12/27/2020   10:23 AM 12/19/2019    8:45 AM 12/17/2018    9:30 AM  PHQ 2/9 Scores  PHQ - 2 Score 0 0 0 0 0 0 0  PHQ- 9 Score 0 0         Fall Risk    01/02/2024    8:20 AM 01/01/2023    9:59 AM 01/01/2023    9:09 AM 10/17/2022   10:08 AM 12/28/2021    8:00 AM  Fall Risk   Falls in the past year? 1 0 0 0 0  Comment 2 falls      Number falls in past yr: 1 0 0  0  Injury with Fall? 1 0 0  0  Risk for fall due to :   No Fall Risks    Follow up Falls evaluation completed;Falls prevention discussed Falls evaluation completed Falls evaluation completed Falls evaluation completed     MEDICARE RISK AT HOME: Medicare Risk at Home Any stairs in or around the home?: Yes If so, are there any without handrails?: Yes Home free of loose throw rugs in walkways, pet beds, electrical cords, etc?: Yes Adequate lighting in your home to reduce risk of falls?: Yes Life alert?: No Use of a cane, walker or w/c?: No Grab bars in the bathroom?: No  Shower chair or bench in shower?: No Elevated toilet seat or a handicapped toilet?: No  TIMED UP AND GO:  Was the test performed?  No    Cognitive Function:    12/14/2017   10:09 AM  MMSE - Mini Mental State Exam  Orientation to time 5  Orientation to Place 5  Registration 3  Attention/ Calculation 5  Recall 3  Language- name 2 objects 2  Language- repeat 1  Language- follow 3 step command 3  Language- read & follow direction  1  Write a sentence 1  Copy design 1  Total score 30        01/02/2024    8:23 AM 01/01/2023    9:11 AM  6CIT Screen  What Year? 0 points 0 points  What month? 0 points 0 points  What time? 0 points 0 points  Count back from 20 0 points 0 points  Months in reverse 0 points 0 points  Repeat phrase 0 points 0 points  Total Score 0 points 0 points    Immunizations Immunization History  Administered Date(s) Administered   Fluad Quad(high Dose 65+) 07/09/2019, 09/11/2020, 07/29/2021   Influenza Split 08/13/2013, 09/14/2015, 08/13/2016, 08/13/2017   Influenza Whole 09/01/2012   Influenza, High Dose Seasonal PF 08/28/2016, 08/13/2017, 09/04/2018   Influenza-Unspecified 08/13/2009, 08/13/2010, 08/14/2011, 07/27/2013, 08/18/2014, 09/28/2015, 09/11/2018, 09/11/2020, 08/09/2022   PFIZER(Purple Top)SARS-COV-2 Vaccination 01/08/2020, 01/28/2020, 09/11/2020   Pneumococcal Conjugate-13 11/26/2013, 08/17/2014, 11/14/2014   Pneumococcal Polysaccharide-23 11/29/2015   Pneumococcal-Unspecified 08/27/2009   Td 09/30/2009   Td (Adult),5 Lf Tetanus Toxid, Preservative Free 05/06/2003   Tdap 05/06/2003, 11/13/2012, 12/19/2019   Zoster Recombinant(Shingrix) 07/10/2018, 09/17/2018    TDAP status: Up to date  Flu Vaccine status: Up to date  Pneumococcal vaccine status: Up to date  Covid-19 vaccine status: Completed vaccines  Qualifies for Shingles Vaccine? Yes   Zostavax completed Yes   Shingrix Completed?: Yes  Screening Tests Health Maintenance  Topic Date Due   COVID-19 Vaccine (5 - 2024-25 season) 07/15/2023   Medicare Annual Wellness (AWV)  01/01/2025   DTaP/Tdap/Td (5 - Td or Tdap) 12/18/2029   Pneumonia Vaccine 41+ Years old  Completed   INFLUENZA VACCINE  Completed   Zoster Vaccines- Shingrix  Completed   HPV VACCINES  Aged Out   Colonoscopy  Discontinued    Health Maintenance  Health Maintenance Due  Topic Date Due   COVID-19 Vaccine (5 - 2024-25 season) 07/15/2023     Colorectal cancer screening: No longer required.   Lung Cancer Screening: (Low Dose CT Chest recommended if Age 28-80 years, 20 pack-year currently smoking OR have quit w/in 15years.) does not qualify.   Lung Cancer Screening Referral: N/A  Additional Screening:  Hepatitis C Screening: does not qualify;   Vision Screening: Recommended annual ophthalmology exams for early detection of glaucoma and other disorders of the eye. Is the patient up to date with their annual eye exam?  Yes  Who is the provider or what is the name of the office in which the patient attends annual eye exams? [facility] [place] [facility], Troy Jenkins) If pt is not established with a provider, would they like to be referred to a provider to establish care? No .   Dental Screening: Recommended annual dental exams for proper oral hygiene   Community Resource Referral / Chronic Care Management: CRR required this visit?  No   CCM required this visit?  No     Plan:     I have personally reviewed and  noted the following in the patient's chart:   Medical and social history Use of alcohol, tobacco or illicit drugs  Current medications and supplements including opioid prescriptions. Patient is not currently taking opioid prescriptions. Functional ability and status Nutritional status Physical activity Advanced directives List of other physicians Hospitalizations, surgeries, and ER visits in previous 12 months Vitals Screenings to include cognitive, depression, and falls Referrals and appointments  In addition, I have reviewed and discussed with patient certain preventive protocols, quality metrics, and best practice recommendations. A written personalized care plan for preventive services as well as general preventive health recommendations were provided to patient.     Troy Jenkins, CMA   01/02/2024   After Visit Summary: (MyChart) Due to this being a telephonic visit, the after visit summary with patients  personalized plan was offered to patient via MyChart   Nurse Notes: Patient is up to date with all health maintenance.  He is concerned about traveling to office tomorrow for his appointment.  I informed patient that we should know by the end of the day if office will be closed tomorrow due to weather.  The office will let all patient's know to reschedule or maybe have a choice for virtual.  He had no other concerns to address today.  Medical screening examination/treatment/procedure(s) were performed by non-physician practitioner and as supervising physician I was immediately available for consultation/collaboration.  I agree with above. Jacinta Shoe, MD

## 2024-01-03 ENCOUNTER — Encounter: Payer: Medicare Other | Admitting: Internal Medicine

## 2024-01-18 ENCOUNTER — Ambulatory Visit: Payer: Medicare Other | Admitting: Internal Medicine

## 2024-01-18 ENCOUNTER — Encounter: Payer: Self-pay | Admitting: Internal Medicine

## 2024-01-18 ENCOUNTER — Telehealth (HOSPITAL_COMMUNITY): Payer: Self-pay | Admitting: *Deleted

## 2024-01-18 VITALS — BP 160/80 | HR 72 | Temp 97.9°F | Ht 70.0 in | Wt 175.0 lb

## 2024-01-18 DIAGNOSIS — Z8546 Personal history of malignant neoplasm of prostate: Secondary | ICD-10-CM

## 2024-01-18 DIAGNOSIS — R5383 Other fatigue: Secondary | ICD-10-CM

## 2024-01-18 DIAGNOSIS — E782 Mixed hyperlipidemia: Secondary | ICD-10-CM | POA: Diagnosis not present

## 2024-01-18 DIAGNOSIS — M1A9XX0 Chronic gout, unspecified, without tophus (tophi): Secondary | ICD-10-CM

## 2024-01-18 DIAGNOSIS — Z0000 Encounter for general adult medical examination without abnormal findings: Secondary | ICD-10-CM | POA: Diagnosis not present

## 2024-01-18 DIAGNOSIS — I10 Essential (primary) hypertension: Secondary | ICD-10-CM | POA: Diagnosis not present

## 2024-01-18 LAB — CBC
HCT: 48.1 % (ref 39.0–52.0)
Hemoglobin: 16.4 g/dL (ref 13.0–17.0)
MCHC: 34.1 g/dL (ref 30.0–36.0)
MCV: 92.5 fl (ref 78.0–100.0)
Platelets: 234 10*3/uL (ref 150.0–400.0)
RBC: 5.2 Mil/uL (ref 4.22–5.81)
RDW: 13.3 % (ref 11.5–15.5)
WBC: 5.6 10*3/uL (ref 4.0–10.5)

## 2024-01-18 LAB — LIPID PANEL
Cholesterol: 182 mg/dL (ref 0–200)
HDL: 61.6 mg/dL (ref >39.00)
LDL Cholesterol: 88 mg/dL (ref 0–99)
NonHDL: 120.22
Total CHOL/HDL Ratio: 3
Triglycerides: 160 mg/dL — ABNORMAL HIGH (ref 0.0–149.0)
VLDL: 32 mg/dL (ref 0.0–40.0)

## 2024-01-18 LAB — COMPREHENSIVE METABOLIC PANEL
ALT: 18 U/L (ref 0–53)
AST: 24 U/L (ref 0–37)
Albumin: 4.8 g/dL (ref 3.5–5.2)
Alkaline Phosphatase: 64 U/L (ref 39–117)
BUN: 16 mg/dL (ref 6–23)
CO2: 21 meq/L (ref 19–32)
Calcium: 10.6 mg/dL — ABNORMAL HIGH (ref 8.4–10.5)
Chloride: 103 meq/L (ref 96–112)
Creatinine, Ser: 1.1 mg/dL (ref 0.40–1.50)
GFR: 62.51 mL/min (ref >60.00)
Glucose, Bld: 122 mg/dL — ABNORMAL HIGH (ref 70–99)
Potassium: 4.6 meq/L (ref 3.5–5.1)
Sodium: 143 meq/L (ref 135–145)
Total Bilirubin: 0.7 mg/dL (ref 0.2–1.2)
Total Protein: 7.7 g/dL (ref 6.0–8.3)

## 2024-01-18 LAB — PSA: PSA: 0 ng/mL — ABNORMAL LOW (ref 0.10–4.00)

## 2024-01-18 LAB — TSH: TSH: 2.33 u[IU]/mL (ref 0.35–5.50)

## 2024-01-18 LAB — VITAMIN B12: Vitamin B-12: <50 pg/mL

## 2024-01-18 LAB — URIC ACID: Uric Acid, Serum: 7.8 mg/dL (ref 4.0–7.8)

## 2024-01-18 LAB — HEMOGLOBIN A1C: Hgb A1c MFr Bld: 5.7 % (ref 4.6–6.5)

## 2024-01-18 LAB — VITAMIN D 25 HYDROXY (VIT D DEFICIENCY, FRACTURES): VITD: 32.24 ng/mL (ref 30.00–100.00)

## 2024-01-18 MED ORDER — LOVASTATIN 40 MG PO TABS: 40.0000 mg | ORAL_TABLET | Freq: Every day | ORAL | 3 refills | Status: DC

## 2024-01-18 MED ORDER — LISINOPRIL 20 MG PO TABS: 20.0000 mg | ORAL_TABLET | Freq: Every day | ORAL | 3 refills | Status: DC

## 2024-01-18 MED ORDER — LISINOPRIL 10 MG PO TABS: ORAL_TABLET | ORAL | 3 refills | Status: DC

## 2024-01-18 NOTE — Telephone Encounter (Signed)
 Patient called to remind of upcoming ETT on 01/21/24 at 2:30

## 2024-01-18 NOTE — Assessment & Plan Note (Signed)
 BP mildly high today but at goal at home. Continue lisinopril 30 mg daily. Checking CMP and CBC. EKG done and stable. Ordered stress test for screening.

## 2024-01-18 NOTE — Assessment & Plan Note (Signed)
 Checking uric acid and adjust as needed. No flare. Not on controller meds.

## 2024-01-18 NOTE — Assessment & Plan Note (Signed)
 Flu shot up to date. Pneumonia complete. Shingrix complete. Tetanus up to date. Colonoscopy aged out. Counseled about sun safety and mole surveillance. Counseled about the dangers of distracted driving. Given 10 year screening recommendations.

## 2024-01-18 NOTE — Progress Notes (Signed)
   Subjective:   Patient ID: Troy Jenkins, male    DOB: 22-Nov-1940, 83 y.o.   MRN: [id number]  HPI The patient is here for physical. New symptoms of headache and weakness with working outside 2 days ago. Some nausea today. A little fatigue in last 2 days only.   PMH, [facility], social history reviewed and updated  Review of Systems  Constitutional:  Positive for fatigue.  HENT: Negative.    Eyes: Negative.   Respiratory:  Negative for cough, chest tightness and shortness of breath.   Cardiovascular:  Negative for chest pain, palpitations and leg swelling.  Gastrointestinal:  Negative for abdominal distention, abdominal pain, constipation, diarrhea, nausea and vomiting.  Musculoskeletal: Negative.   Skin: Negative.   Neurological: Negative.   Psychiatric/Behavioral: Negative.      Objective:  Physical Exam Constitutional:      Appearance: He is well-developed.  HENT:     Head: Normocephalic and atraumatic.  Cardiovascular:     Rate and Rhythm: Normal rate and regular rhythm.  Pulmonary:     Effort: Pulmonary effort is normal. No respiratory distress.     Breath sounds: Normal breath sounds. No wheezing or rales.  Abdominal:     General: Bowel sounds are normal. There is no distension.     Palpations: Abdomen is soft.     Tenderness: There is no abdominal tenderness. There is no rebound.  Musculoskeletal:     Cervical back: Normal range of motion.  Skin:    General: Skin is warm and dry.  Neurological:     Mental Status: He is alert and oriented to person, place, and time.     Coordination: Coordination normal.     Vitals:   01/18/24 0802 01/18/24 0804  BP: (!) 160/80 (!) 160/80  Pulse: 72   Temp: 97.9 F (36.6 C)   TempSrc: Oral   SpO2: 98%   Weight: 175 lb (79.4 kg)   Height: 5\' 10"  (1.778 m)    EKG: Rate 91, axis normal, interval 1st degree block new, sinus, no st or t wave changes, new 1st degree block since 2023 not significant  Assessment & Plan:

## 2024-01-18 NOTE — Assessment & Plan Note (Signed)
 Checking TSH and B12 and vitamin D and CBC and CMP and adjust as needed.

## 2024-01-18 NOTE — Assessment & Plan Note (Signed)
Checking lipid panel and adjust lovastatin as needed.  

## 2024-01-18 NOTE — Patient Instructions (Signed)
 The EKG is normal and we will do the stress test. We will check the labs today.

## 2024-01-18 NOTE — Assessment & Plan Note (Signed)
Checking PSA and adjust as needed. 

## 2024-01-21 ENCOUNTER — Ambulatory Visit (INDEPENDENT_AMBULATORY_CARE_PROVIDER_SITE_OTHER)

## 2024-01-21 ENCOUNTER — Ambulatory Visit (HOSPITAL_COMMUNITY): Attending: Internal Medicine

## 2024-01-21 ENCOUNTER — Ambulatory Visit: Payer: Self-pay | Admitting: Internal Medicine

## 2024-01-21 ENCOUNTER — Encounter: Payer: Self-pay | Admitting: Internal Medicine

## 2024-01-21 DIAGNOSIS — E538 Deficiency of other specified B group vitamins: Secondary | ICD-10-CM

## 2024-01-21 DIAGNOSIS — I10 Essential (primary) hypertension: Secondary | ICD-10-CM | POA: Insufficient documentation

## 2024-01-21 MED ORDER — CYANOCOBALAMIN 1000 MCG/ML IJ SOLN
1000.0000 ug | Freq: Once | INTRAMUSCULAR | Status: AC
  Administered 2024-01-21: 1000 ug via INTRAMUSCULAR

## 2024-01-21 NOTE — Telephone Encounter (Signed)
  Chief Complaint: Should pt have his stress test today or reschedule? Symptoms: none Frequency: today Pertinent Negatives: Patient denies No s/s Disposition: [] ED /[] Urgent Care (no appt availability in office) / [] Appointment(In office/virtual)/ []  [place] [facility]/ [] Home Care/ [] Refused Recommended Disposition /[] [place] [facility]/ [x]  Follow-up with PCP Additional Notes: Pt is wondering if he should have his stress test today or reschedule because of his low B12 level. Test is scheduled for this afternoon. Please return pt's call with recommendation.    Copied from CRM [id number]. Topic: Clinical - Medical Advice >> Jan 21, 2024 11:02 AM Florestine Avers wrote: Reason for CRM: Patient is scheduled for a stress test but he said his vitamin b12 levels are really low, and he wants to know if he should reschedule the stress test or go to it still. Patient appointment is for today and is requesting a urgent call back incase he needs to reschedule his appointment. Reason for Disposition  [1] Caller requesting NON-URGENT health information AND [2] PCP's office is the best resource  Answer Assessment - Initial Assessment Questions 1. REASON FOR CALL or QUESTION: "What is your reason for calling today?" or "How can I best help you?" or "What question do you have that I can help answer?"     Pt is wondering if he should have stress test today, or re-schedule because of his B12 being so low.  Protocols used: Information Only Call - No Triage-A-AH

## 2024-01-21 NOTE — Progress Notes (Signed)
 Patient visits today for their b-12 injection. Patient informed of what they had received and tolerated injection well. Patient notified to reach out if needed.

## 2024-01-21 NOTE — Telephone Encounter (Signed)
 He does not need to reschedule stress test B12 level will not affect this test.

## 2024-01-22 LAB — EXERCISE TOLERANCE TEST
Estimated workload: 4.6
Exercise duration (min): 3 min
Exercise duration (sec): 1 s
MPHR: 142 {beats}/min
Peak HR: 142 {beats}/min
Percent HR: 102 %
Rest HR: 92 {beats}/min
ST Depression (mm): 0 mm

## 2024-01-23 ENCOUNTER — Encounter: Payer: Self-pay | Admitting: Internal Medicine

## 2024-01-24 NOTE — Telephone Encounter (Signed)
 Noted as this was addressed as well in another encounter

## 2024-01-28 ENCOUNTER — Other Ambulatory Visit: Payer: Self-pay | Admitting: Internal Medicine

## 2024-01-28 ENCOUNTER — Encounter: Payer: Self-pay | Admitting: Internal Medicine

## 2024-01-28 ENCOUNTER — Telehealth: Payer: Self-pay | Admitting: Internal Medicine

## 2024-01-28 NOTE — Telephone Encounter (Unsigned)
 Copied from CRM [id number]. Topic: Clinical - Medication Question >> Jan 28, 2024 11:49 AM Fredrich Romans wrote: Reason for CRM: Patient would like to know if he could have b12 prescription sent to [facility] [address] [place] - [place] [facility] [address]  Phone: [phone] Fax: [phone]  He said that Dr Okey Dupre office is a hour away from his home both ways,and he would like to know if he could just give the injections to hisself if he's able to get prescription from pharmacy?

## 2024-01-29 ENCOUNTER — Other Ambulatory Visit: Payer: Self-pay | Admitting: Internal Medicine

## 2024-01-29 MED ORDER — CYANOCOBALAMIN 1000 MCG/ML IJ SOLN: 1000.0000 ug | Freq: Once | INTRAMUSCULAR | Status: DC

## 2024-01-29 MED ORDER — BD DISP NEEDLES 25G X 5/8" MISC: 0 refills | Status: AC

## 2024-01-29 NOTE — Telephone Encounter (Signed)
 Ok to do. Rx for B12 1000 mg/ml 1mL weekly 4 injections and syringes with 25 g needle please let me know if you have questions or need help

## 2024-01-29 NOTE — Telephone Encounter (Signed)
 Done

## 2024-01-29 NOTE — Addendum Note (Signed)
 Addended by: Levonne Lapping on: 01/29/2024 04:16 PM   Modules accepted: Orders

## 2024-01-29 NOTE — Telephone Encounter (Signed)
 Is this ok to do?

## 2024-01-29 NOTE — Telephone Encounter (Signed)
 Copied from CRM [id number]. Topic: Clinical - Medication Refill >> Jan 29, 2024 11:27 AM Elizebeth Brooking wrote: Most Recent Primary Care Visit:  Provider: Particia Nearing  Department: [facility]  Visit Type: NURSE VISIT  Date: 01/21/2024  Medication: lovastatin (MEVACOR) 40 MG tablet lisinopril (ZESTRIL) 10 MG tablet  lisinopril (ZESTRIL) 20 MG tablet  Has the patient contacted their pharmacy? Yes (Agent: If no, request that the patient contact the pharmacy for the refill. If patient does not wish to contact the pharmacy document the reason why and proceed with request.) (Agent: If yes, when and what did the pharmacy advise?)  Is this the correct pharmacy for this prescription? Yes If no, delete pharmacy and type the correct one.  This is the patient's preferred pharmacy:    [facility]) - [place], [place] - [address] [facility] [address] [place] [address] [place] [address] Phone: [phone] Fax: [phone]  [facility] - [place], [place] - [address] [place] [address] Phone: [phone] Fax: [phone]   Has the prescription been filled recently? No  Is the patient out of the medication? Yes  Has the patient been seen for an appointment in the last year OR does the patient have an upcoming appointment? Yes  Can we respond through MyChart? Yes  Agent: Please be advised that Rx refills may take up to 3 business days. We ask that you follow-up with your pharmacy.

## 2024-01-30 MED ORDER — LISINOPRIL 20 MG PO TABS: 20.0000 mg | ORAL_TABLET | Freq: Every day | ORAL | 3 refills | Status: DC

## 2024-01-30 MED ORDER — LOVASTATIN 40 MG PO TABS: 40.0000 mg | ORAL_TABLET | Freq: Every day | ORAL | 3 refills | Status: DC

## 2024-01-30 MED ORDER — LISINOPRIL 10 MG PO TABS: ORAL_TABLET | ORAL | 3 refills | Status: DC

## 2024-01-31 ENCOUNTER — Ambulatory Visit (INDEPENDENT_AMBULATORY_CARE_PROVIDER_SITE_OTHER)

## 2024-01-31 DIAGNOSIS — E538 Deficiency of other specified B group vitamins: Secondary | ICD-10-CM | POA: Diagnosis not present

## 2024-01-31 MED ORDER — CYANOCOBALAMIN 1000 MCG/ML IJ SOLN
1000.0000 ug | Freq: Once | INTRAMUSCULAR | Status: AC
  Administered 2024-01-31: 1000 ug via INTRAMUSCULAR

## 2024-01-31 NOTE — Progress Notes (Signed)
 Patient visits today for their b-12 injection. Patient informed of what they had received and tolerated their injection well. Patient notified to reach out to office if needed.

## 2024-02-07 ENCOUNTER — Ambulatory Visit (INDEPENDENT_AMBULATORY_CARE_PROVIDER_SITE_OTHER)

## 2024-02-07 DIAGNOSIS — E538 Deficiency of other specified B group vitamins: Secondary | ICD-10-CM

## 2024-02-07 MED ORDER — CYANOCOBALAMIN 1000 MCG/ML IJ SOLN
1000.0000 ug | Freq: Once | INTRAMUSCULAR | Status: AC
  Administered 2024-02-07: 1000 ug via INTRAMUSCULAR

## 2024-02-07 NOTE — Progress Notes (Signed)
 Pt here for weekly B12 injection per Dr.Crawford  B12 given IM and pt tolerated injection well.  Next B12 injection scheduled with front desk on the way out.

## 2024-02-14 ENCOUNTER — Ambulatory Visit (INDEPENDENT_AMBULATORY_CARE_PROVIDER_SITE_OTHER)

## 2024-02-14 DIAGNOSIS — E538 Deficiency of other specified B group vitamins: Secondary | ICD-10-CM

## 2024-02-14 MED ORDER — CYANOCOBALAMIN 1000 MCG/ML IJ SOLN
1000.0000 ug | Freq: Once | INTRAMUSCULAR | Status: AC
  Administered 2024-02-14: 1000 ug via INTRAMUSCULAR

## 2024-02-14 NOTE — Progress Notes (Signed)
 Patient visits today for their b-12 injection. Patient informed of what they had received and tolerated injection well. Patient notified to reach out to office if needed.

## 2024-02-25 DIAGNOSIS — S92254A Nondisplaced fracture of navicular [scaphoid] of right foot, initial encounter for closed fracture: Secondary | ICD-10-CM | POA: Diagnosis not present

## 2024-02-25 DIAGNOSIS — S92001A Unspecified fracture of right calcaneus, initial encounter for closed fracture: Secondary | ICD-10-CM | POA: Diagnosis not present

## 2024-03-20 DIAGNOSIS — C4442 Squamous cell carcinoma of skin of scalp and neck: Secondary | ICD-10-CM | POA: Diagnosis not present

## 2024-03-22 ENCOUNTER — Other Ambulatory Visit: Payer: Self-pay | Admitting: Internal Medicine

## 2024-04-03 ENCOUNTER — Encounter: Payer: Self-pay | Admitting: Internal Medicine

## 2024-04-03 NOTE — Telephone Encounter (Signed)
Please advise as MD is out of office

## 2024-04-15 ENCOUNTER — Encounter: Payer: Self-pay | Admitting: Internal Medicine

## 2024-04-15 ENCOUNTER — Ambulatory Visit (INDEPENDENT_AMBULATORY_CARE_PROVIDER_SITE_OTHER): Admitting: Internal Medicine

## 2024-04-15 VITALS — BP 130/71 | HR 91 | Temp 98.0°F | Ht 70.0 in | Wt 178.0 lb

## 2024-04-15 DIAGNOSIS — M79605 Pain in left leg: Secondary | ICD-10-CM | POA: Diagnosis not present

## 2024-04-15 DIAGNOSIS — R5383 Other fatigue: Secondary | ICD-10-CM

## 2024-04-15 DIAGNOSIS — M79604 Pain in right leg: Secondary | ICD-10-CM

## 2024-04-15 DIAGNOSIS — E538 Deficiency of other specified B group vitamins: Secondary | ICD-10-CM

## 2024-04-15 NOTE — Progress Notes (Unsigned)
   Subjective:   Patient ID: Troy Jenkins, male    DOB: 12/30/1940, 83 y.o.   MRN: [id number]  HPI The patient is an 83 YO man coming in for concerns about pain in the legs for last couple of weeks. Started after he completed B12 shots. Denies other changes including diet, exercise, activity. This is improving gradually. Worse with movement. He is concerned about adequate blood flow to legs. Improves with rest.   Review of Systems  Constitutional: Negative.   HENT: Negative.    Eyes: Negative.   Respiratory:  Negative for cough, chest tightness and shortness of breath.   Cardiovascular:  Negative for chest pain, palpitations and leg swelling.  Gastrointestinal:  Negative for abdominal distention, abdominal pain, constipation, diarrhea, nausea and vomiting.  Musculoskeletal:  Positive for arthralgias and myalgias.  Skin: Negative.   Neurological: Negative.   Psychiatric/Behavioral: Negative.      Objective:  Physical Exam Constitutional:      Appearance: He is well-developed.  HENT:     Head: Normocephalic and atraumatic.  Cardiovascular:     Rate and Rhythm: Normal rate and regular rhythm.  Pulmonary:     Effort: Pulmonary effort is normal. No respiratory distress.     Breath sounds: Normal breath sounds. No wheezing or rales.  Abdominal:     General: Bowel sounds are normal. There is no distension.     Palpations: Abdomen is soft.     Tenderness: There is no abdominal tenderness. There is no rebound.  Musculoskeletal:        General: Tenderness present.     Cervical back: Normal range of motion.  Skin:    General: Skin is warm and dry.  Neurological:     Mental Status: He is alert and oriented to person, place, and time.     Coordination: Coordination normal.     Vitals:   04/15/24 0910 04/15/24 0916 04/15/24 0936  BP: (!) 160/80 (!) 160/80 130/71  Pulse: 91    Temp: 98 F (36.7 C)    TempSrc: Oral    SpO2: 99%    Weight: 178 lb (80.7 kg)    Height: 5\' 10"   (1.778 m)      Assessment & Plan:

## 2024-04-15 NOTE — Patient Instructions (Signed)
 We will check the vascular study of the legs.

## 2024-04-16 DIAGNOSIS — E538 Deficiency of other specified B group vitamins: Secondary | ICD-10-CM | POA: Insufficient documentation

## 2024-04-16 DIAGNOSIS — M79605 Pain in left leg: Secondary | ICD-10-CM | POA: Insufficient documentation

## 2024-04-16 DIAGNOSIS — M5416 Radiculopathy, lumbar region: Secondary | ICD-10-CM | POA: Insufficient documentation

## 2024-04-16 NOTE — Assessment & Plan Note (Signed)
 Completed 4 weekly B12 shots and got leg pain afterwards which I do not suspect is related. Does have improvement in energy since B12 shots and the muscle cramps.

## 2024-04-16 NOTE — Assessment & Plan Note (Signed)
 Checking ABI to assess vascular flow. If normal will pursue bilateral hip x-ray as pain seems to have started at both hips and radiates into the legs. This would be unusual for it to have started at once. I do not suspect that the B12 shots had anything to do with this although there is a temporal relationship.

## 2024-04-16 NOTE — Assessment & Plan Note (Signed)
 This is improved slightly with B12 replacement and will monitor closely.

## 2024-05-01 DIAGNOSIS — Z08 Encounter for follow-up examination after completed treatment for malignant neoplasm: Secondary | ICD-10-CM | POA: Diagnosis not present

## 2024-05-01 DIAGNOSIS — D0471 Carcinoma in situ of skin of right lower limb, including hip: Secondary | ICD-10-CM | POA: Diagnosis not present

## 2024-05-01 DIAGNOSIS — Z85828 Personal history of other malignant neoplasm of skin: Secondary | ICD-10-CM | POA: Diagnosis not present

## 2024-05-07 ENCOUNTER — Ambulatory Visit (HOSPITAL_COMMUNITY)
Admission: RE | Admit: 2024-05-07 | Discharge: 2024-05-07 | Disposition: A | Discharge status: Home / Self Care | Source: Ambulatory Visit | Attending: Internal Medicine | Admitting: Internal Medicine

## 2024-05-07 DIAGNOSIS — M79605 Pain in left leg: Secondary | ICD-10-CM

## 2024-05-07 DIAGNOSIS — M79604 Pain in right leg: Secondary | ICD-10-CM | POA: Diagnosis not present

## 2024-05-07 LAB — VAS US ABI WITH/WO TBI
Left ABI: 1.24
Right ABI: 1.19

## 2024-05-09 ENCOUNTER — Ambulatory Visit: Payer: Self-pay | Admitting: Internal Medicine

## 2024-07-07 ENCOUNTER — Telehealth: Payer: Self-pay | Admitting: Internal Medicine

## 2024-07-07 DIAGNOSIS — I6523 Occlusion and stenosis of bilateral carotid arteries: Secondary | ICD-10-CM

## 2024-07-07 NOTE — Telephone Encounter (Signed)
 Pt is requesting a callbackk from nurse regarding him getting a message about scheduling his US  THYROID . Please advise

## 2024-07-07 NOTE — Telephone Encounter (Signed)
 He reports that he gets a carotid doppler yearly due to plaque seen and the last time they checked his carotid artery and his thyroid  at the same time. He needs his carotid checked too, not just thyroid .   He just wanted to check and see if he can get the thyroid  and the carotid checked at same time to have one test instead of 2.   Informed him that I will send this message to his provider and we will be in touch soon.

## 2024-07-07 NOTE — Telephone Encounter (Signed)
 Was speaking with the patient and the line went silent- could not hear anything at all. Tried to call pt back and it said that the call cannot be completed as dialed.  Tried cell phone and went straight to voicemail. Left message with call back number.   Information from Thyroid  US  in 2024: IMPRESSION: 1. 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 Assuming a previously benign biopsy result, no further follow-up is recommended. 2. 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 Such minimal change over 4 years is reassuring. However, continued imaging surveillance is recommended. Recommend follow-up ultrasound in 1 year.  Repeat US  ordered

## 2024-07-08 NOTE — Telephone Encounter (Signed)
 Mona Vinie BROCKS, MD to Me  (Selected Message)     07/08/24 12:52 PM These are different tests - he needs to have 2 studies since they are focusing on different things - it was only incidentally noted on the carotid doppler that he had thyroid  nodules in the past.   Please order both carotid dopplers and dedicated thyroid  ultrasound for follow-up.   Dr. Mona  Left all of this information over voicemail. Left call back number. Informed that the carotid doppler has been ordered and he will receive a call to schedule this. The thyroid  u/s has already been ordered and they will call to schedule this as well.

## 2024-07-17 ENCOUNTER — Ambulatory Visit (HOSPITAL_BASED_OUTPATIENT_CLINIC_OR_DEPARTMENT_OTHER)
Admission: RE | Admit: 2024-07-17 | Discharge: 2024-07-17 | Disposition: A | Discharge status: Home / Self Care | Source: Ambulatory Visit | Attending: Internal Medicine | Admitting: Internal Medicine

## 2024-07-17 DIAGNOSIS — E041 Nontoxic single thyroid nodule: Secondary | ICD-10-CM | POA: Diagnosis not present

## 2024-07-17 DIAGNOSIS — E042 Nontoxic multinodular goiter: Secondary | ICD-10-CM | POA: Diagnosis not present

## 2024-07-18 ENCOUNTER — Ambulatory Visit (HOSPITAL_COMMUNITY)
Admission: RE | Admit: 2024-07-18 | Discharge: 2024-07-18 | Disposition: A | Discharge status: Home / Self Care | Source: Ambulatory Visit | Attending: Internal Medicine | Admitting: Internal Medicine

## 2024-07-18 DIAGNOSIS — I6523 Occlusion and stenosis of bilateral carotid arteries: Secondary | ICD-10-CM | POA: Diagnosis not present

## 2024-07-29 ENCOUNTER — Ambulatory Visit: Payer: Self-pay | Admitting: Internal Medicine

## 2024-07-29 DIAGNOSIS — I6523 Occlusion and stenosis of bilateral carotid arteries: Secondary | ICD-10-CM

## 2024-07-29 NOTE — Progress Notes (Signed)
 MyChart message containing providers result note and interpretation read by patient on: Last read by Lamar LELON Theo Octaviano at 10:52AM on 07/29/2024.

## 2024-08-05 ENCOUNTER — Telehealth: Payer: Self-pay

## 2024-08-05 NOTE — Telephone Encounter (Signed)
 Copied from CRM #[id number]. Topic: Clinical - Medical Advice >> Aug 05, 2024  9:25 AM Precious C wrote: Reason for CRM: Pt in regards to a question, pt would if his provider has access to his [facility] records, and if not he he can provide a copy. Pt ststed he recently had a MRI done and would like his PCP to have access to it. Pt would like message on MyChart to follow up with this question.

## 2024-08-14 ENCOUNTER — Telehealth: Payer: Self-pay

## 2024-08-15 NOTE — Telephone Encounter (Signed)
 FYI I did read it and it doesn't look like it was looking for answers or anything to be addressed

## 2024-08-15 NOTE — Telephone Encounter (Signed)
 Is this fyi or is there a question about results?

## 2024-08-18 DIAGNOSIS — D485 Neoplasm of uncertain behavior of skin: Secondary | ICD-10-CM | POA: Diagnosis not present

## 2024-08-18 DIAGNOSIS — Z1283 Encounter for screening for malignant neoplasm of skin: Secondary | ICD-10-CM | POA: Diagnosis not present

## 2024-08-18 DIAGNOSIS — D2271 Melanocytic nevi of right lower limb, including hip: Secondary | ICD-10-CM | POA: Diagnosis not present

## 2024-08-18 DIAGNOSIS — D225 Melanocytic nevi of trunk: Secondary | ICD-10-CM | POA: Diagnosis not present

## 2024-08-18 DIAGNOSIS — Z85828 Personal history of other malignant neoplasm of skin: Secondary | ICD-10-CM | POA: Diagnosis not present

## 2024-08-18 DIAGNOSIS — Z08 Encounter for follow-up examination after completed treatment for malignant neoplasm: Secondary | ICD-10-CM | POA: Diagnosis not present

## 2024-08-18 DIAGNOSIS — X32XXXD Exposure to sunlight, subsequent encounter: Secondary | ICD-10-CM | POA: Diagnosis not present

## 2024-08-18 DIAGNOSIS — L570 Actinic keratosis: Secondary | ICD-10-CM | POA: Diagnosis not present

## 2024-10-07 ENCOUNTER — Other Ambulatory Visit: Payer: Self-pay | Admitting: Internal Medicine

## 2024-11-24 ENCOUNTER — Ambulatory Visit: Admitting: Internal Medicine

## 2024-11-24 VITALS — BP 163/79 | HR 95 | Temp 97.9°F | Ht 70.0 in | Wt 175.6 lb

## 2024-11-24 DIAGNOSIS — U071 COVID-19: Secondary | ICD-10-CM

## 2024-11-24 DIAGNOSIS — M5416 Radiculopathy, lumbar region: Secondary | ICD-10-CM

## 2024-11-24 MED ORDER — FLUTICASONE PROPIONATE 50 MCG/ACT NA SUSP: 2.0000 | Freq: Every day | NASAL | 6 refills | Status: AC

## 2024-11-24 NOTE — Progress Notes (Signed)
 "  Subjective:   Patient ID: JAKHARI SPACE, male    DOB: 08-31-1941, 84 y.o.   MRN: [id number]  Discussed the use of AI scribe software for clinical note transcription with the patient, who gave verbal consent to proceed.  History of Present Illness Sreekar Broyhill is an 84 year old male who presents for follow-up after a recent COVID-19 diagnosis.  Approximately two weeks ago, he was diagnosed with COVID-19 at an urgent care facility and was prescribed Paxlovid, which he completed last Wednesday or Thursday. He never experienced a fever but had significant fatigue and nasal drainage. The drainage had almost resolved but has recently worsened again. He is not currently taking any medication for the drainage. No new breathing problems, cough, or sputum production. No weakness or additional pain beyond what was previously mentioned.  About a month ago, he experienced significant pain in his side while playing golf, which he attributes to a possible muscle strain. The pain has improved significantly over the last few days without specific treatment.  He has ongoing issues with leg pain, which is the reason he was scheduled for back injections. His leg pain was severe in the mornings, especially after lifting heavy objects, but has improved recently. He has an MRI scan that was recommended by a doctor at the [facility], which he brought to the appointment.  Review of Systems  Constitutional: Negative.  Negative for activity change, appetite change and fatigue.  HENT:  Positive for postnasal drip and rhinorrhea.   Eyes: Negative.   Respiratory:  Negative for cough, chest tightness and shortness of breath.   Cardiovascular:  Negative for chest pain, palpitations and leg swelling.  Gastrointestinal:  Negative for abdominal distention, abdominal pain, constipation, diarrhea, nausea and vomiting.  Musculoskeletal: Negative.   Skin: Negative.   Neurological: Negative.   Psychiatric/Behavioral:  Negative.      Objective:  Physical Exam Constitutional:      Appearance: He is well-developed.  HENT:     Head: Normocephalic and atraumatic.     Nose: Rhinorrhea present.     Mouth/Throat:     Pharynx: No oropharyngeal exudate or posterior oropharyngeal erythema.  Cardiovascular:     Rate and Rhythm: Normal rate and regular rhythm.  Pulmonary:     Effort: Pulmonary effort is normal. No respiratory distress.     Breath sounds: Normal breath sounds. No wheezing or rales.  Abdominal:     General: Bowel sounds are normal. There is no distension.     Palpations: Abdomen is soft.     Tenderness: There is no abdominal tenderness.  Musculoskeletal:     Cervical back: Normal range of motion.  Skin:    General: Skin is warm and dry.  Neurological:     Mental Status: He is alert and oriented to person, place, and time.     Coordination: Coordination normal.     Vitals:   11/24/24 1506 11/24/24 1507  BP: (!) 172/90 (!) 163/79  Pulse: 95   Temp: 97.9 F (36.6 C)   SpO2: 97%   Weight: 175 lb 9.6 oz (79.7 kg)   Height: 5' 10 (1.778 m)    Assessment and Plan Assessment & Plan COVID-19 infection   Diagnosed two weeks ago. Symptoms improved with Paxlovid. Nasal drainage persists but is expected to resolve in 1-2 weeks. No pneumonia or bronchitis present. He is not contagious. Rx Flonase  nasal spray, two sprays each nostril once daily. Consider antihistamines like Zyrtec, Claritin, or Allegra. Advised to  wear a mask in public for 1-2 weeks.  Lumbar radiculopathy   Chronic leg pain has improved with rest. Lumbar injections are scheduled for February 2nd and deemed safe following an MRI. Proceed with lumbar injections on February 2nd. Continue to avoid heavy lifting.   "

## 2024-11-24 NOTE — Patient Instructions (Signed)
 We have sent in flonase  to use in the nose for the drainage. Use 2 sprays in each nostril daily for the next 1-2 weeks.

## 2024-12-08 ENCOUNTER — Other Ambulatory Visit: Payer: Self-pay | Admitting: Internal Medicine

## 2025-01-02 ENCOUNTER — Ambulatory Visit: Payer: Medicare Other
